# Patient Record
Sex: Female | Born: 1960 | Race: White | Hispanic: No | Marital: Married | Smoking: Former smoker
Health system: Southern US, Community
[De-identification: ages and names within clinical notes are randomized; demographics above are authoritative.]

## PROBLEM LIST (undated history)

## (undated) DIAGNOSIS — M1990 Unspecified osteoarthritis, unspecified site: Secondary | ICD-10-CM

## (undated) DIAGNOSIS — Z8739 Personal history of other diseases of the musculoskeletal system and connective tissue: Secondary | ICD-10-CM

## (undated) DIAGNOSIS — Z87442 Personal history of urinary calculi: Secondary | ICD-10-CM

## (undated) DIAGNOSIS — Z1501 Genetic susceptibility to malignant neoplasm of breast: Secondary | ICD-10-CM

## (undated) DIAGNOSIS — K5900 Constipation, unspecified: Secondary | ICD-10-CM

## (undated) DIAGNOSIS — Z806 Family history of leukemia: Secondary | ICD-10-CM

## (undated) DIAGNOSIS — F419 Anxiety disorder, unspecified: Secondary | ICD-10-CM

## (undated) DIAGNOSIS — Z8719 Personal history of other diseases of the digestive system: Secondary | ICD-10-CM

## (undated) DIAGNOSIS — Z1589 Genetic susceptibility to other disease: Secondary | ICD-10-CM

## (undated) DIAGNOSIS — G5600 Carpal tunnel syndrome, unspecified upper limb: Secondary | ICD-10-CM

## (undated) DIAGNOSIS — C801 Malignant (primary) neoplasm, unspecified: Secondary | ICD-10-CM

## (undated) DIAGNOSIS — Z800 Family history of malignant neoplasm of digestive organs: Secondary | ICD-10-CM

## (undated) HISTORY — DX: Constipation, unspecified: K59.00

## (undated) HISTORY — DX: Family history of malignant neoplasm of digestive organs: Z80.0

## (undated) HISTORY — DX: Anxiety disorder, unspecified: F41.9

## (undated) HISTORY — DX: Genetic susceptibility to other disease: Z15.89

## (undated) HISTORY — PX: FRACTURE SURGERY: SHX138

## (undated) HISTORY — DX: Personal history of other diseases of the musculoskeletal system and connective tissue: Z87.39

## (undated) HISTORY — DX: Family history of leukemia: Z80.6

## (undated) HISTORY — DX: Personal history of other diseases of the digestive system: Z87.19

## (undated) HISTORY — PX: OTHER SURGICAL HISTORY: SHX169

## (undated) HISTORY — DX: Genetic susceptibility to malignant neoplasm of breast: Z15.01

## (undated) HISTORY — PX: TUBAL LIGATION: SHX77

## (undated) HISTORY — PX: COLONOSCOPY: SHX174

---

## 1997-09-14 ENCOUNTER — Other Ambulatory Visit: Admission: RE | Admit: 1997-09-14 | Discharge: 1997-09-14 | Payer: Self-pay | Admitting: *Deleted

## 1998-04-09 ENCOUNTER — Other Ambulatory Visit: Admission: RE | Admit: 1998-04-09 | Discharge: 1998-04-09 | Payer: Self-pay | Admitting: *Deleted

## 1998-05-22 HISTORY — PX: LITHOTRIPSY: SUR834

## 1998-10-26 ENCOUNTER — Other Ambulatory Visit: Admission: RE | Admit: 1998-10-26 | Discharge: 1998-10-26 | Payer: Self-pay | Admitting: *Deleted

## 1998-12-16 ENCOUNTER — Ambulatory Visit (HOSPITAL_COMMUNITY): Admission: RE | Admit: 1998-12-16 | Discharge: 1998-12-16 | Payer: Self-pay | Admitting: *Deleted

## 1998-12-16 ENCOUNTER — Encounter (INDEPENDENT_AMBULATORY_CARE_PROVIDER_SITE_OTHER): Payer: Self-pay | Admitting: Specialist

## 1999-12-07 ENCOUNTER — Other Ambulatory Visit: Admission: RE | Admit: 1999-12-07 | Discharge: 1999-12-07 | Payer: Self-pay | Admitting: *Deleted

## 2000-12-10 ENCOUNTER — Other Ambulatory Visit: Admission: RE | Admit: 2000-12-10 | Discharge: 2000-12-10 | Payer: Self-pay | Admitting: *Deleted

## 2001-02-22 ENCOUNTER — Other Ambulatory Visit: Admission: RE | Admit: 2001-02-22 | Discharge: 2001-02-22 | Payer: Self-pay | Admitting: *Deleted

## 2001-02-22 ENCOUNTER — Encounter (INDEPENDENT_AMBULATORY_CARE_PROVIDER_SITE_OTHER): Payer: Self-pay | Admitting: Specialist

## 2001-04-21 ENCOUNTER — Inpatient Hospital Stay (HOSPITAL_COMMUNITY): Admission: EM | Admit: 2001-04-21 | Discharge: 2001-04-22 | Payer: Self-pay

## 2001-04-21 ENCOUNTER — Encounter (INDEPENDENT_AMBULATORY_CARE_PROVIDER_SITE_OTHER): Payer: Self-pay | Admitting: *Deleted

## 2001-12-10 ENCOUNTER — Other Ambulatory Visit: Admission: RE | Admit: 2001-12-10 | Discharge: 2001-12-10 | Payer: Self-pay | Admitting: *Deleted

## 2002-10-21 ENCOUNTER — Encounter: Admission: RE | Admit: 2002-10-21 | Discharge: 2002-10-29 | Payer: Self-pay | Admitting: Internal Medicine

## 2002-10-27 ENCOUNTER — Encounter: Payer: Self-pay | Admitting: Family Medicine

## 2002-10-27 ENCOUNTER — Encounter: Admission: RE | Admit: 2002-10-27 | Discharge: 2002-10-27 | Payer: Self-pay | Admitting: Family Medicine

## 2003-05-17 ENCOUNTER — Inpatient Hospital Stay (HOSPITAL_COMMUNITY): Admission: AD | Admit: 2003-05-17 | Discharge: 2003-05-18 | Payer: Self-pay | Admitting: *Deleted

## 2003-05-17 IMAGING — US US TRANSVAGINAL NON-OB
1 series · 18 of 25 positions shown · non-contrast
Comparison: none

CLINICAL DATA: Right-sided pain.  History of appendectomy, myomectomy.  History of renal calculi.
 RENAL ULTRASOUND, TRANSABDOMINAL AND TRANSVAGINAL PELVIC ULTRASOUND, [DATE]
 No comparison films available.
 RENAL ULTRASOUND
 Kidneys bilaterally are normal in size and echogenicity without focal solid masses.  There is mild to moderate right hydronephrosis with a few small right intrarenal nonobstructing calculi.  The cause of this hydronephrosis not delineated but may be from a distal ureteral calculus.
 IMPRESSION 
 Mild to moderate right hydronephrosis cause not delineated by this examination.  Consider further evaluation with CT or other imaging or evaluation as indicated.
 Small right intrarenal calculi.
 PELVIC ULTRASOUND
 Transvaginal and transabdominal ultrasound of the pelvis is performed.  The uterus is retroverted measuring 9.2 x 4.4 x 5.4 cm.  No focal uterine masses are identified except for several nabothian cysts.  Endometrial stripe is homogeneous measuring 6 mm in greatest diameter.  Ovaries bilaterally are unremarkable.  Blood flow seen to both ovaries.  No solid adnexal masses or free fluid.
 Retroverted uterus.  Several nabothian cysts.  Otherwise unremarkable pelvic ultrasound.
 [REDACTED]

[Series 1: us transvaginal non-ob · 18 of 60 slices shown]
[im 1/60]
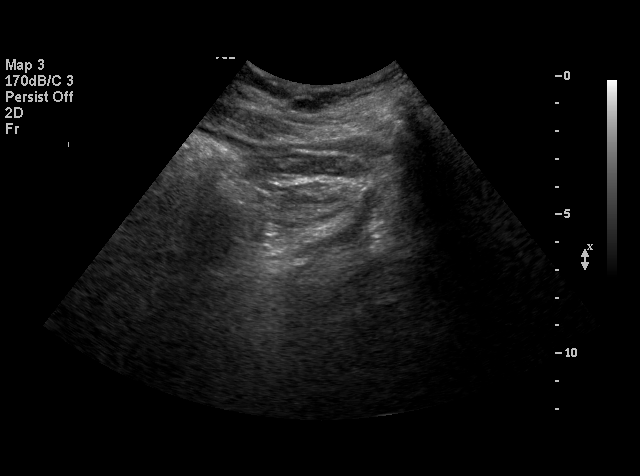
[im 5/60]
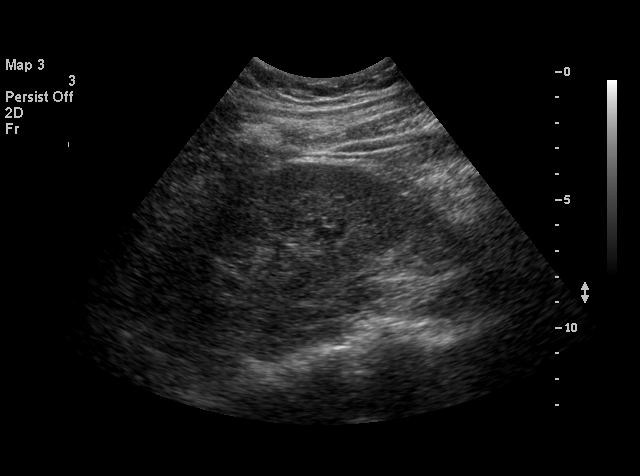
[im 8/60]
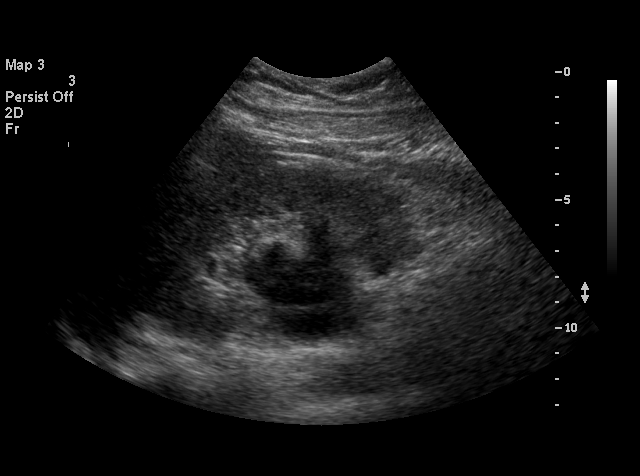
[im 10/60]
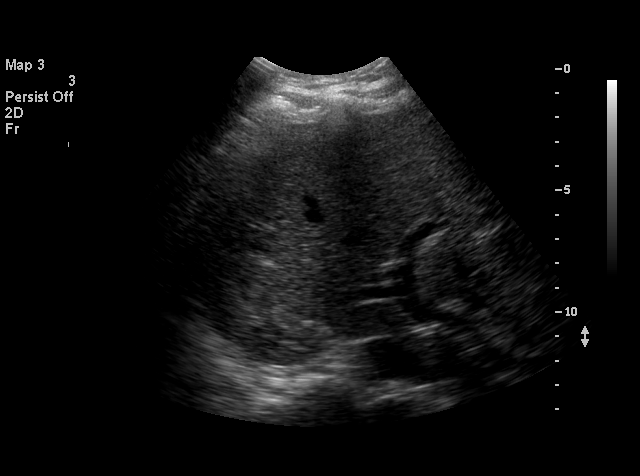
[im 15/60]
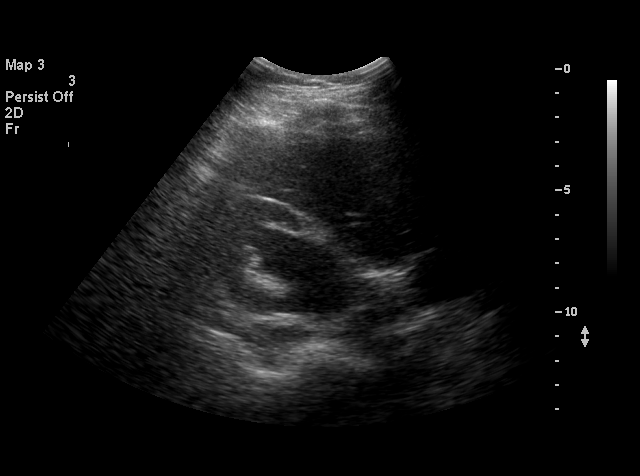
[im 18/60]
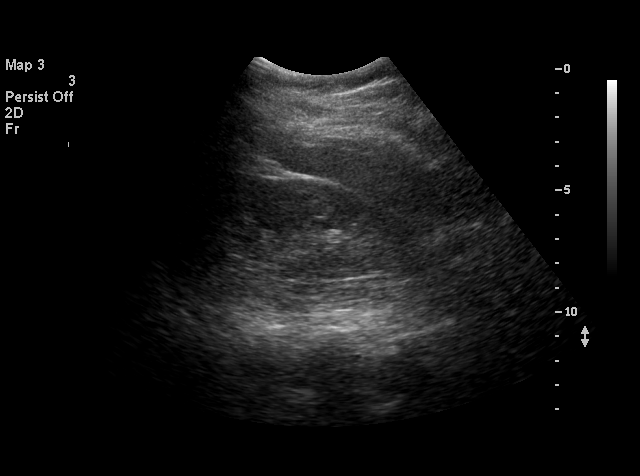
[im 23/60]
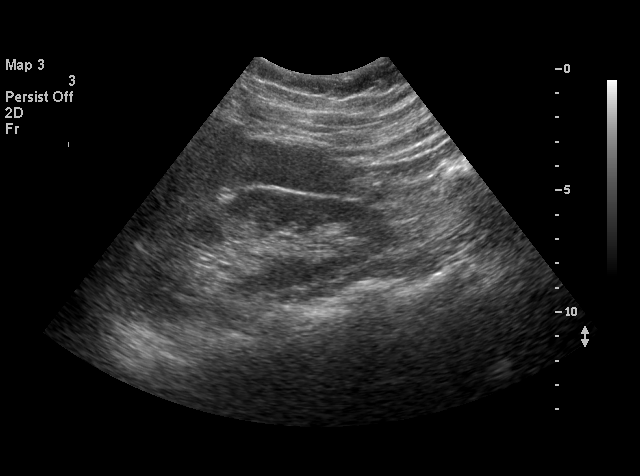
[im 25/60]
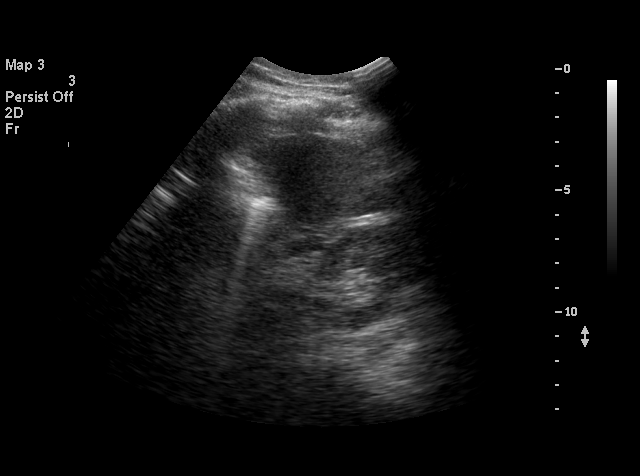
[im 28/60]
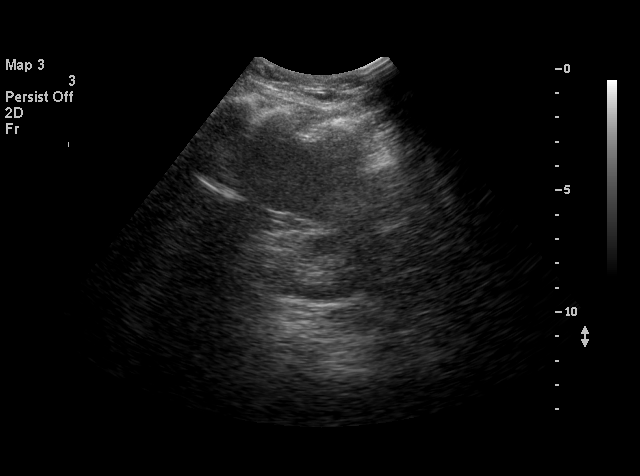
[im 32/60]
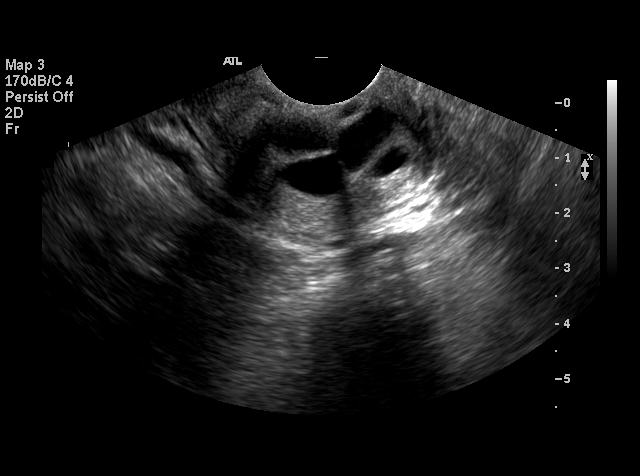
[im 35/60]
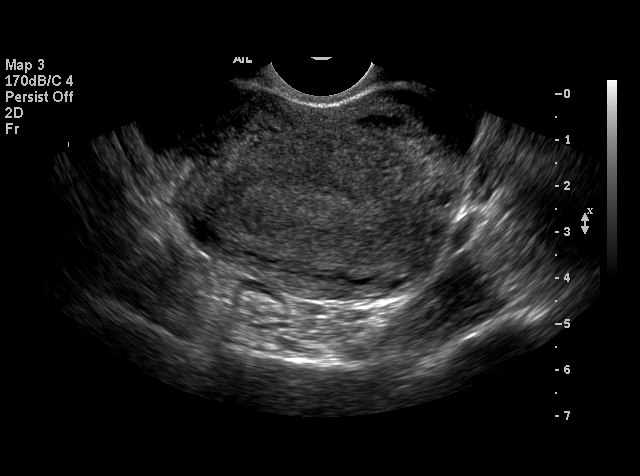
[im 37/60]
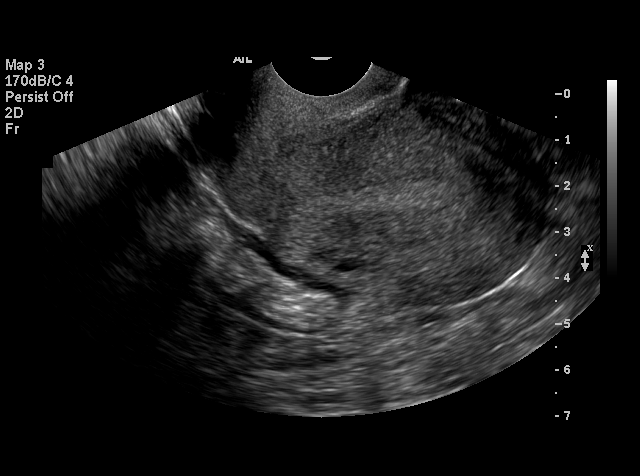
[im 42/60]
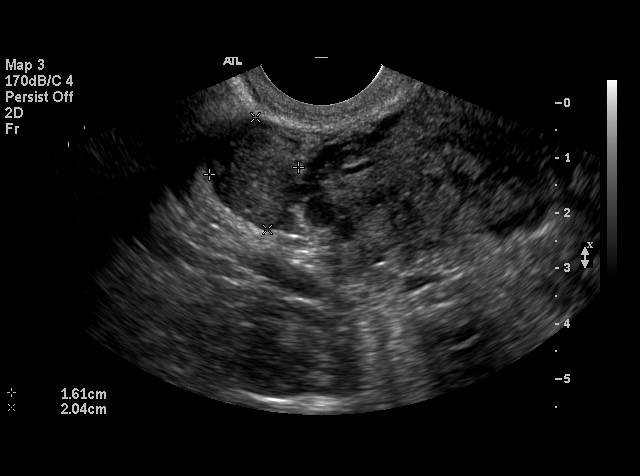
[im 45/60]
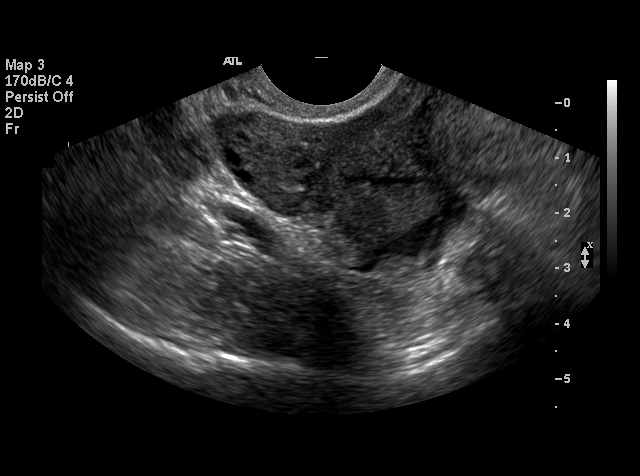
[im 50/60]
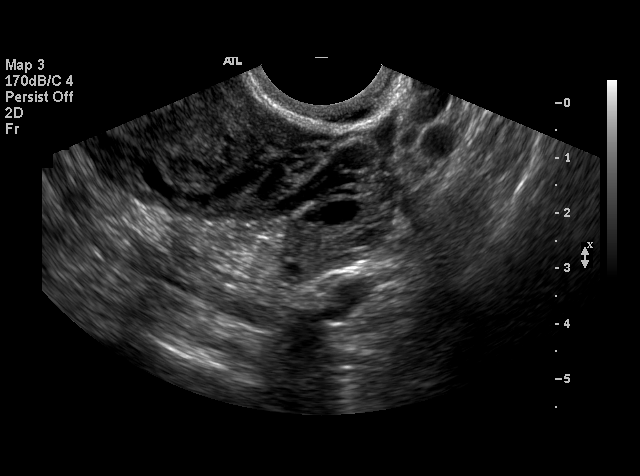
[im 52/60]
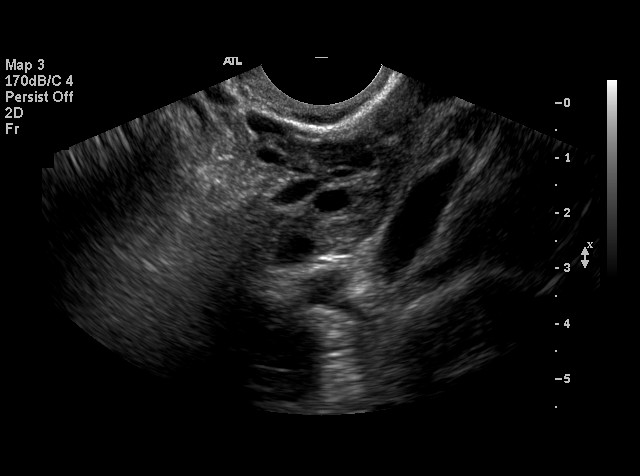
[im 55/60]
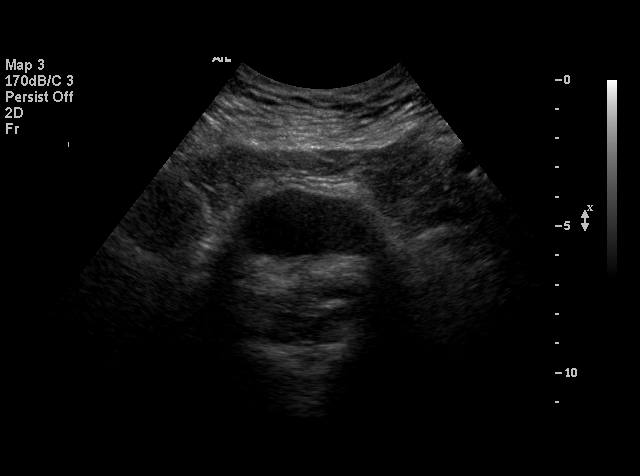
[im 60/60]
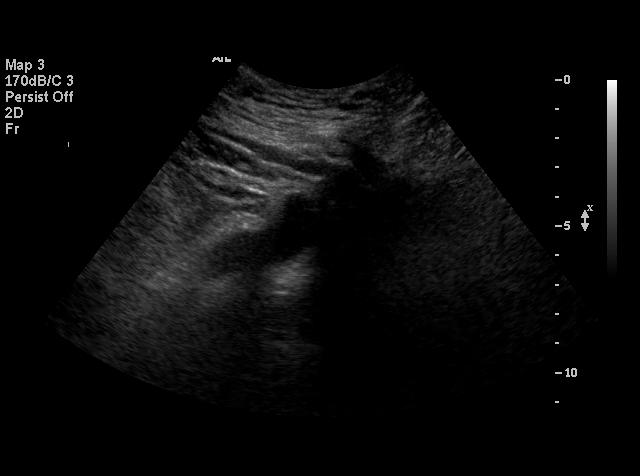

[18 of 25 positions shown; findings below may reference images not displayed]

## 2003-05-18 ENCOUNTER — Emergency Department (HOSPITAL_COMMUNITY): Admission: EM | Admit: 2003-05-18 | Discharge: 2003-05-18 | Payer: Self-pay | Admitting: Emergency Medicine

## 2003-05-19 ENCOUNTER — Observation Stay (HOSPITAL_COMMUNITY): Admission: AD | Admit: 2003-05-19 | Discharge: 2003-05-20 | Payer: Self-pay | Admitting: Urology

## 2003-06-08 ENCOUNTER — Other Ambulatory Visit: Admission: RE | Admit: 2003-06-08 | Discharge: 2003-06-08 | Payer: Self-pay | Admitting: *Deleted

## 2004-09-11 ENCOUNTER — Emergency Department (HOSPITAL_COMMUNITY): Admission: EM | Admit: 2004-09-11 | Discharge: 2004-09-11 | Payer: Self-pay | Admitting: Emergency Medicine
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
## 2004-10-26 ENCOUNTER — Encounter: Admission: RE | Admit: 2004-10-26 | Discharge: 2005-01-24 | Payer: Self-pay | Admitting: Orthopedic Surgery

## 2006-10-16 ENCOUNTER — Other Ambulatory Visit: Admission: RE | Admit: 2006-10-16 | Discharge: 2006-10-16 | Payer: Self-pay | Admitting: *Deleted

## 2007-01-18 ENCOUNTER — Ambulatory Visit (HOSPITAL_COMMUNITY): Admission: RE | Admit: 2007-01-18 | Discharge: 2007-01-18 | Payer: Self-pay | Admitting: Family Medicine

## 2009-12-28 ENCOUNTER — Encounter: Admission: RE | Admit: 2009-12-28 | Discharge: 2009-12-28 | Payer: Self-pay | Admitting: Family Medicine

## 2009-12-28 IMAGING — US US TRANSVAGINAL NON-OB
1 series · 13 of 25 positions shown · non-contrast
Comparison: Prior examination [DATE].

CLINICAL DATA: Dysfunctional uterine bleeding. History of uterine
fibroid.  LMP [DATE]; not on hormone replacement therapy.



[Series 1: us transvaginal non-ob · 0.33mm/px · 13 of 50 slices shown]
[im 1/50]
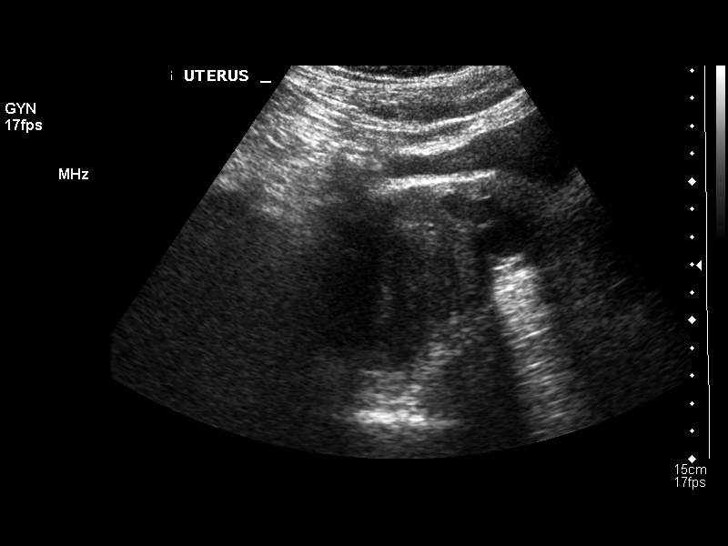
[im 5/50]
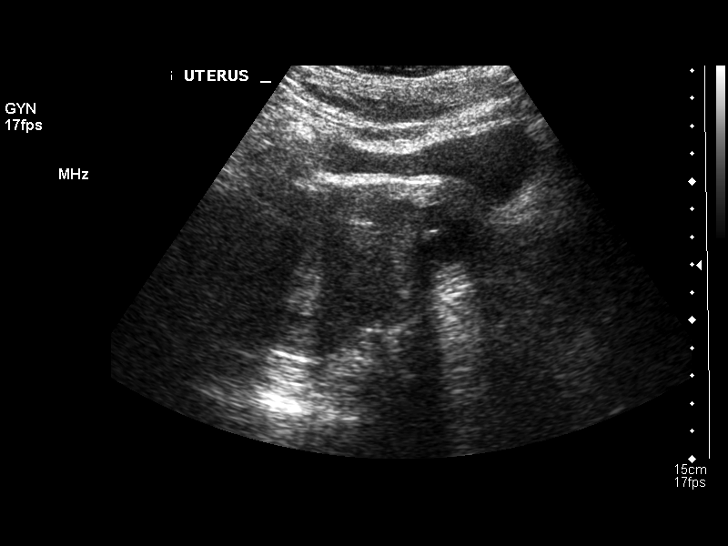
[im 9/50]
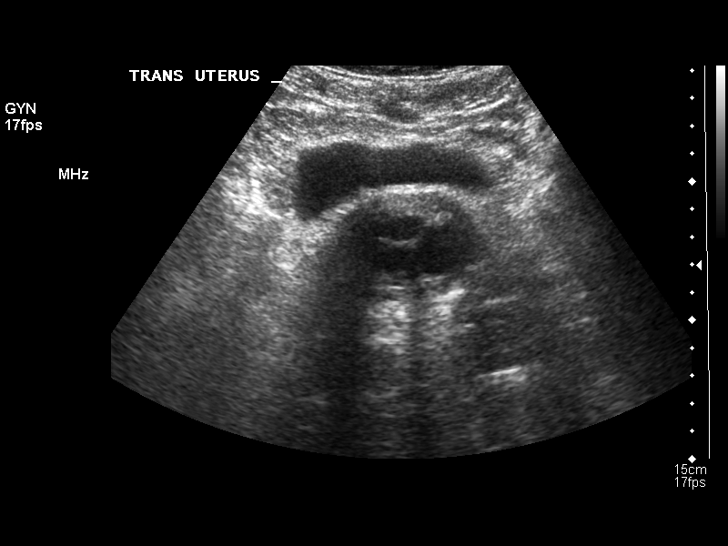
[im 13/50]
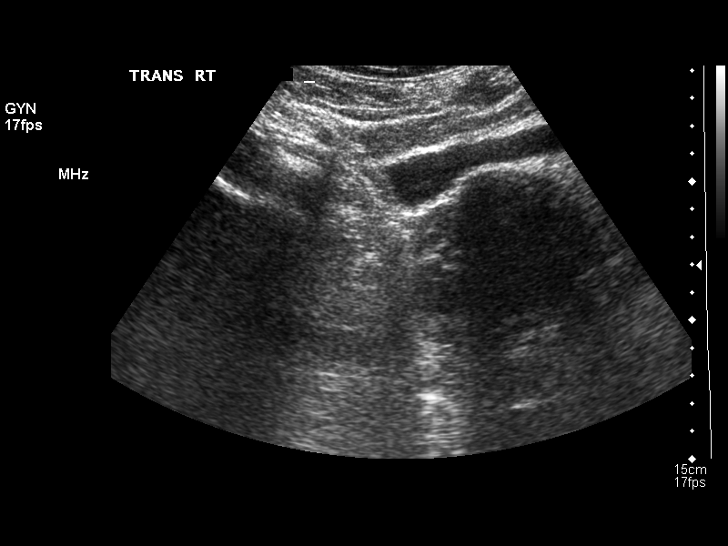
[im 17/50]
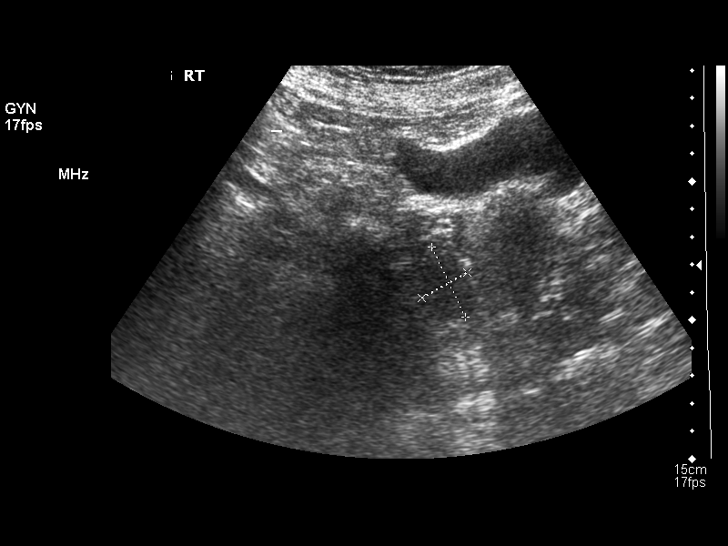
[im 21/50]
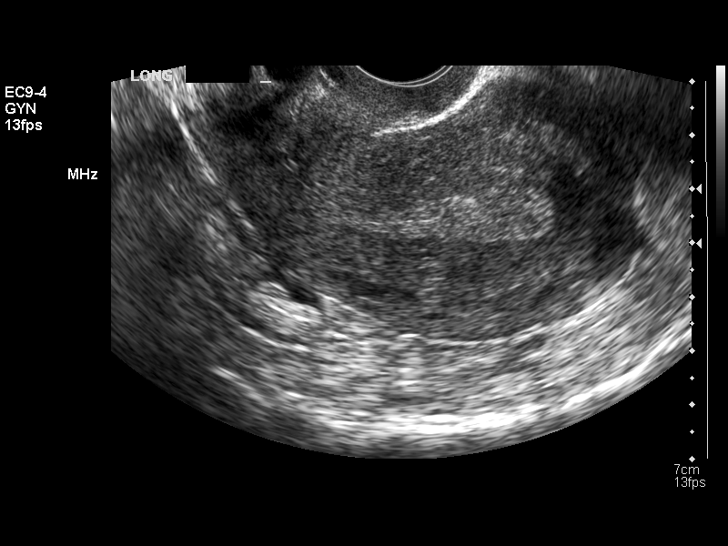
[im 25/50]
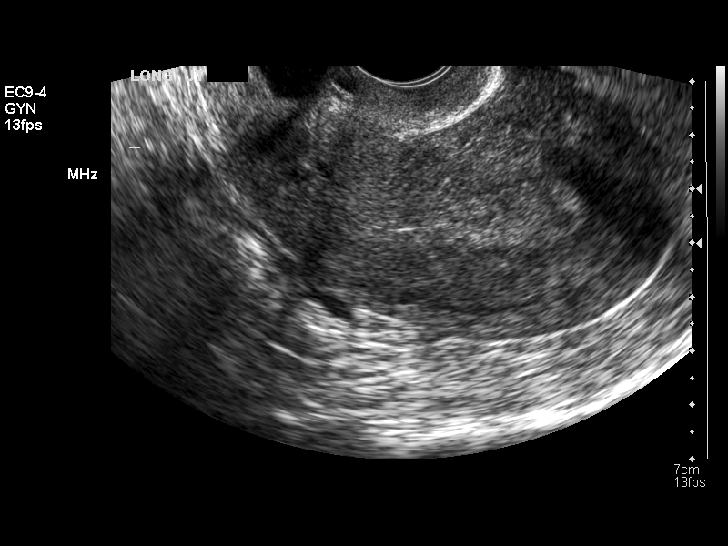
[im 29/50]
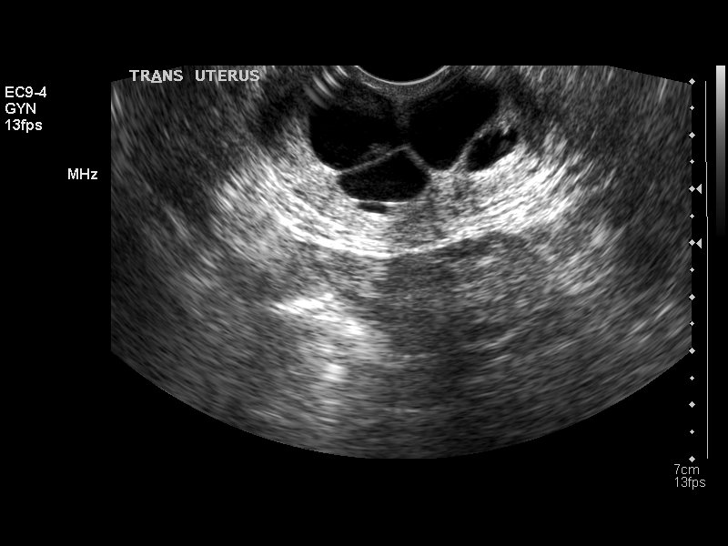
[im 33/50]
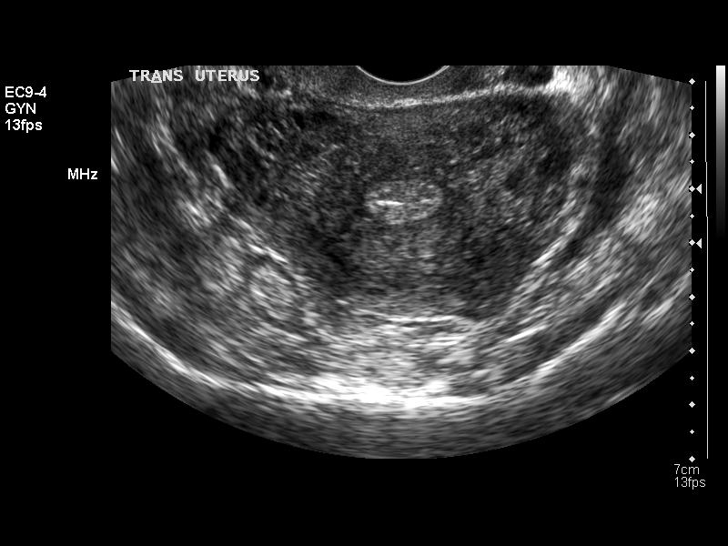
[im 37/50]
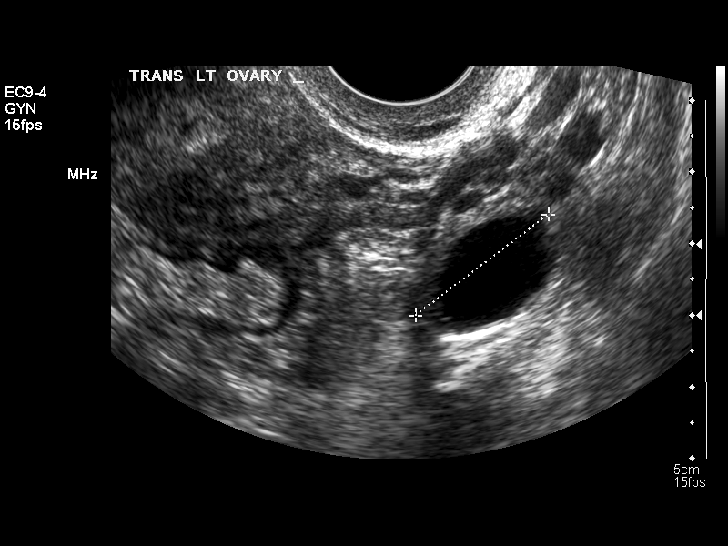
[im 41/50]
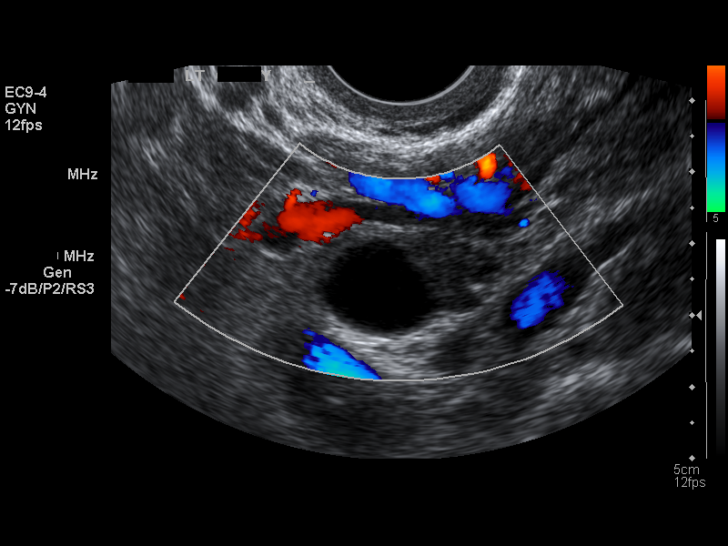
[im 45/50]
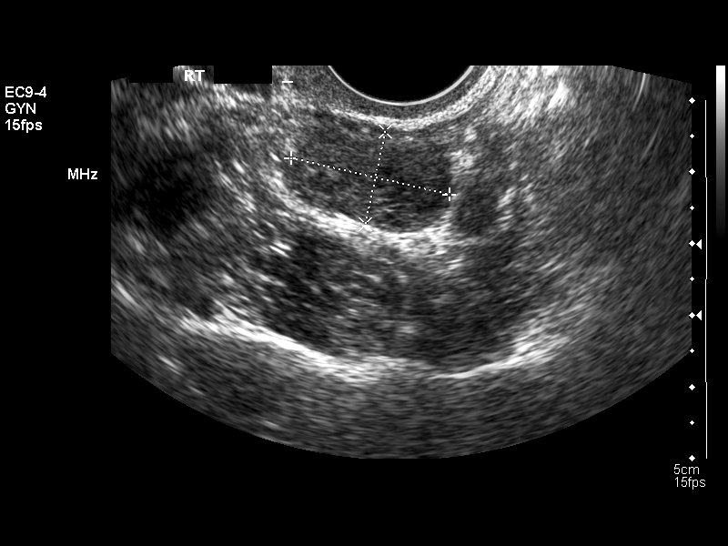
[im 50/50]
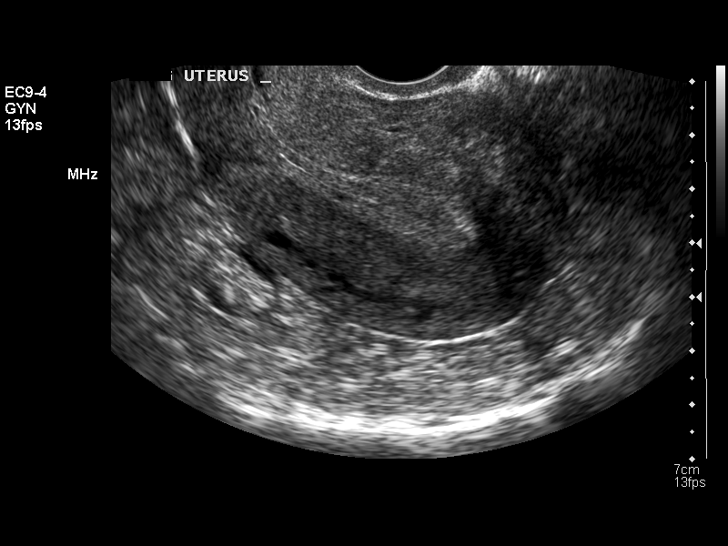

[13 of 25 positions shown; findings below may reference images not displayed]

FINDINGS: Uterus measures 9.9 x 4.9 x 5.7 cm.  The uterus is retroverted.
There are multiple cervical Nabothian cysts.  No myometrial
abnormalities are identified.

Endometrium measures 10.6 mm in thickness.

Right Ovary measures 2.3 x 1.3 x 2.0 cm and appears normal.

Left Ovary measures 2.6 x 1.5 x 2.3 cm and contains a small
follicle measuring 1.9 cm in greatest dimension.

Other Findings:  None.  There is no free pelvic fluid.
IMPRESSION: 1.  Nonspecific mild prominence of the endometrium, within
physiologic limits for a premenopausal patient.
2.  No myometrial or suspicious adnexal findings.
3.  Multiple cervical Nabothian cysts as before.

## 2010-09-27 ENCOUNTER — Other Ambulatory Visit: Payer: Self-pay | Admitting: Family Medicine

## 2010-09-27 ENCOUNTER — Other Ambulatory Visit (HOSPITAL_COMMUNITY)
Admission: RE | Admit: 2010-09-27 | Discharge: 2010-09-27 | Disposition: A | Discharge status: Home / Self Care | Payer: Medicaid Other | Source: Ambulatory Visit | Attending: Family Medicine | Admitting: Family Medicine

## 2010-09-27 DIAGNOSIS — Z01419 Encounter for gynecological examination (general) (routine) without abnormal findings: Secondary | ICD-10-CM | POA: Insufficient documentation

## 2010-10-07 NOTE — Op Note (Signed)
NAMEDORICE, STIGGERS                          ACCOUNT NO.:  [id number]   MEDICAL RECORD NO.:  [id number]                   PATIENT TYPE:  OBV   LOCATION:  0359                                 FACILITY:  [facility]   PHYSICIAN:  Lindaann Slough, M.D.               DATE OF BIRTH:  1960-07-21   DATE OF PROCEDURE:  05/19/2003  DATE OF DISCHARGE:                                 OPERATIVE REPORT   PREOPERATIVE DIAGNOSIS:  Right hydronephrosis.   POSTOPERATIVE DIAGNOSIS:  1. Right hydronephrosis.  2. Right ureteral stone.   OPERATION/PROCEDURE:  1. Cystoscopy.  2. Right retrograde pyelogram.  3. Ureteroscopy with holmium laser, right distal ureteral stone with stone     extraction.  4. Insertion of double-J catheter.   SURGEON:  Lindaann Slough, M.D.   ANESTHESIA:  General.   INDICATIONS:  The patient is a 51 year old female who has been complaining  of right flank and right lower quadrant pain on and off for the past two to  three years.  The pain became severe about a week ago and she went to the  emergency room at [facility] and a renal ultrasound showed right  hydronephrosis.  She was sent home with analgesics and the pain recurred two  days ago and she was seen in the emergency room at [facility] and was then  referred to the office for further evaluation.  A CT scan of the abdomen and  pelvis showed right hydronephrosis and a calcification in the pelvis that  appeared to be outside of the ureter.  Therefore, a CT scan with IV and oral  contrast was done and showed marked right hydronephrosis without definite  evidence of obstruction and it was difficult to see if the calcification in  the pelvis is in the course of the ureter.  There was no contrast from the  right kidney.  The patient was then scheduled for cystoscopy and right  retrograde pyelogram.   DESCRIPTION OF PROCEDURE:  Under general anesthesia, the patient was prepped  and draped and placed in the dorsal  lithotomy position.  A 22 Wappler  cystoscope was inserted in the bladder.  The bladder mucosa is normal.  There is no stone or tumor in the bladder.  The ureteral orifices are in  normal position and shape.  A cone-tip catheter was then passed through the  cystoscope and into the right ureter.  Contrast was then injected through  the cone-tip catheter.  The distal ureter is normal but the mid and upper  ureter are dilated as well as the collecting system.  There was no definite  evidence of filling defect in the ureter.  The cone-tip catheter was then  removed.  A glide wire was then passed through the cystoscope into the right  ureter.  The cystoscope was then removed.  A 6-French rigid ureteroscope was  then passed in the bladder and into the ureter.  The stone at the junction  of the mid and distal ureter.  The stone was then fragmented with the  holmium laser and the stone fragments were removed with nitinol stone  basket.  The ureteroscope was then reinserted in the ureter and there was no  evidence of ureteral injury.  Contrast was then injected through the  ureteroscope and there was no evidence of extravasation of contrast.  The  ureteroscope was then removed.  The glide wire was backloaded into the  cystoscope and an 8-French, 24 double-J catheter was passed over the glide  wire.  The proximal curl of the double-J catheter is in the collecting  system.  The distal curl is in the bladder.  The bladder was then emptied  and the cystoscope and glide wire were removed.   The patient tolerated the procedure well and left the OR in satisfactory  condition to post anesthesia care unit.                                               Lindaann Slough, M.D.    MN/MEDQ  D:  05/19/2003  T:  05/19/2003  Job:  [id number]

## 2010-10-07 NOTE — Discharge Summary (Signed)
NAMESHARUNDA, SALMON                          ACCOUNT NO.:  [id number]   MEDICAL RECORD NO.:  [id number]                   PATIENT TYPE:  OBV   LOCATION:  0359                                 FACILITY:  [facility]   PHYSICIAN:  Lindaann Slough, M.D.               DATE OF BIRTH:  [birth year]   DATE OF ADMISSION:  05/19/2003  DATE OF DISCHARGE:  05/20/2003                                 DISCHARGE SUMMARY   DISCHARGE DIAGNOSES:  1. Right ureteral stone.  2. Right hydronephrosis.  3. Urosepsis.   PROCEDURES:  Cystoscopy, right retrograde pyelogram, stone extraction, and  insertion of double-J catheter on May 19, 2003.   HISTORY OF PRESENT ILLNESS:  The patient is a 50 year old female who had  been complaining of right flank pain for two to three years.  She had a CAT  scan of the abdomen and pelvis three years ago before appendectomy and was  told she had a right kidney stone.  She has never passed the stone.  About a  week ago she started having severe pain, and was seen at [facility],  and a renal ultrasound showed a right hydronephrosis.  A CT scan of the  abdomen and pelvis showed right hydronephrosis and calcification in the bony  pelvis without definite evidence of ureteral stone.  CT scan with IV  contrast showed hydronephrosis, no contrast from the right kidney, and it  was difficult to see if the calcification in the pelvis was in the ureter.  Cystoscopy and retrograde pyelogram and ureteroscopy on May 19, 2003,  showed a stone in the distal ureter.  The stone was extracted and she had a  double-J catheter left in place after the procedure.  She spiked a  temperature to 101.2 yesterday, and she was admitted for observation after  the procedure.  She does not have any flank pain today.  She has a slight  discomfort.  She is voiding well.  Her urine is slightly bloody.   Her hemoglobin on admission was 11.9, hematocrit was 34.2, and WBC 14.1.   PHYSICAL  EXAMINATION:  VITAL SIGNS:  Her temperature today is 100.6, blood  pressure 91/48, pulse 98, respirations 18.  ABDOMEN:  Soft, nondistended, she has mild flank tenderness, and her bladder  is not distended.   She was tolerating her diet well.  She was then discharged home on Demerol  50 mg q.6h. p.r.n. for pain, Keflex 500 mg p.o. q.6h.   CONDITION ON DISCHARGE:  Improved.   DISCHARGE DIET:  Regular.   ACTIVITY:  She may resume her pre-hospital activities.   FOLLOWUP:  She will be followed in the office in two weeks.  We will then  remove the double-J catheter.  We will also send the stone for stone  analysis.  Lindaann Slough, M.D.    MN/MEDQ  D:  05/20/2003  T:  05/20/2003  Job:  [id number]

## 2010-10-07 NOTE — Op Note (Signed)
[place]. [facility]  Patient:    Beverly Mcclain, Beverly Mcclain Visit Number: [id number] MRN: [id number]          Service Type: SUR Location: [id number] Attending Physician:  Delsa Bern Dictated by:   Lorne Skeens. Hoxworth, M.D. Proc. Date: 04/21/01 Admit Date:  04/21/2001                             Operative Report  PREOPERATIVE DIAGNOSIS:  Acute appendicitis.  POSTOPERATIVE DIAGNOSIS:  Acute appendicitis.  PROCEDURE:  Laparoscopic appendectomy.  SURGEON:  Lorne Skeens. Hoxworth, M.D.  ANESTHESIA:  General.  BRIEF HISTORY:  Ms. [name] is a 50 year old white female who presents with signs and symptoms consistent with acute appendicitis, and CT scan obtained through the emergency room has been read as consistent with acute appendicitis.  Laparoscopy appendectomy has been recommended and accepted. The nature of the procedure, indications, risks of bleeding and infection were discussed and understood preoperatively.  She is now going to the operating room for this procedure.  DESCRIPTION OF OPERATION:  The patient was brought to the operating room and placed in the supine position on the operating table, and general endotracheal anesthesia was induced.  She had been given preoperative IV antibiotics.  The abdomen was sterilely prepped and draped.  Foley catheter was placed.  Local anesthesia was used to infiltrate the trocar sites prior to the incisions.  A 1-cm incision was made in the umbilicus.  Dissection was carried down to the midline fascia.  This was sharply incised for 1 cm.  The peritoneum was entered under direct vision.  Through a mattress suture of 0 Vicryl, the Hasson trocar was placed and pneumoperitoneum established.  Under direct vision, the 5-mm trocar was placed in the right upper quadrant, and a 12-mm trocar in the left lower quadrant.  There was a small amount of cloudy fluid in the pelvis that was suctioned.  The omentum was  pulled back off the cecum and appendix, and the cecum was seen to be acutely inflamed with exudate but no gangrene or perforation.  The appendix was freed from filmy inflammatory adhesions, and the base of the appendix/mesoappendix well exposed.  The mesoappendix was dissected away from the appendix at its base.  It was divided with two firings of the Endo GIA vascular stapler, and hemostasis assured. Following this, the appendix was divided at its base with a single firing of the 3.5-mm Endo GIA stapler.  The staples lines were inspected and were intact with no bleeding.  The right lower quadrant was thoroughly irrigated and suctioned until clear.  The appendix was placed in an EndoCatch bag and brought out through the umbilicus.  Trocars were removed under direct vision, and CO2 evacuated from the peritoneal cavity.  Pursestring suture was secured at the umbilicus.  Skin incisions were closed with interrupted subcuticular 4-0 Monocryl and Steri-Strips.  Sponge and needle counts were correct. Pressure dressings were applied, and the patient taken to recovery in good condition. Dictated by:   Lorne Skeens. Hoxworth, M.D. Attending Physician:  Delsa Bern DD:  04/21/01 TD:  04/21/01 Job: [id number]

## 2017-01-17 ENCOUNTER — Other Ambulatory Visit: Payer: Self-pay | Admitting: Family Medicine

## 2017-01-17 ENCOUNTER — Other Ambulatory Visit (HOSPITAL_COMMUNITY)
Admission: RE | Admit: 2017-01-17 | Discharge: 2017-01-17 | Disposition: A | Discharge status: Home / Self Care | Payer: Medicaid Other | Source: Ambulatory Visit | Attending: Family Medicine | Admitting: Family Medicine

## 2017-01-17 DIAGNOSIS — Z124 Encounter for screening for malignant neoplasm of cervix: Secondary | ICD-10-CM | POA: Insufficient documentation

## 2017-01-19 LAB — CYTOLOGY - PAP: Diagnosis: NEGATIVE

## 2019-09-05 ENCOUNTER — Other Ambulatory Visit: Payer: Self-pay | Admitting: Family Medicine

## 2019-09-05 DIAGNOSIS — R1011 Right upper quadrant pain: Secondary | ICD-10-CM

## 2019-09-09 ENCOUNTER — Ambulatory Visit
Admission: RE | Admit: 2019-09-09 | Discharge: 2019-09-09 | Disposition: A | Discharge status: Home / Self Care | Payer: 59 | Source: Ambulatory Visit | Attending: Family Medicine | Admitting: Family Medicine

## 2019-09-09 DIAGNOSIS — R1011 Right upper quadrant pain: Secondary | ICD-10-CM

## 2019-09-09 IMAGING — US US ABDOMEN COMPLETE
1 series · 13 of 25 positions shown · non-contrast
Comparison: Ultrasound dated [DATE]

CLINICAL DATA: Right upper quadrant abdominal pain and right flank
pain.

EXAM:
ABDOMEN ULTRASOUND COMPLETE

[Series 1: us abdomen complete · 0.23mm/px · 13 of 136 slices shown]
[im 1/136]
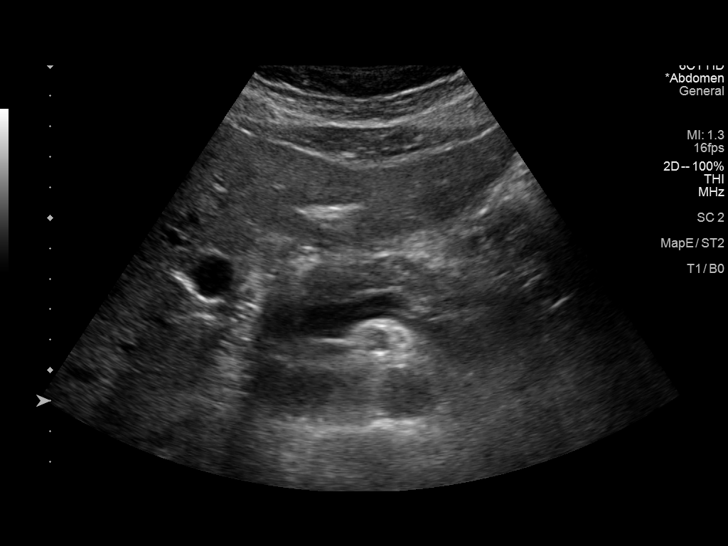
[im 12/136]
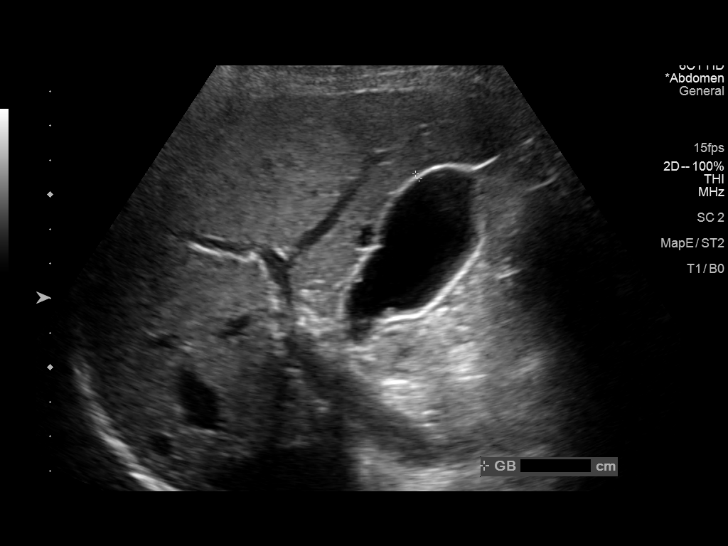
[im 23/136]
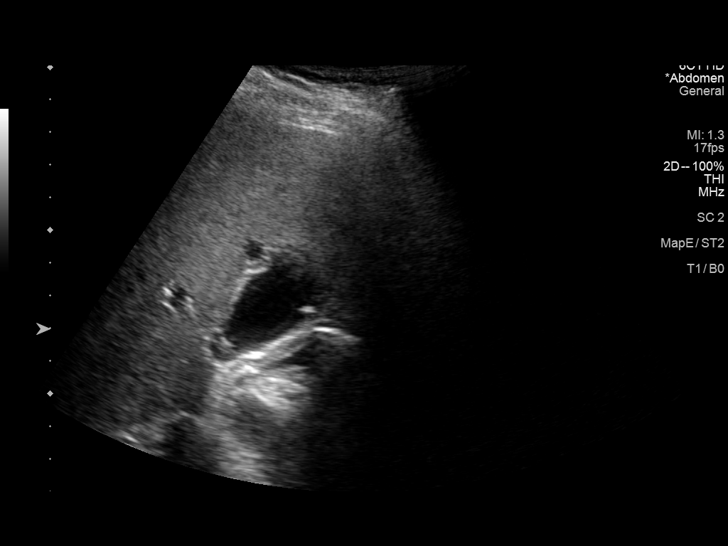
[im 34/136]
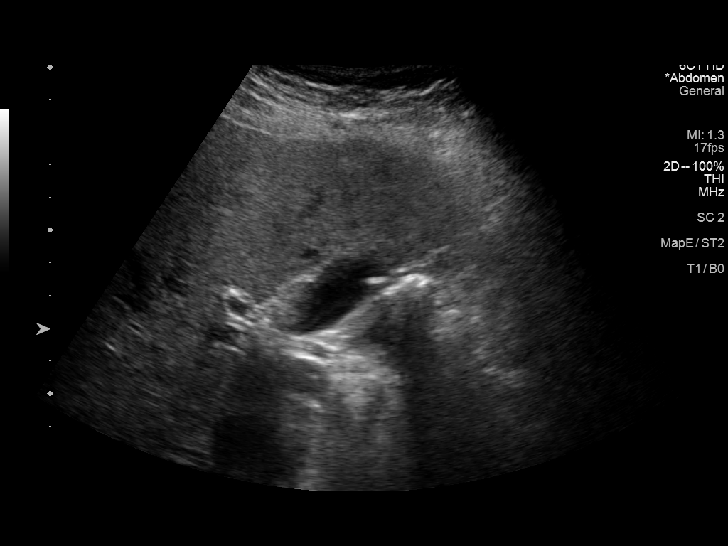
[im 46/136]
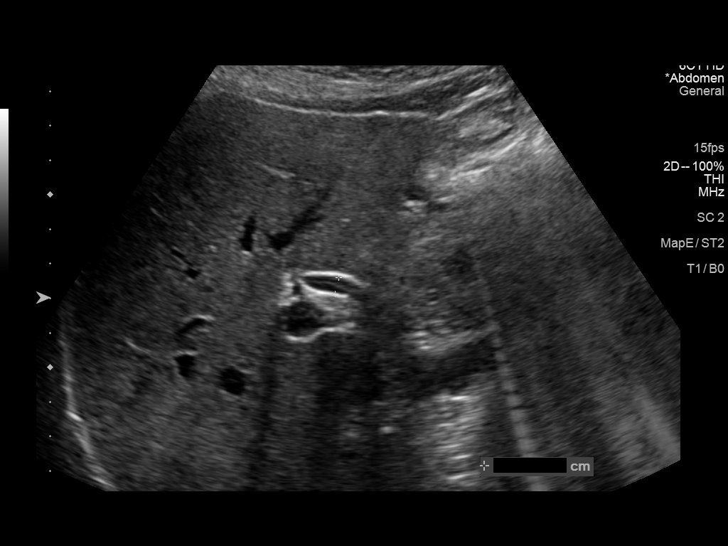
[im 57/136]
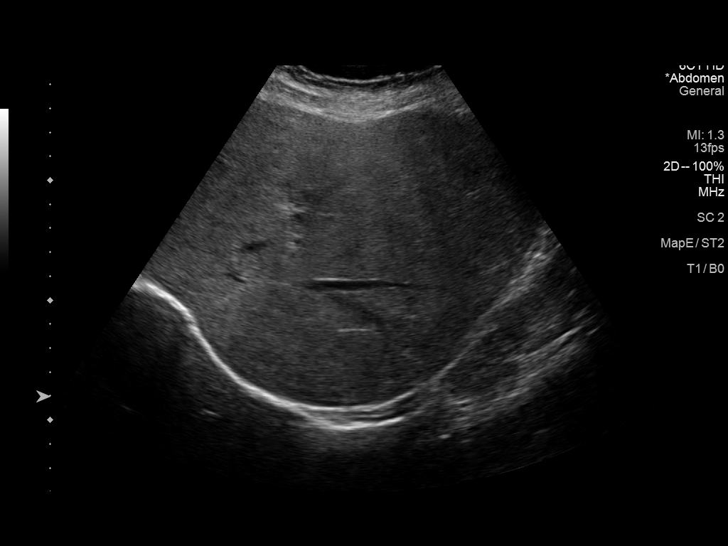
[im 68/136]
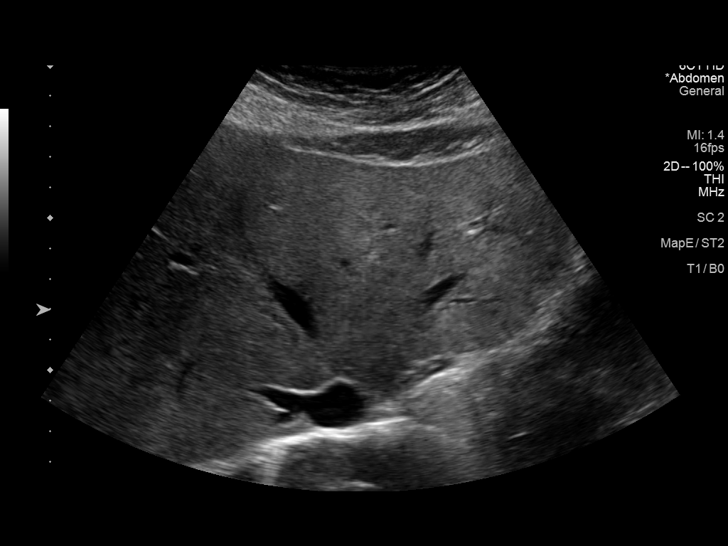
[im 79/136]
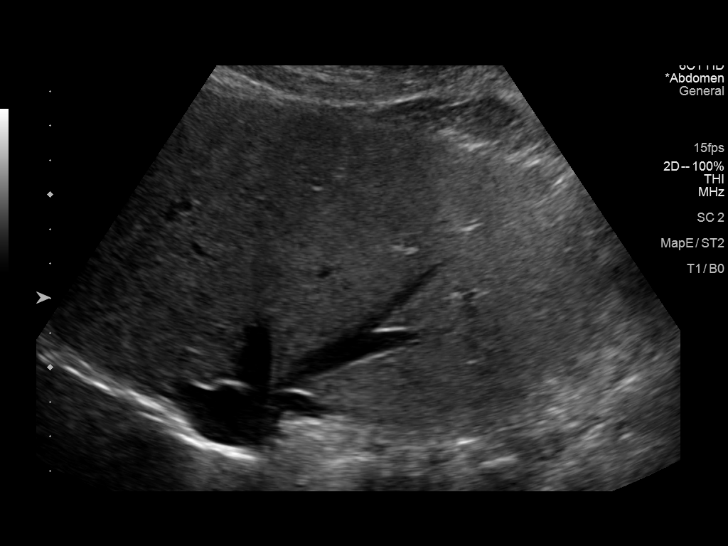
[im 91/136]
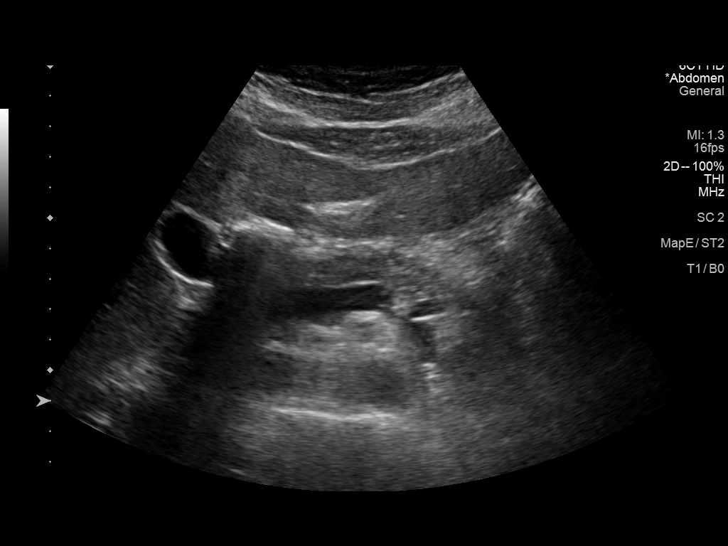
[im 102/136]
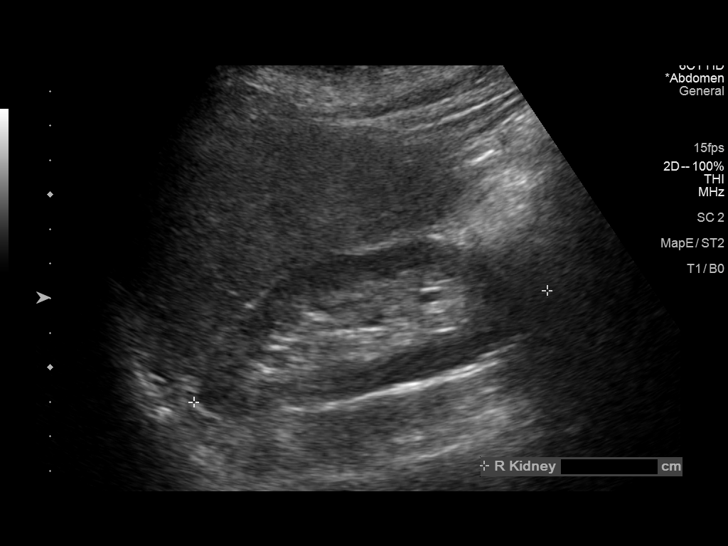
[im 113/136]
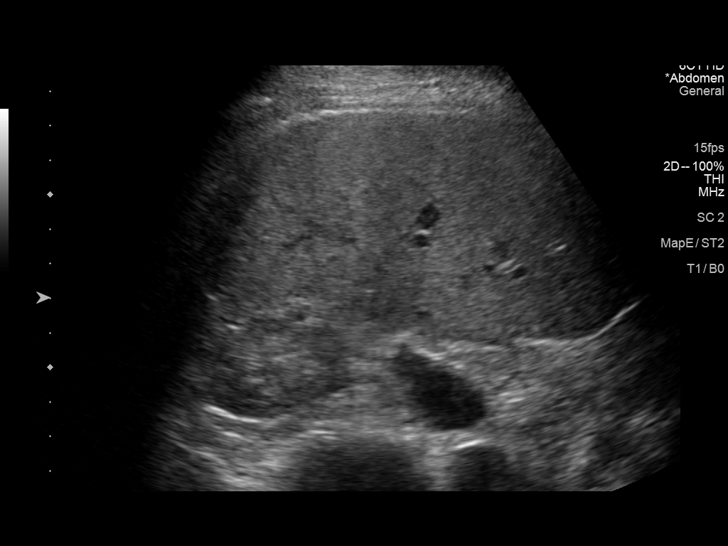
[im 124/136]
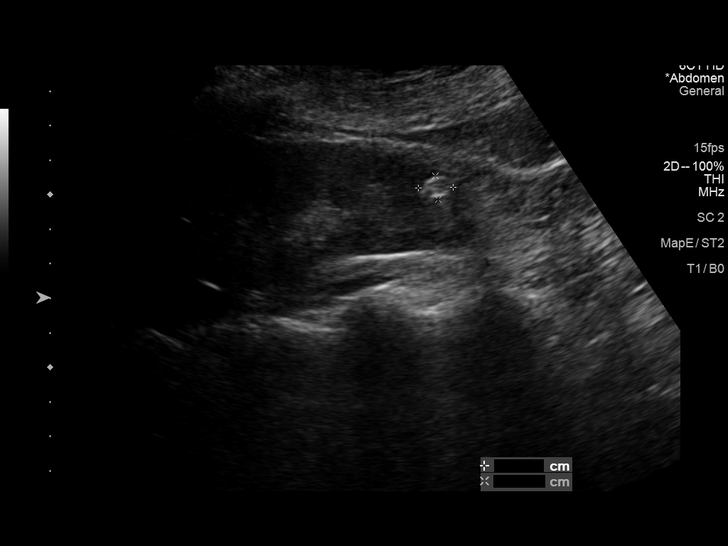
[im 136/136]
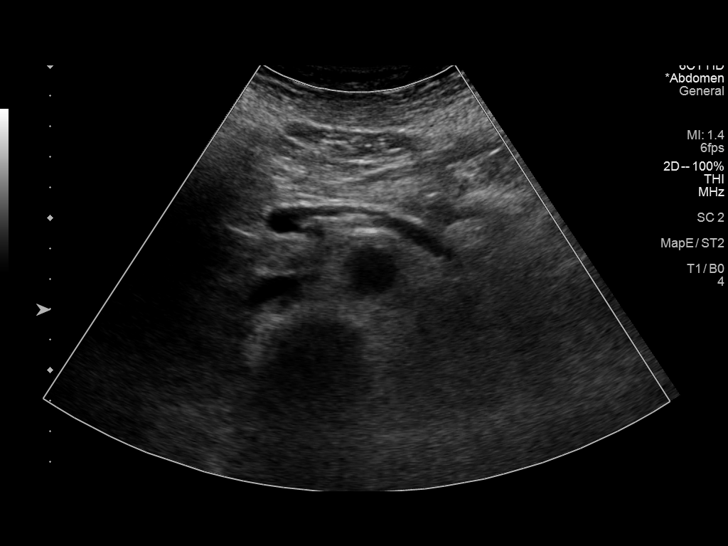

[13 of 25 positions shown; findings below may reference images not displayed]

FINDINGS: Gallbladder: There are 2 small polyps in the gallbladder, unchanged
since the prior study. No gallstones or wall thickening visualized.
No sonographic Murphy sign noted by sonographer.

Common bile duct: Diameter: 4.8 mm, normal.

Liver: No focal lesion identified. Within normal limits in
parenchymal echogenicity. Portal vein is patent on color Doppler
imaging with normal direction of blood flow towards the liver.

IVC: No abnormality visualized.

Pancreas: Visualized portion unremarkable.

Spleen: Size and appearance within normal limits.

Right Kidney: Length: 10.7 cm. Echogenicity within normal limits. No
mass or hydronephrosis visualized.

Left Kidney: Length: 10.5 cm. Echogenicity within normal limits.
There is a 10 mm well-defined echogenic lesion in the lower pole the
left kidney which was not visible on the prior exam. This may
represent a tiny angiomyolipoma.

Abdominal aorta: No aneurysm visualized.

Other findings: None.
IMPRESSION: 1. No acute abnormality.
2. Chronic small gallbladder polyps, unchanged since [DATE] cm echogenic lesion in the lower pole of the left kidney, new
since the prior study. This may represent a tiny angiomyolipoma but
I cannot exclude other neoplasm. Therefore, follow-up left renal
ultrasound in 6 months is recommended to ensure ongoing stability.

## 2019-10-30 ENCOUNTER — Other Ambulatory Visit: Payer: Self-pay | Admitting: Family Medicine

## 2019-10-30 ENCOUNTER — Other Ambulatory Visit: Payer: Self-pay

## 2019-10-30 ENCOUNTER — Ambulatory Visit
Admission: RE | Admit: 2019-10-30 | Discharge: 2019-10-30 | Disposition: A | Discharge status: Home / Self Care | Payer: 59 | Source: Ambulatory Visit | Attending: Family Medicine | Admitting: Family Medicine

## 2019-10-30 DIAGNOSIS — Z1231 Encounter for screening mammogram for malignant neoplasm of breast: Secondary | ICD-10-CM

## 2019-10-30 IMAGING — MG DIGITAL SCREENING BILAT W/ CAD
4 series · 4 of 4 positions shown · non-contrast
Comparison: None.

CLINICAL DATA: Screening.

EXAM:
DIGITAL SCREENING BILATERAL MAMMOGRAM WITH CAD

[L CC]
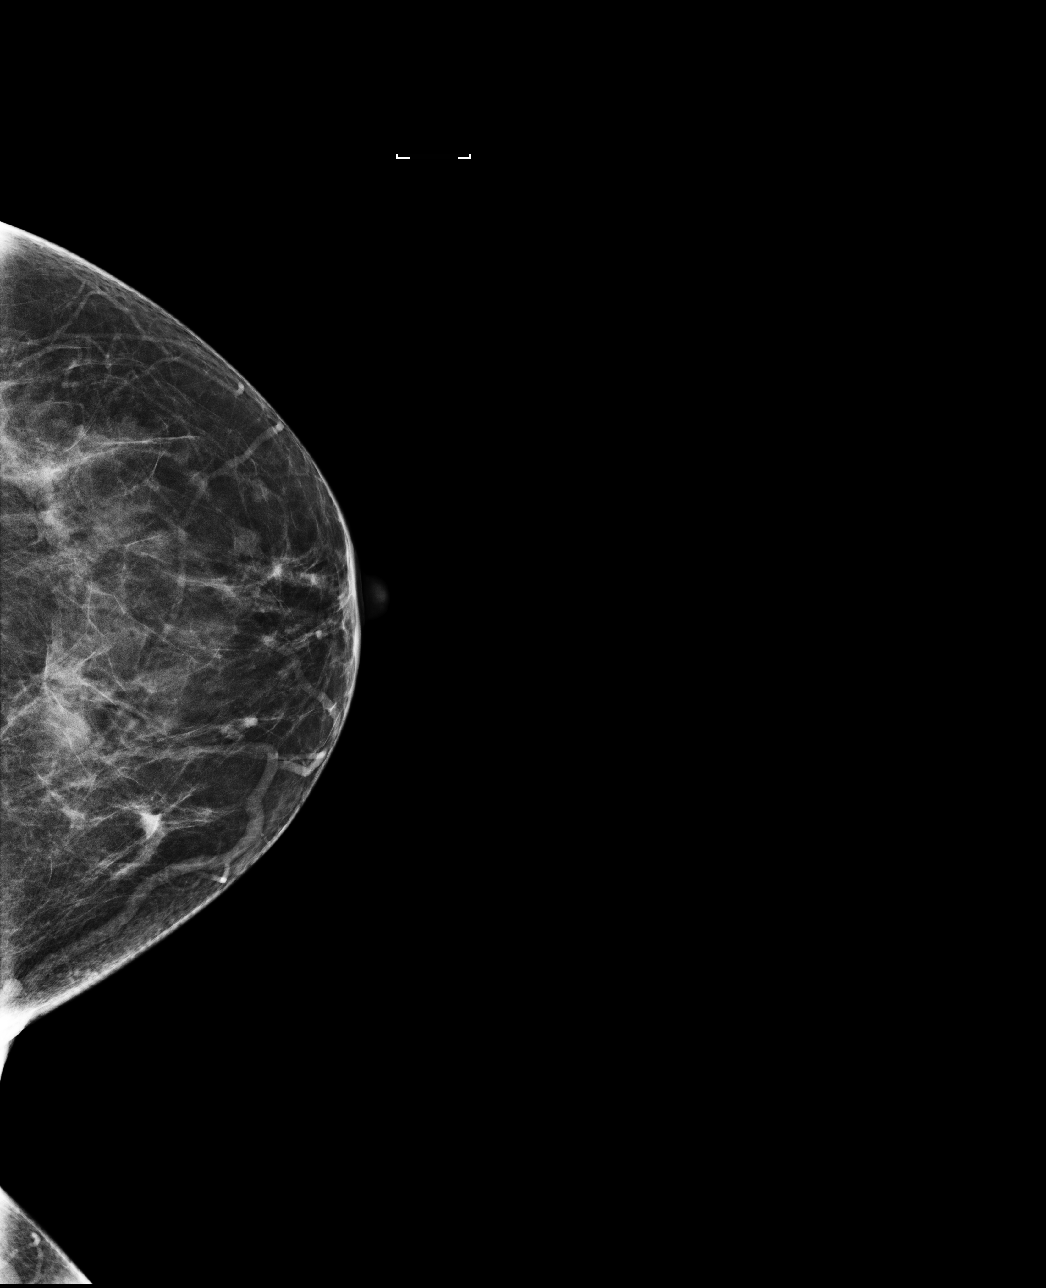

[L MLO]
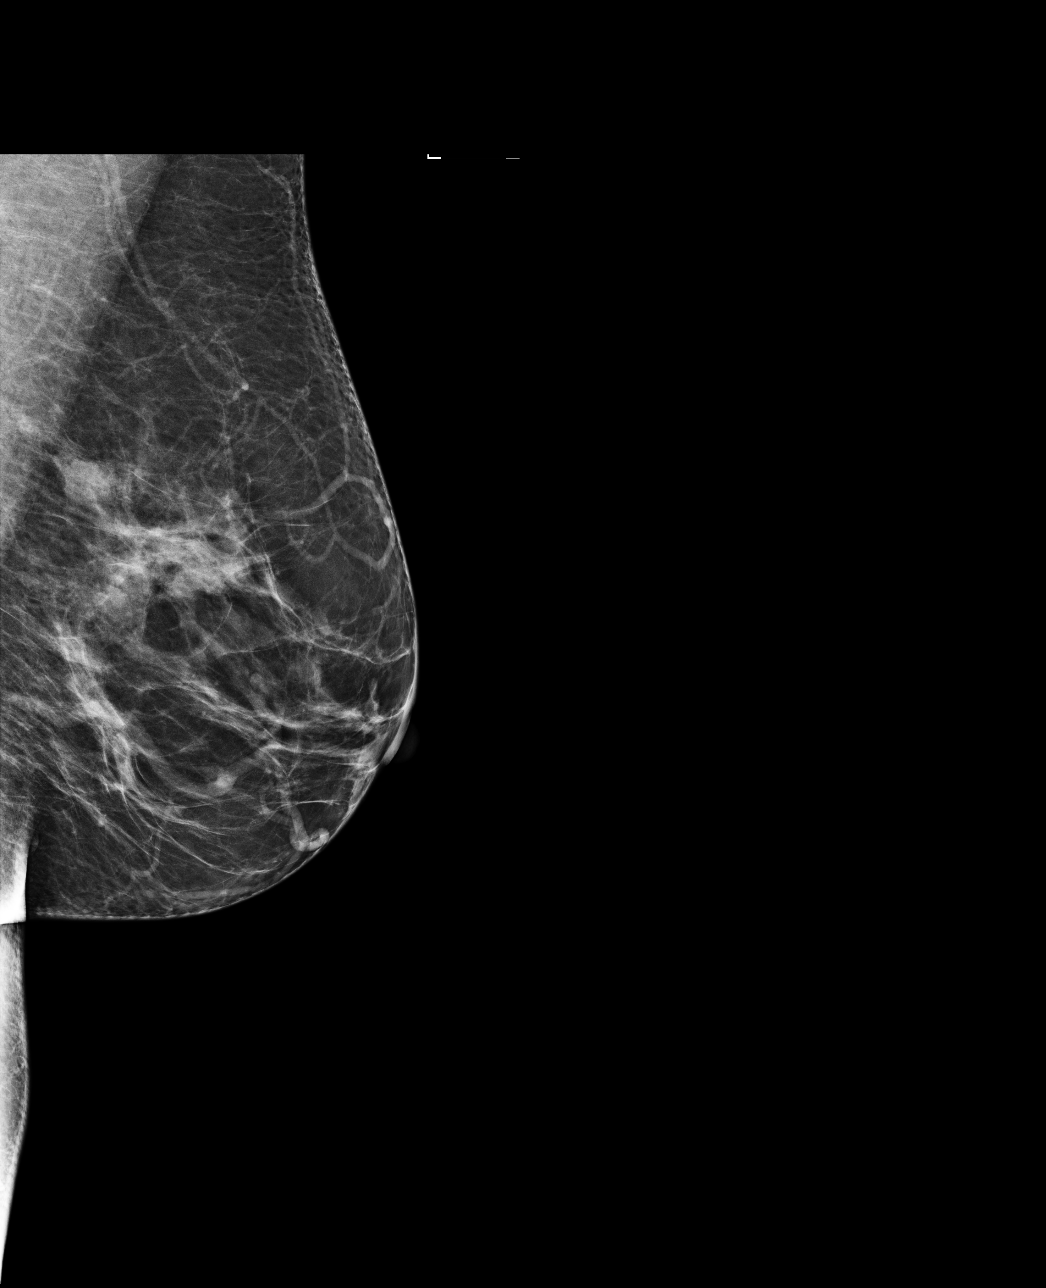

[R CC]
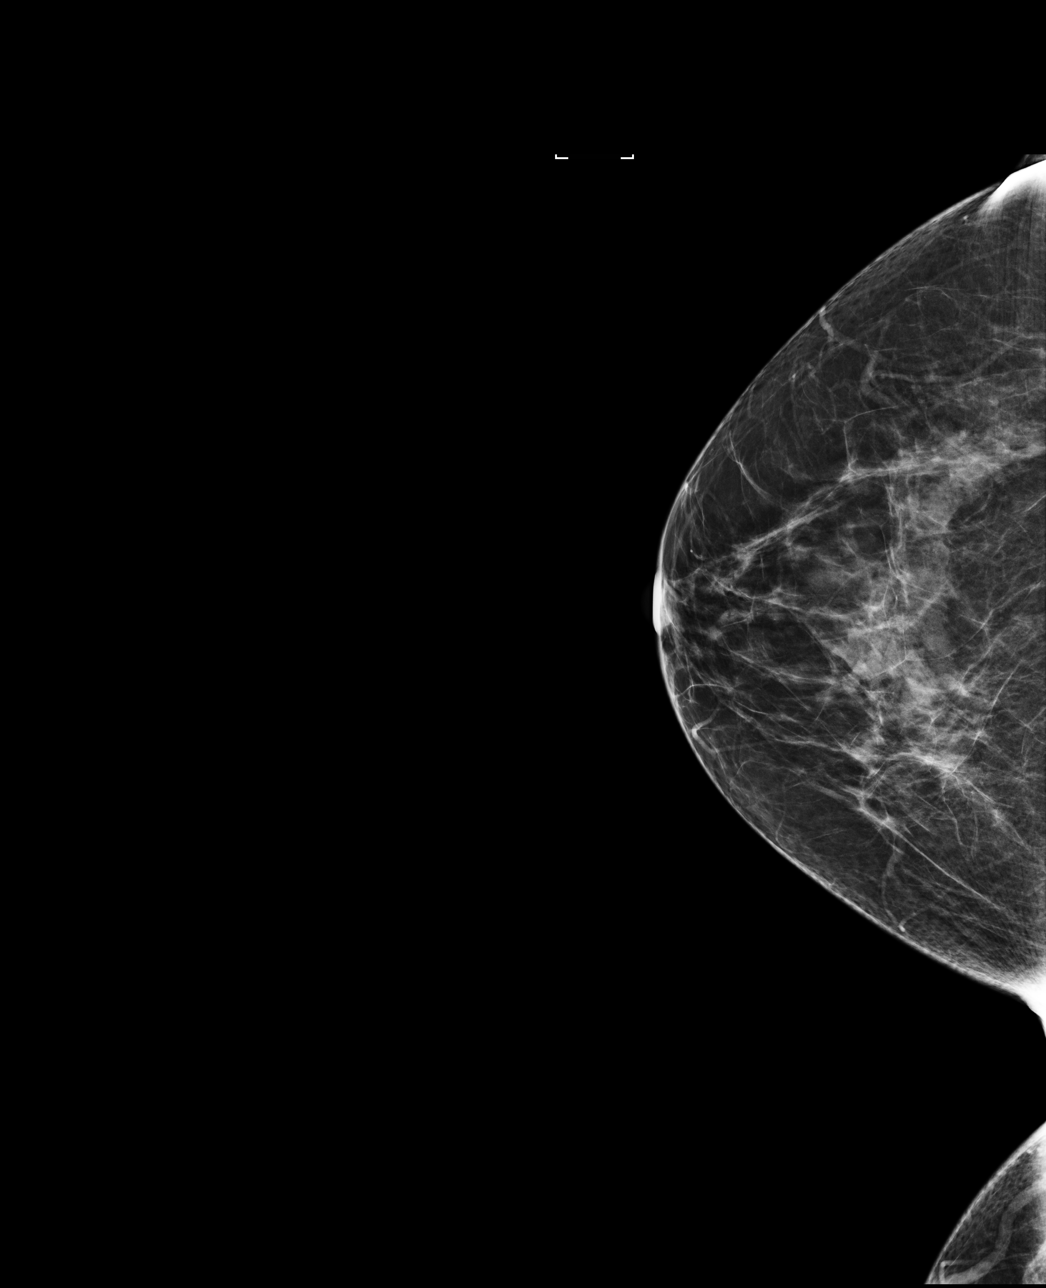

[R MLO]
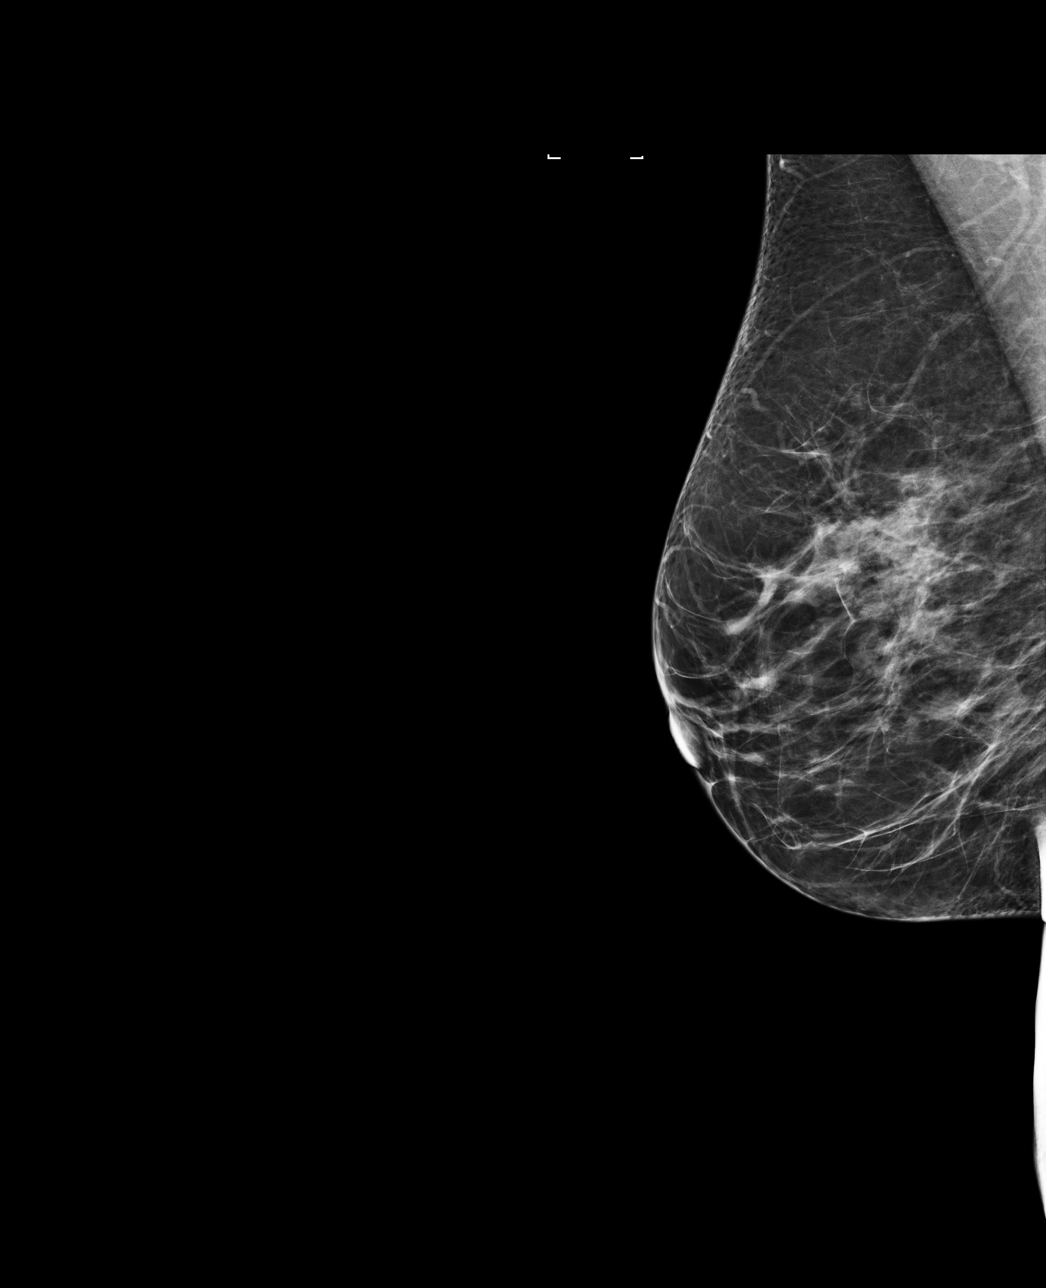

[4 of 4 positions shown; findings below may reference images not displayed]

ACR Breast Density Category c: The breast tissue is heterogeneously
dense, which may obscure small masses.
FINDINGS: In the left breast, a possible mass as well as 2 possible
asymmetries warrant further evaluation. In the right breast, no
findings suspicious for malignancy.

Images were processed with CAD.
IMPRESSION: Further evaluation is suggested for possible mass as well as 2
possible asymmetries in the left breast.

RECOMMENDATION:
Diagnostic mammogram and possibly ultrasound of the left breast.
(Code:[8F])

The patient will be contacted regarding the findings, and additional
imaging will be scheduled.

BI-RADS CATEGORY  0: Incomplete. Need additional imaging evaluation
and/or prior mammograms for comparison.

## 2019-11-05 ENCOUNTER — Other Ambulatory Visit: Payer: Self-pay | Admitting: Family Medicine

## 2019-11-05 DIAGNOSIS — R928 Other abnormal and inconclusive findings on diagnostic imaging of breast: Secondary | ICD-10-CM

## 2019-12-03 ENCOUNTER — Other Ambulatory Visit: Payer: Self-pay

## 2019-12-03 ENCOUNTER — Other Ambulatory Visit: Payer: Self-pay | Admitting: Family Medicine

## 2019-12-03 ENCOUNTER — Ambulatory Visit
Admission: RE | Admit: 2019-12-03 | Discharge: 2019-12-03 | Disposition: A | Discharge status: Home / Self Care | Payer: 59 | Source: Ambulatory Visit | Attending: Family Medicine | Admitting: Family Medicine

## 2019-12-03 DIAGNOSIS — R928 Other abnormal and inconclusive findings on diagnostic imaging of breast: Secondary | ICD-10-CM

## 2019-12-03 IMAGING — MG MM DIGITAL DIAGNOSTIC UNILAT*L* W/ TOMO W/ CAD
5 of 8 series · 5 of 24 positions shown · non-contrast
Comparison: Previous exam(s).
COMPARISON: Previous exam(s).

Addendum:
CLINICAL DATA: The patient was called back for left-sided masses
and asymmetries.

EXAM:
DIGITAL DIAGNOSTIC LEFT MAMMOGRAM
ULTRASOUND LEFT BREAST

[L CC synth-2D (1 of 2)]
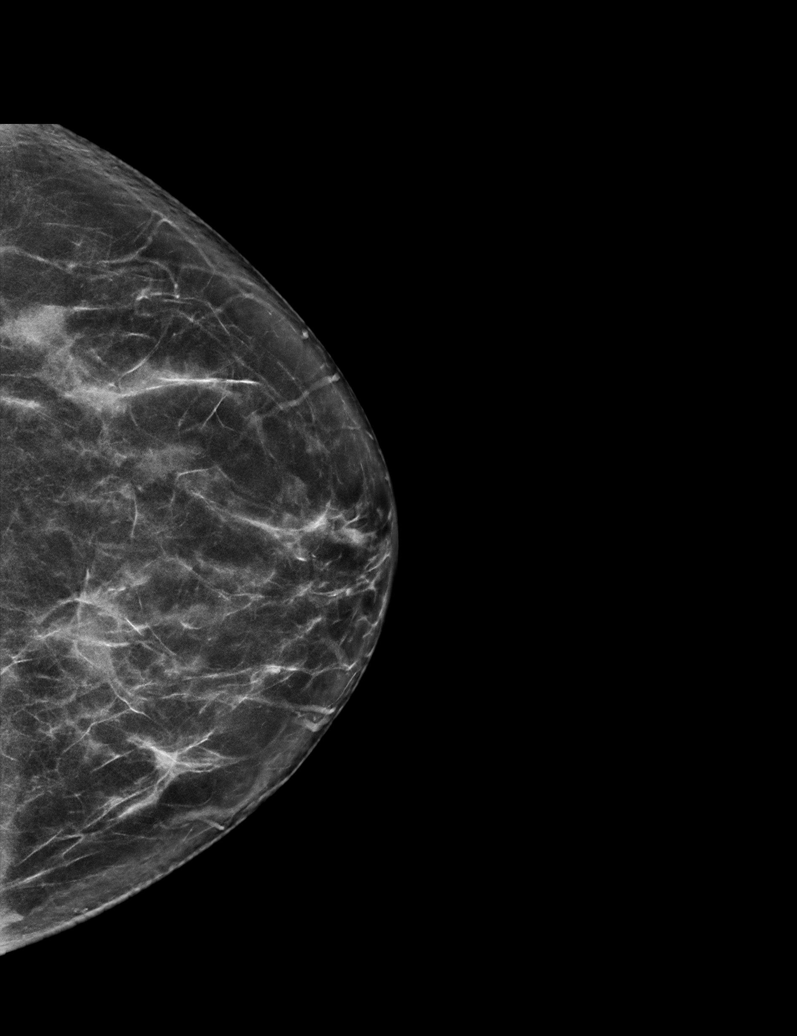

[L MLO synth-2D (1 of 2)]
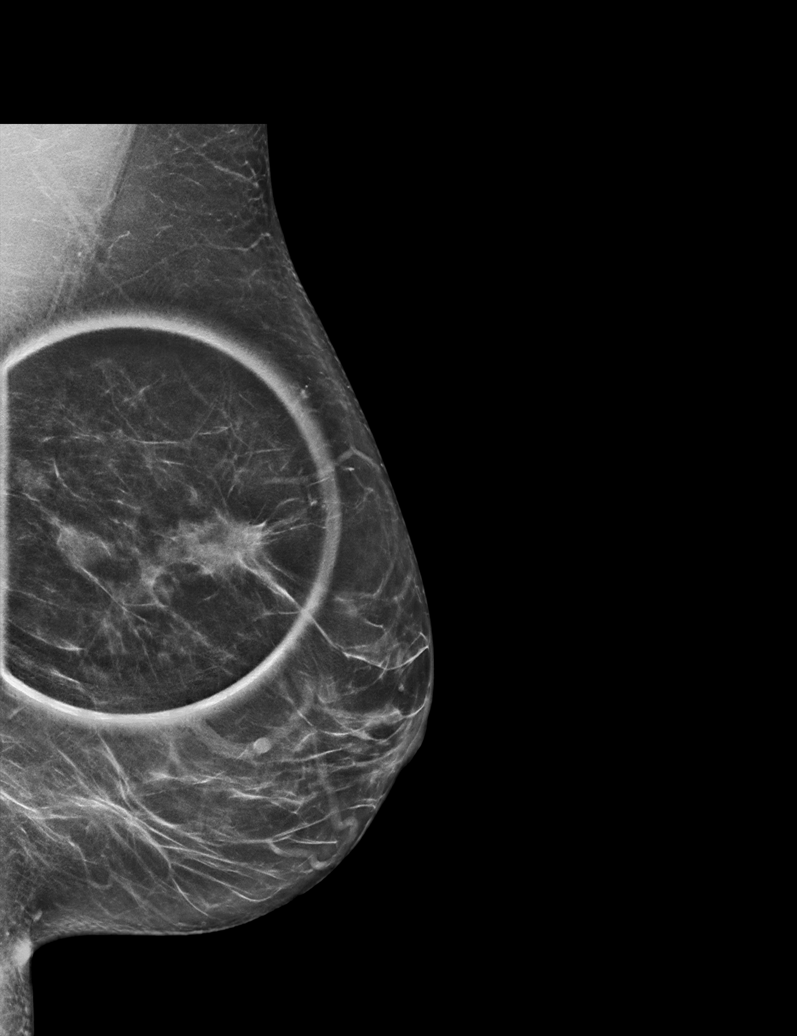

[L MLO synth-2D (2 of 2)]
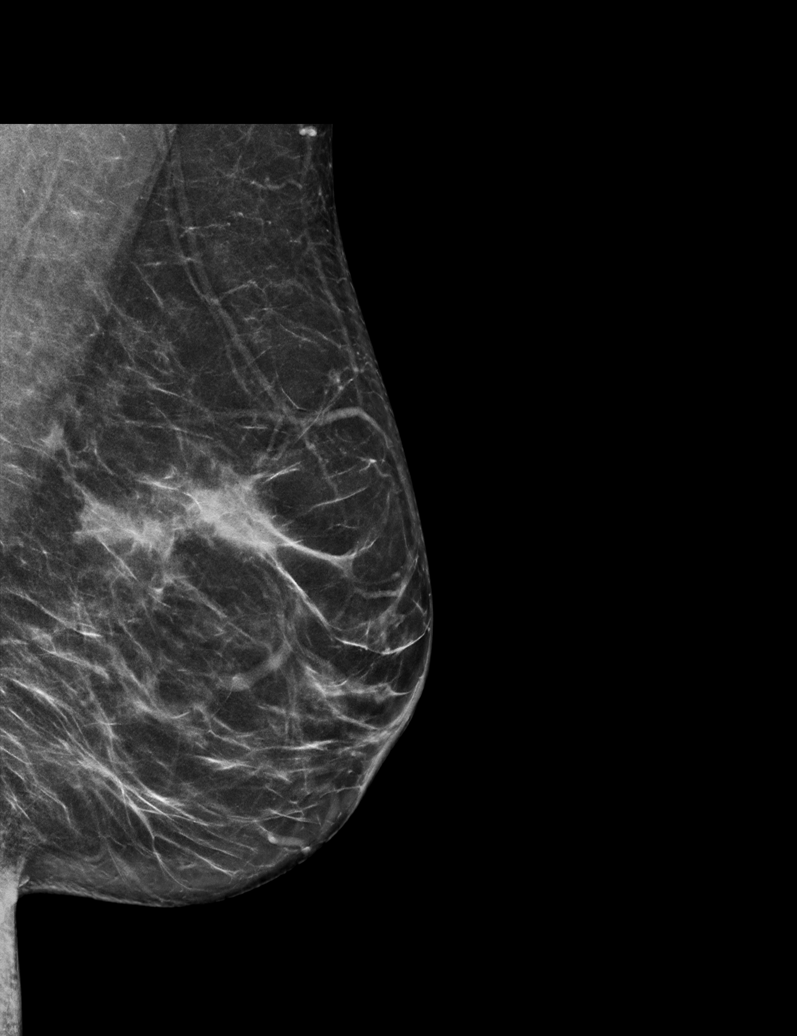

[L CC synth-2D (2 of 2)]
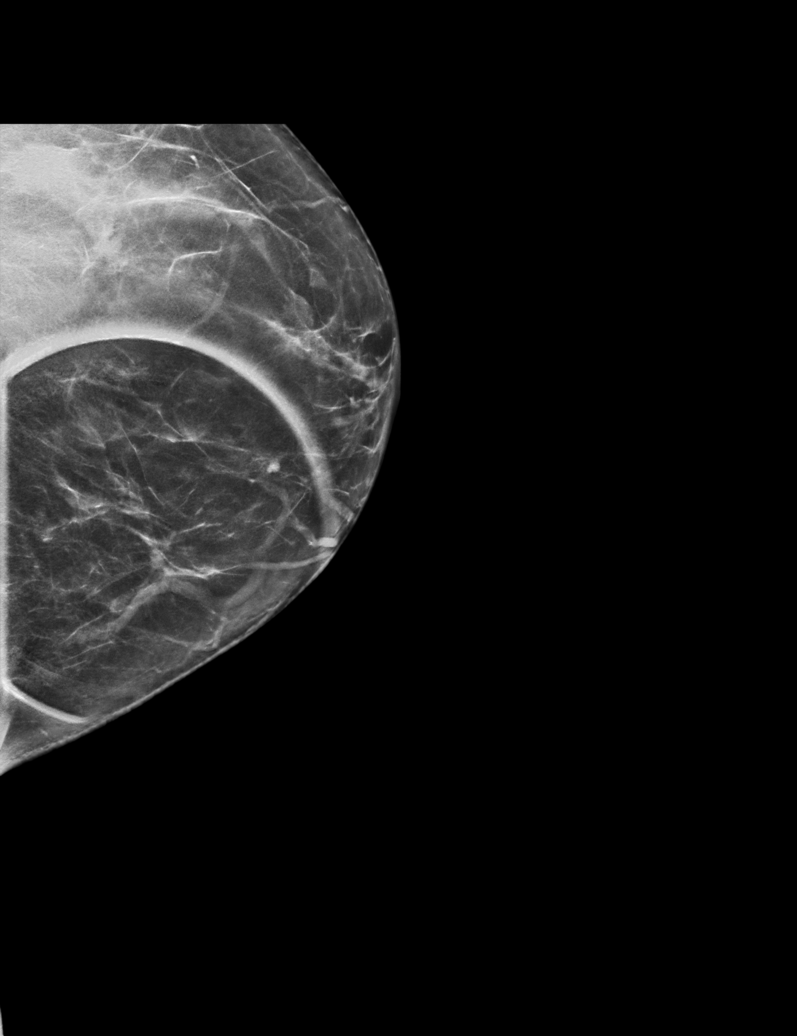

[L MLO tomo · tomo slice 34/67.0]
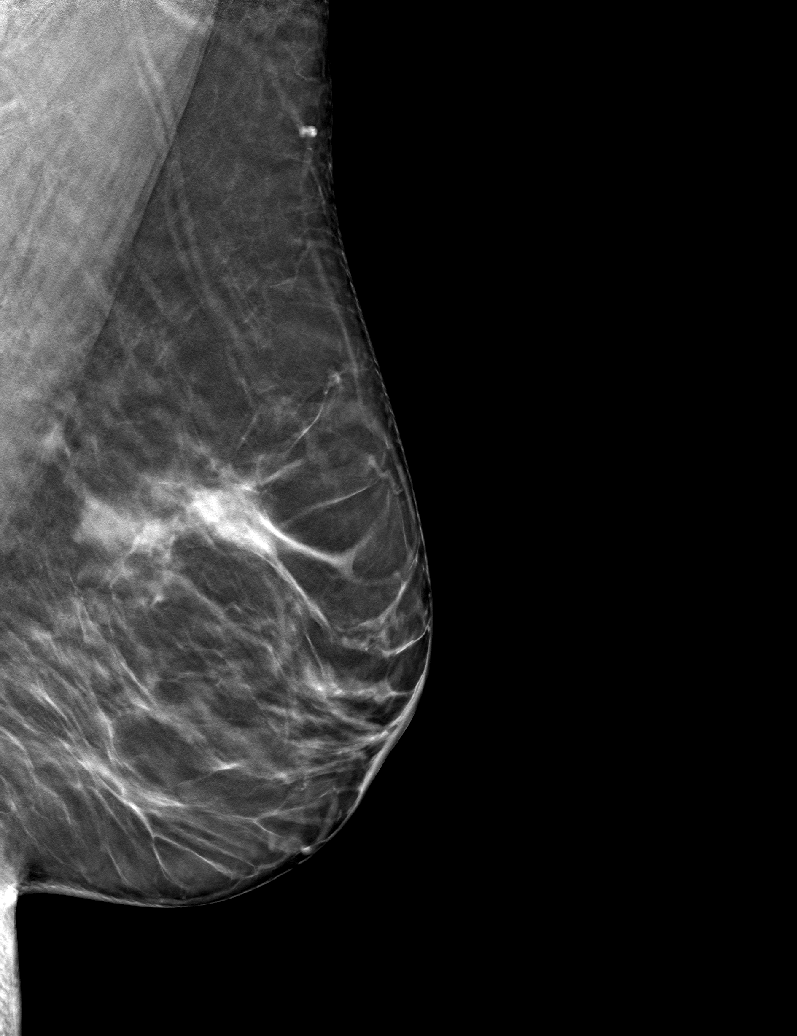

[5 of 24 positions shown; findings below may reference images not displayed]

ACR Breast Density Category c: The breast tissue is heterogeneously
dense, which may obscure small masses.
FINDINGS: There is a mass in the upper outer right breast at a posterior
depth. Just superior and posterior to this mass is another irregular
mass projected over the pectoralis muscle seen on the MLO view only.
There are 1 or 2 masses in the anterior aspect of the lateral left
breast best seen on the cc view. An asymmetry in the posteromedial
left breast improves but does not completely resolve on additional
imaging. A final asymmetry in the far medial left breast does
resolve on additional imaging

On physical exam, no suspicious lumps are identified.

Targeted ultrasound is performed, showing an irregular mass in the
left breast at [DATE], 6 cm from the nipple measuring 7 x 5 x 4 mm. An
adjacent mass is seen at [DATE], 6 cm from the nipple measuring 11 by
11 x 7 mm. These 2 masses are 1.4 cm apart. A hypoechoic mass with
internal cystic structures is seen at 3 o'clock, 3 cm from the
nipple measuring 4 x 5 x 5 mm, likely focal fibrocystic change or a
for 2 metaplasia. Another mass in the left breast at 11 o'clock, 3
cm from the nipple measures 8 x 9 x 4 mm, likely either a fibroid or
a for could metaplasia. No other abnormal masses are seen in the
left breast. No left axillary adenopathy.
IMPRESSION: 1. Multiple left breast masses. The masses at [DATE], 6 cm from the
nipple are the most suspicious. The other masses are probably
benign.

RECOMMENDATION:
Recommend ultrasound-guided biopsy of the left breast masses at
[DATE], 6 cm from the nipple measuring 7 and 11 mm. If these biopsies
are benign, recommend six-month follow-up of the additional left
breast masses with six-month follow-up mammogram and ultrasound. If
these masses or malignant, recommend breast MRI due to the
complexity of the patient's breast pattern. If the biopsies are
malignant and the patient is considering breast conservation,
additional biopsies will be necessary but will be determined after
the breast MRI.

I have discussed the findings and recommendations with the patient.
If applicable, a reminder letter will be sent to the patient
regarding the next appointment.

BI-RADS CATEGORY  4: Suspicious.

ADDENDUM:
All breast masses are on the left. A mass was described on the right
in the original findings section. This is incorrect.

*** End of Addendum ***
ACR Breast Density Category c: The breast tissue is heterogeneously
dense, which may obscure small masses.
FINDINGS: There is a mass in the upper outer right breast at a posterior
depth. Just superior and posterior to this mass is another irregular
mass projected over the pectoralis muscle seen on the MLO view only.
There are 1 or 2 masses in the anterior aspect of the lateral left
breast best seen on the cc view. An asymmetry in the posteromedial
left breast improves but does not completely resolve on additional
imaging. A final asymmetry in the far medial left breast does
resolve on additional imaging

On physical exam, no suspicious lumps are identified.

Targeted ultrasound is performed, showing an irregular mass in the
left breast at [DATE], 6 cm from the nipple measuring 7 x 5 x 4 mm. An
adjacent mass is seen at [DATE], 6 cm from the nipple measuring 11 by
11 x 7 mm. These 2 masses are 1.4 cm apart. A hypoechoic mass with
internal cystic structures is seen at 3 o'clock, 3 cm from the
nipple measuring 4 x 5 x 5 mm, likely focal fibrocystic change or a
for 2 metaplasia. Another mass in the left breast at 11 o'clock, 3
cm from the nipple measures 8 x 9 x 4 mm, likely either a fibroid or
a for could metaplasia. No other abnormal masses are seen in the
left breast. No left axillary adenopathy.
IMPRESSION: 1. Multiple left breast masses. The masses at [DATE], 6 cm from the
nipple are the most suspicious. The other masses are probably
benign.

RECOMMENDATION:
Recommend ultrasound-guided biopsy of the left breast masses at
[DATE], 6 cm from the nipple measuring 7 and 11 mm. If these biopsies
are benign, recommend six-month follow-up of the additional left
breast masses with six-month follow-up mammogram and ultrasound. If
these masses or malignant, recommend breast MRI due to the
complexity of the patient's breast pattern. If the biopsies are
malignant and the patient is considering breast conservation,
additional biopsies will be necessary but will be determined after
the breast MRI.

I have discussed the findings and recommendations with the patient.
If applicable, a reminder letter will be sent to the patient
regarding the next appointment.

BI-RADS CATEGORY  4: Suspicious.

## 2019-12-03 IMAGING — US US BREAST*L* LIMITED INC AXILLA
1 series · 16 of 24 positions shown · non-contrast
Comparison: Previous exam(s).
COMPARISON: Previous exam(s).

Addendum:
CLINICAL DATA: The patient was called back for left-sided masses
and asymmetries.

EXAM:
DIGITAL DIAGNOSTIC LEFT MAMMOGRAM
ULTRASOUND LEFT BREAST

[Series 1: us breast*left* limited inc axilla · 0.07mm/px · 16 of 24 slices shown]
[im 1/24]
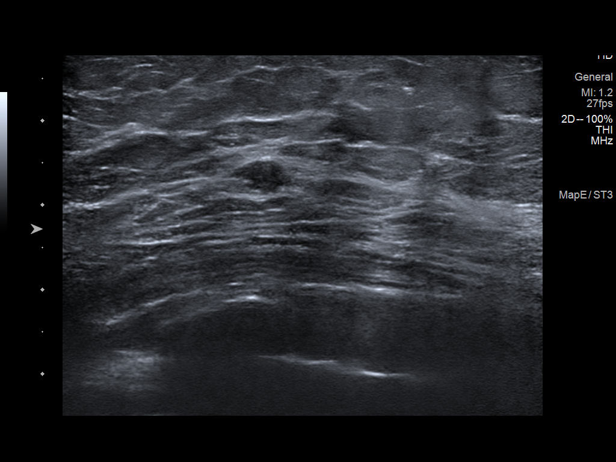
[im 3/24]
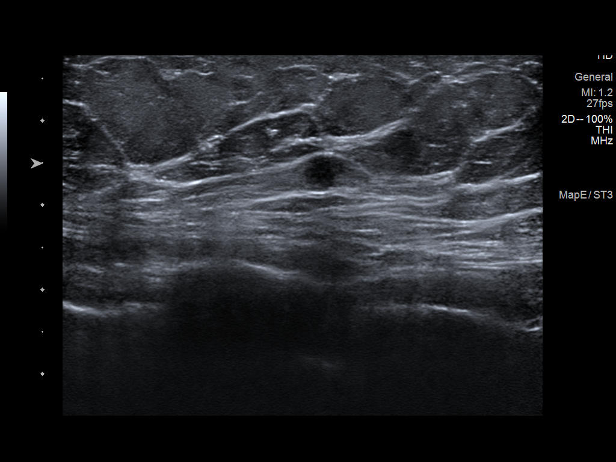
[im 4/24]
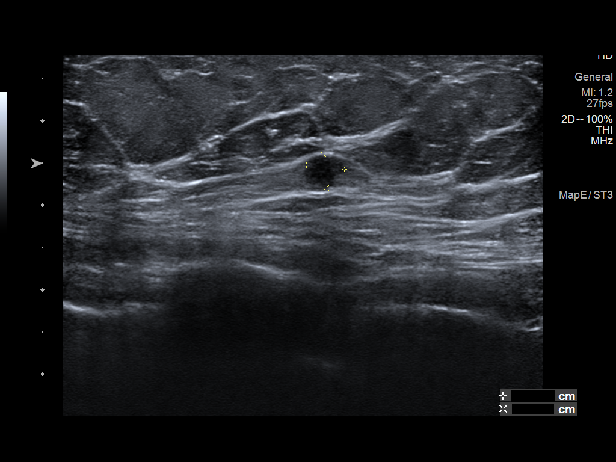
[im 6/24]
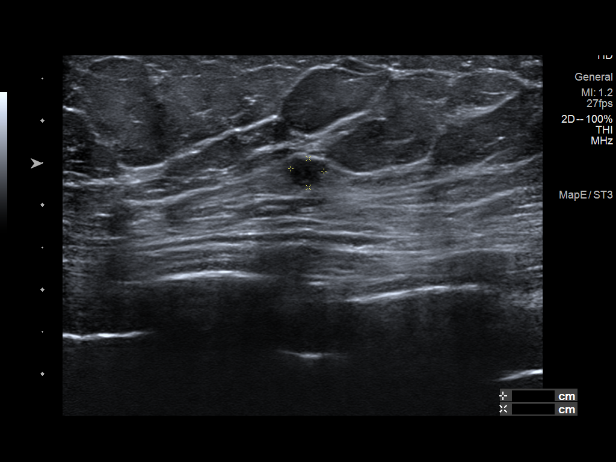
[im 7/24]
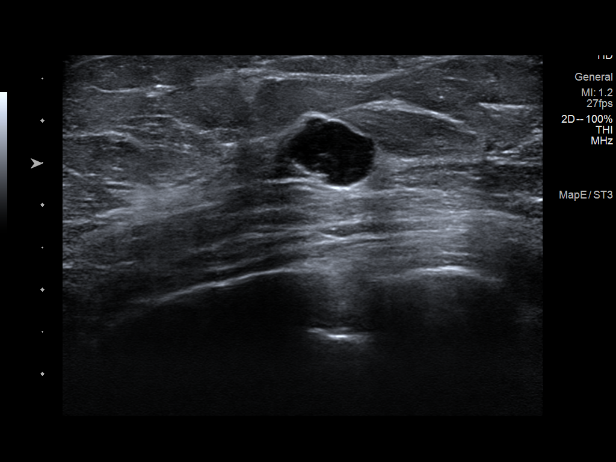
[im 9/24]
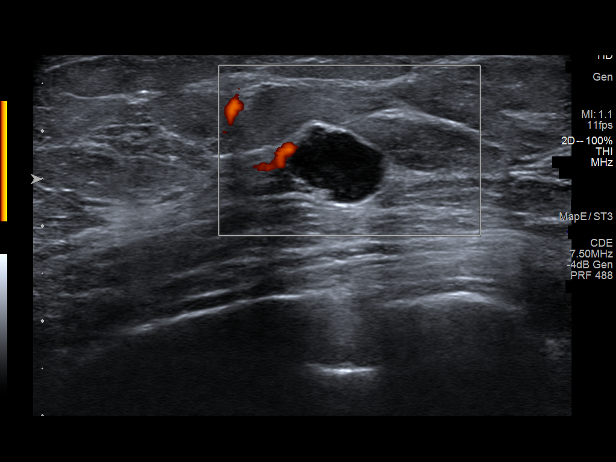
[im 10/24]
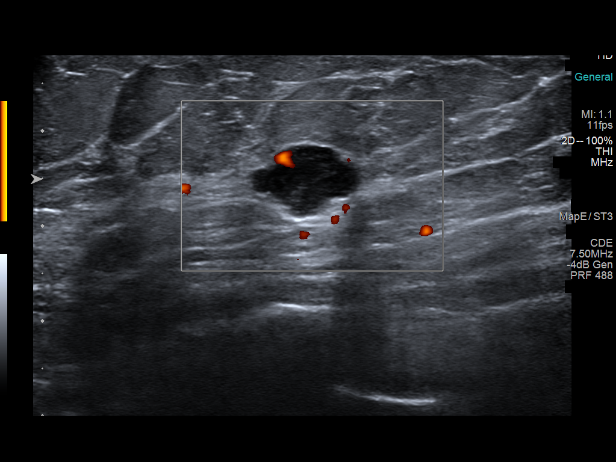
[im 12/24]
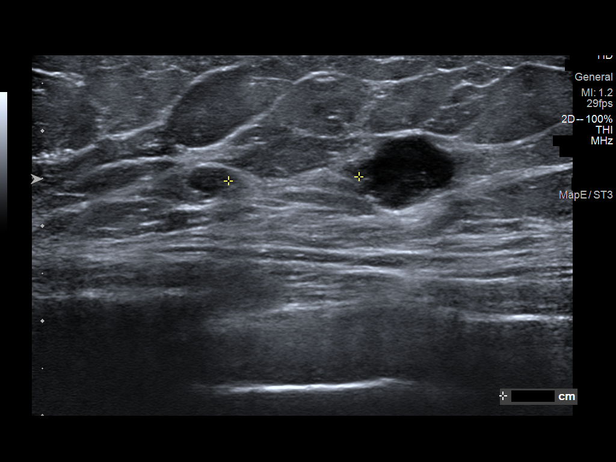
[im 13/24]
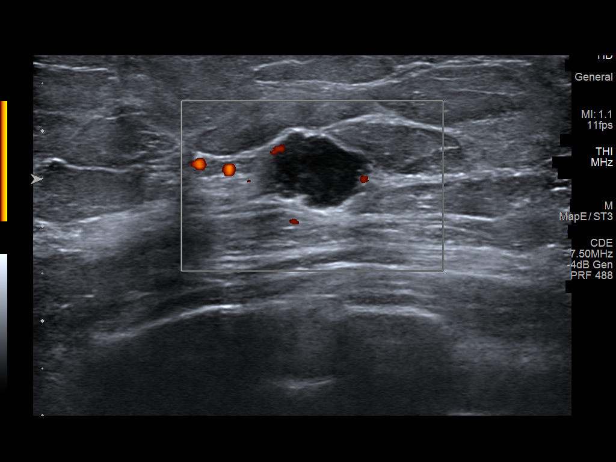
[im 15/24]
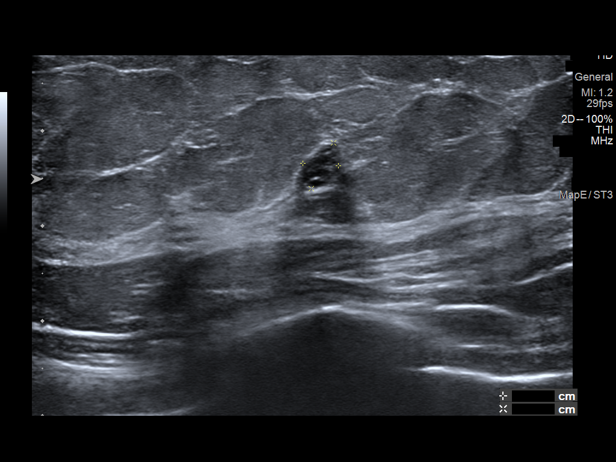
[im 16/24]
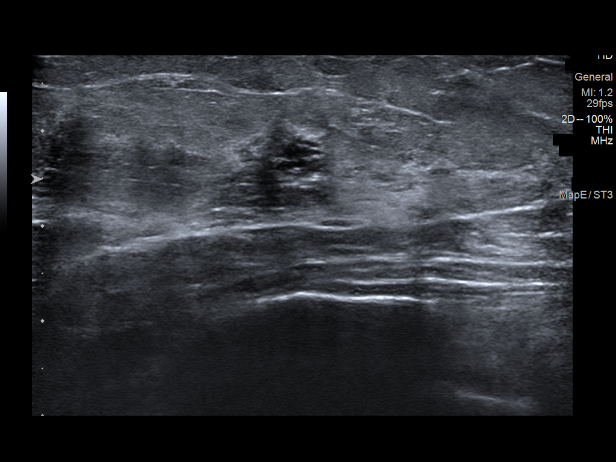
[im 18/24]
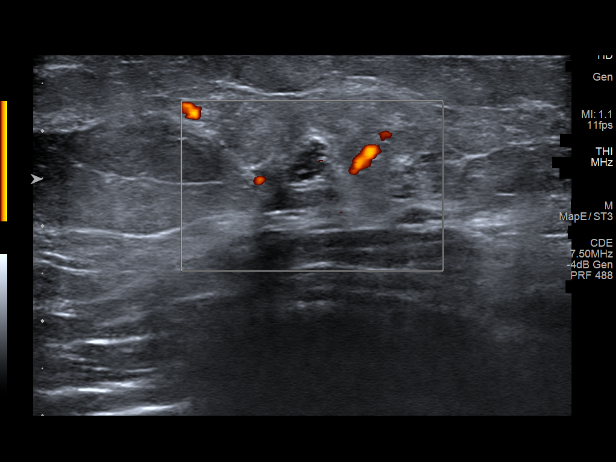
[im 19/24]
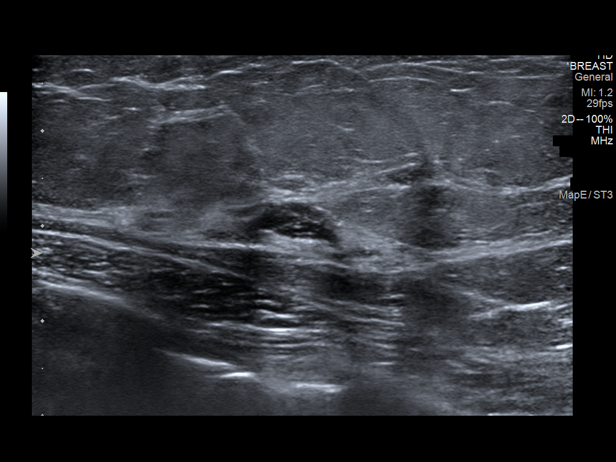
[im 21/24]
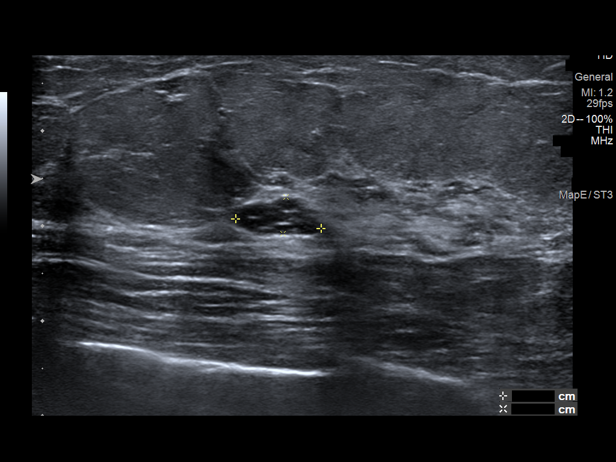
[im 22/24]
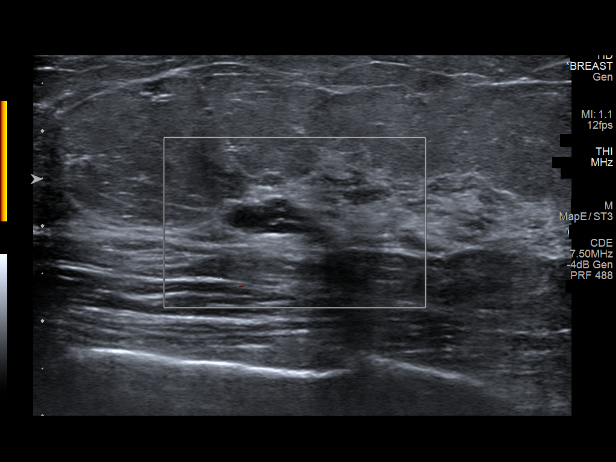
[im 24/24]
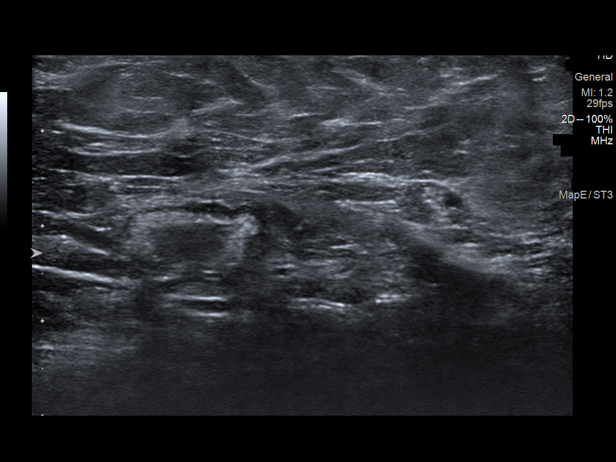

[16 of 24 positions shown; findings below may reference images not displayed]

ACR Breast Density Category c: The breast tissue is heterogeneously
dense, which may obscure small masses.
FINDINGS: There is a mass in the upper outer right breast at a posterior
depth. Just superior and posterior to this mass is another irregular
mass projected over the pectoralis muscle seen on the MLO view only.
There are 1 or 2 masses in the anterior aspect of the lateral left
breast best seen on the cc view. An asymmetry in the posteromedial
left breast improves but does not completely resolve on additional
imaging. A final asymmetry in the far medial left breast does
resolve on additional imaging

On physical exam, no suspicious lumps are identified.

Targeted ultrasound is performed, showing an irregular mass in the
left breast at [DATE], 6 cm from the nipple measuring 7 x 5 x 4 mm. An
adjacent mass is seen at [DATE], 6 cm from the nipple measuring 11 by
11 x 7 mm. These 2 masses are 1.4 cm apart. A hypoechoic mass with
internal cystic structures is seen at 3 o'clock, 3 cm from the
nipple measuring 4 x 5 x 5 mm, likely focal fibrocystic change or a
for 2 metaplasia. Another mass in the left breast at 11 o'clock, 3
cm from the nipple measures 8 x 9 x 4 mm, likely either a fibroid or
a for could metaplasia. No other abnormal masses are seen in the
left breast. No left axillary adenopathy.
IMPRESSION: 1. Multiple left breast masses. The masses at [DATE], 6 cm from the
nipple are the most suspicious. The other masses are probably
benign.

RECOMMENDATION:
Recommend ultrasound-guided biopsy of the left breast masses at
[DATE], 6 cm from the nipple measuring 7 and 11 mm. If these biopsies
are benign, recommend six-month follow-up of the additional left
breast masses with six-month follow-up mammogram and ultrasound. If
these masses or malignant, recommend breast MRI due to the
complexity of the patient's breast pattern. If the biopsies are
malignant and the patient is considering breast conservation,
additional biopsies will be necessary but will be determined after
the breast MRI.

I have discussed the findings and recommendations with the patient.
If applicable, a reminder letter will be sent to the patient
regarding the next appointment.

BI-RADS CATEGORY  4: Suspicious.

ADDENDUM:
All breast masses are on the left. A mass was described on the right
in the original findings section. This is incorrect.

*** End of Addendum ***
ACR Breast Density Category c: The breast tissue is heterogeneously
dense, which may obscure small masses.
FINDINGS: There is a mass in the upper outer right breast at a posterior
depth. Just superior and posterior to this mass is another irregular
mass projected over the pectoralis muscle seen on the MLO view only.
There are 1 or 2 masses in the anterior aspect of the lateral left
breast best seen on the cc view. An asymmetry in the posteromedial
left breast improves but does not completely resolve on additional
imaging. A final asymmetry in the far medial left breast does
resolve on additional imaging

On physical exam, no suspicious lumps are identified.

Targeted ultrasound is performed, showing an irregular mass in the
left breast at [DATE], 6 cm from the nipple measuring 7 x 5 x 4 mm. An
adjacent mass is seen at [DATE], 6 cm from the nipple measuring 11 by
11 x 7 mm. These 2 masses are 1.4 cm apart. A hypoechoic mass with
internal cystic structures is seen at 3 o'clock, 3 cm from the
nipple measuring 4 x 5 x 5 mm, likely focal fibrocystic change or a
for 2 metaplasia. Another mass in the left breast at 11 o'clock, 3
cm from the nipple measures 8 x 9 x 4 mm, likely either a fibroid or
a for could metaplasia. No other abnormal masses are seen in the
left breast. No left axillary adenopathy.
IMPRESSION: 1. Multiple left breast masses. The masses at [DATE], 6 cm from the
nipple are the most suspicious. The other masses are probably
benign.

RECOMMENDATION:
Recommend ultrasound-guided biopsy of the left breast masses at
[DATE], 6 cm from the nipple measuring 7 and 11 mm. If these biopsies
are benign, recommend six-month follow-up of the additional left
breast masses with six-month follow-up mammogram and ultrasound. If
these masses or malignant, recommend breast MRI due to the
complexity of the patient's breast pattern. If the biopsies are
malignant and the patient is considering breast conservation,
additional biopsies will be necessary but will be determined after
the breast MRI.

I have discussed the findings and recommendations with the patient.
If applicable, a reminder letter will be sent to the patient
regarding the next appointment.

BI-RADS CATEGORY  4: Suspicious.

## 2019-12-15 ENCOUNTER — Ambulatory Visit
Admission: RE | Admit: 2019-12-15 | Discharge: 2019-12-15 | Disposition: A | Discharge status: Home / Self Care | Payer: 59 | Source: Ambulatory Visit | Attending: Family Medicine | Admitting: Family Medicine

## 2019-12-15 ENCOUNTER — Other Ambulatory Visit: Payer: Self-pay

## 2019-12-15 DIAGNOSIS — R928 Other abnormal and inconclusive findings on diagnostic imaging of breast: Secondary | ICD-10-CM

## 2019-12-15 HISTORY — PX: BREAST BIOPSY: SHX20

## 2019-12-15 IMAGING — MG MM BREAST LOCALIZATION CLIP
4 series · 4 of 12 positions shown · non-contrast
Comparison: Previous exam(s).

CLINICAL DATA: 58-year-old female status post 2 area
ultrasound-guided biopsy of the left breast.

EXAM:
DIAGNOSTIC LEFT MAMMOGRAM POST ULTRASOUND BIOPSY

[L ML synth-2D]
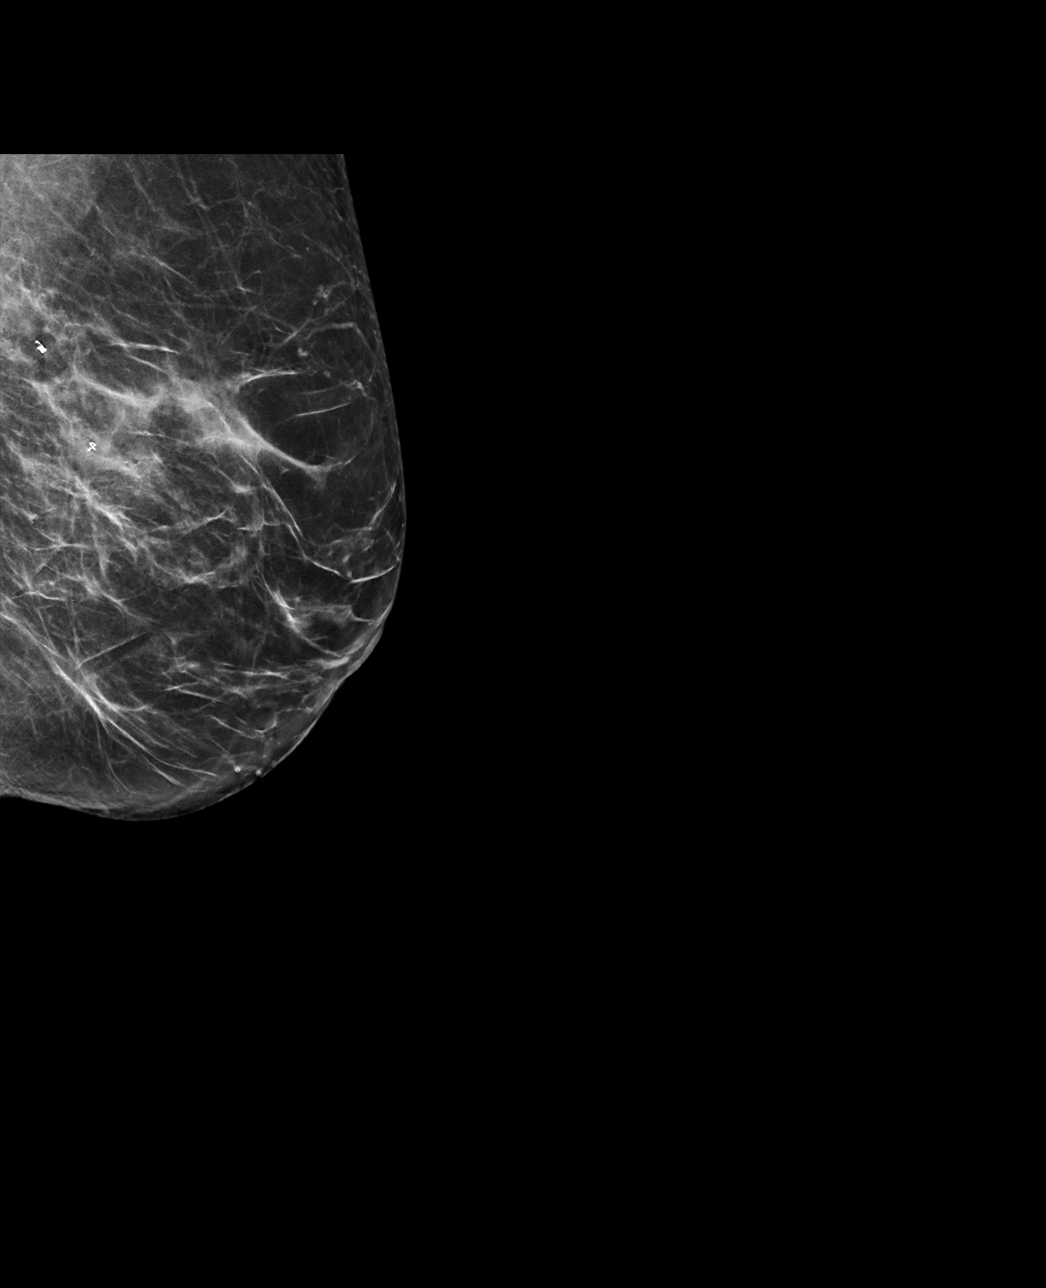

[L CC synth-2D]
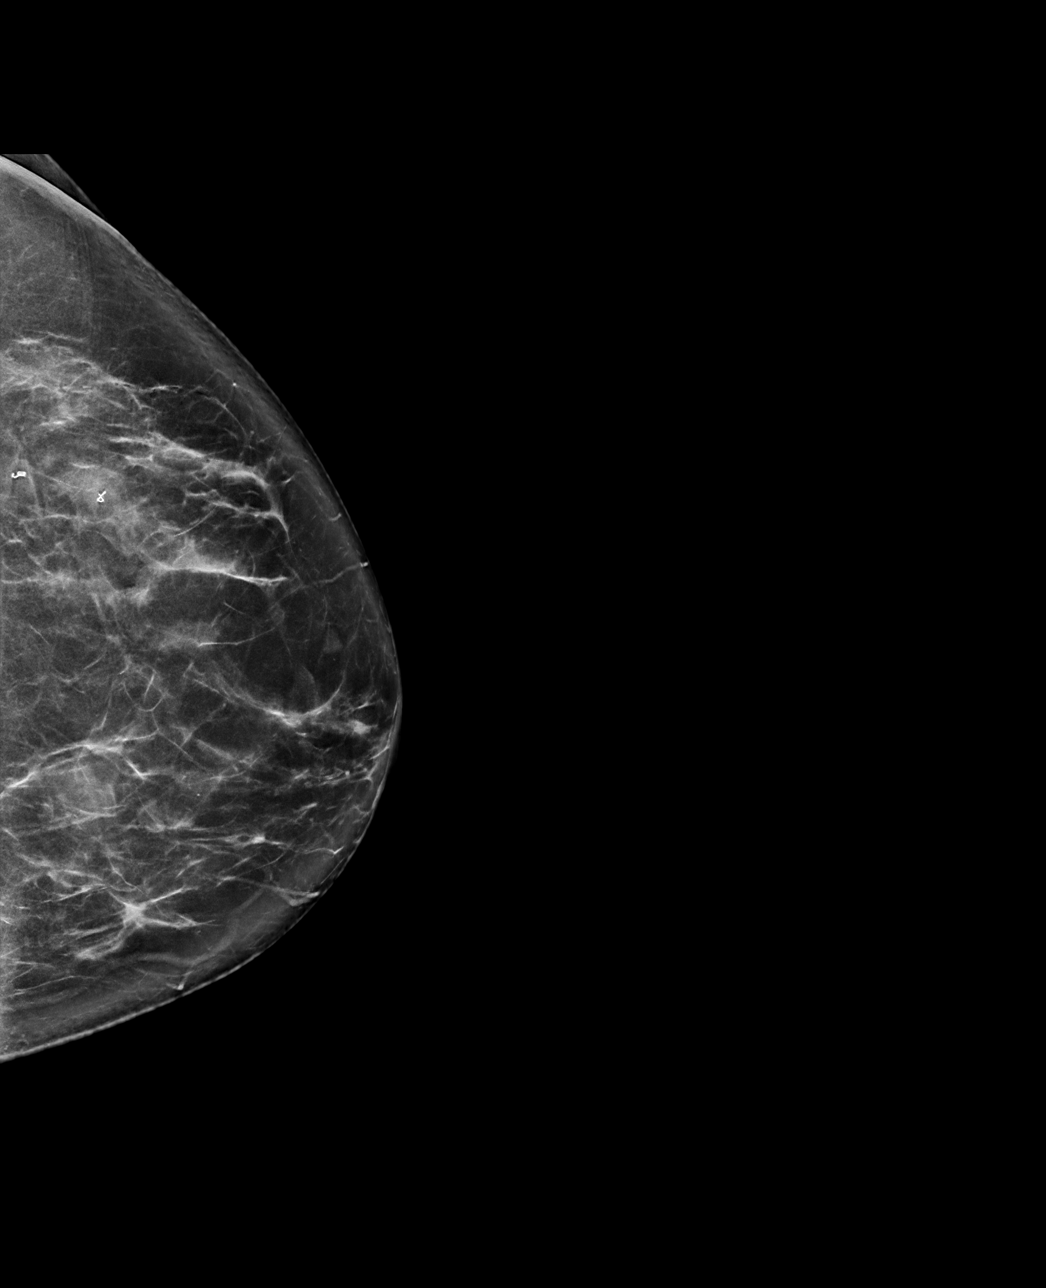

[L ML tomo · tomo slice 35/70.0]
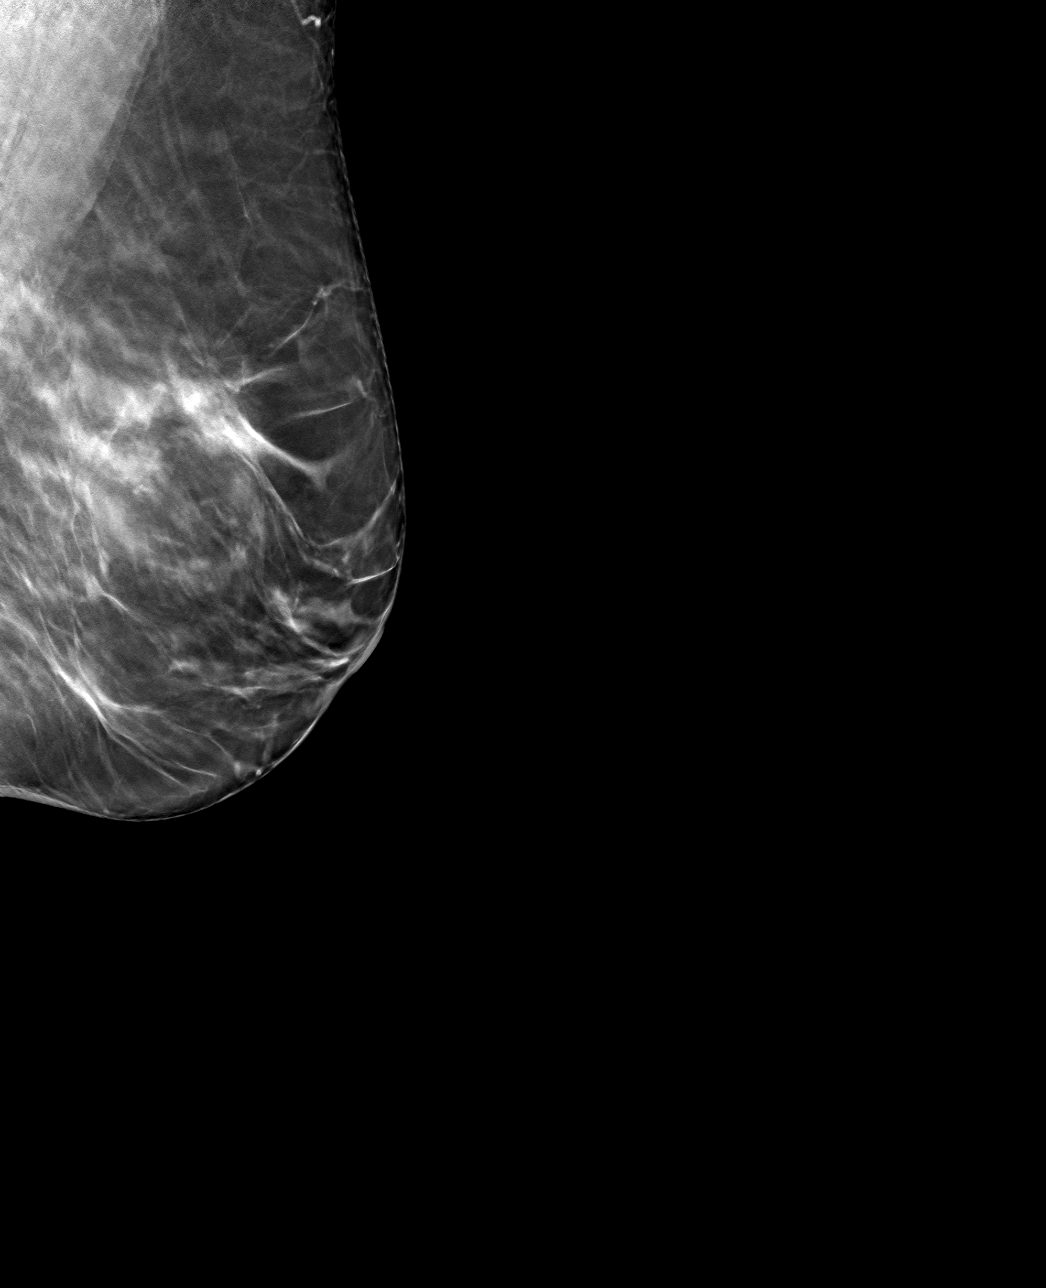

[L CC tomo · tomo slice 39/76.0]
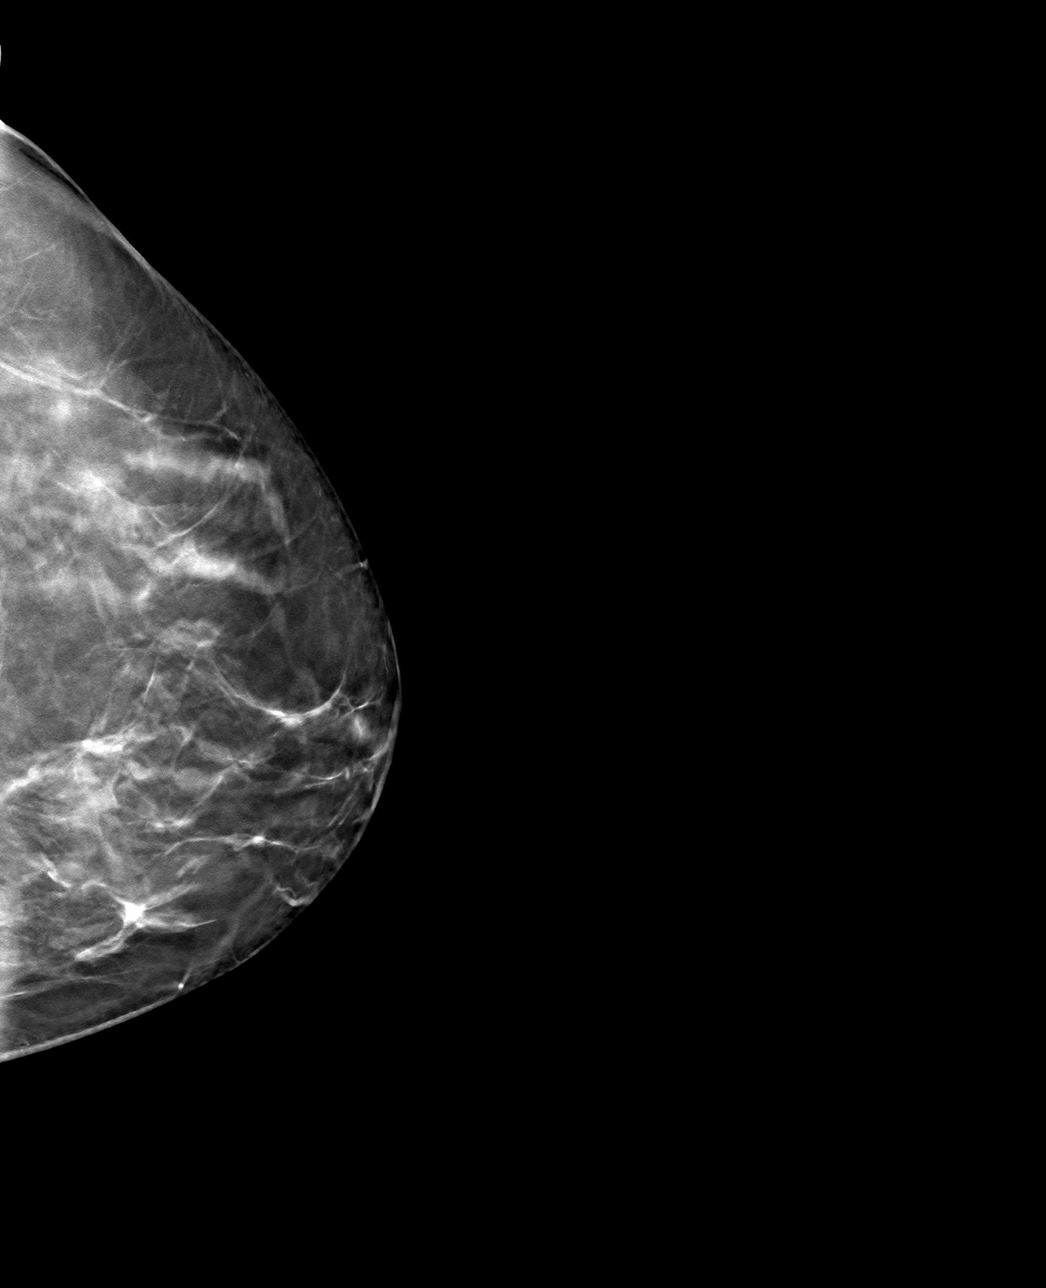

[4 of 12 positions shown; findings below may reference images not displayed]

FINDINGS: Mammographic images were obtained following ultrasound guided biopsy
of the left breast. The biopsy marking clips are in the expected
positions after biopsy.
IMPRESSION: Appropriate positioning of the ribbon and coil shaped biopsy marking
clips at the sites of biopsy in the upper-outer left breast.

Final Assessment: Post Procedure Mammograms for Marker Placement

## 2019-12-15 IMAGING — US US BREAST BX W LOC DEV 1ST LESION IMG BX SPEC US GUIDE*L*
1 series · 12 of 17 positions shown · non-contrast
Comparison: Previous exam(s).
COMPARISON: Previous exam(s).

Addendum:
CLINICAL DATA: 58-year-old female with 2 indeterminate left breast
masses.

EXAM:
ULTRASOUND GUIDED LEFT BREAST CORE NEEDLE BIOPSY x2

[Series 1: us breast bx w loc dev 1st lesion img bx spec us g · 0.07mm/px · 12 of 17 slices shown]
[im 1/17]
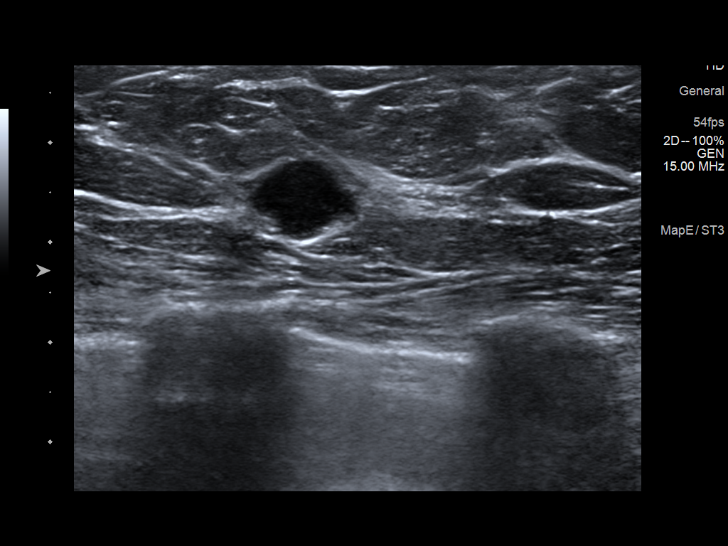
[im 3/17]
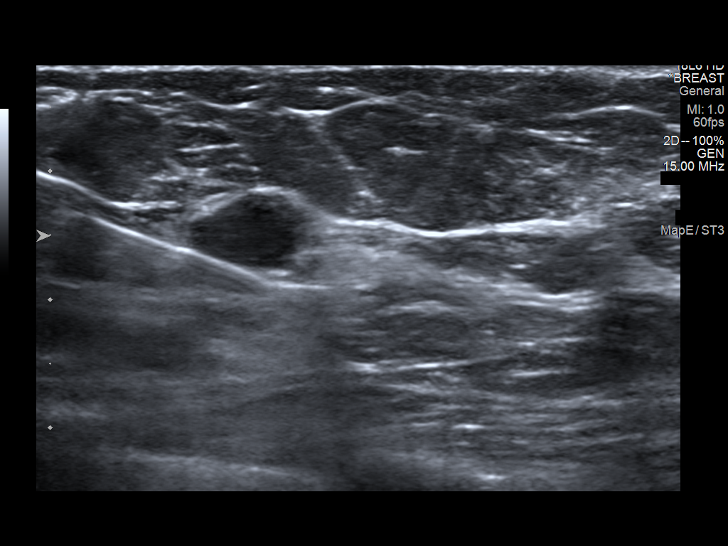
[im 4/17]
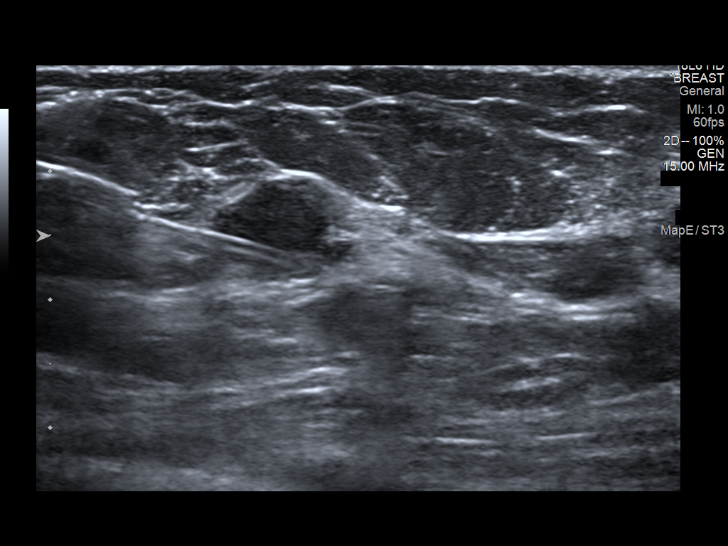
[im 5/17]
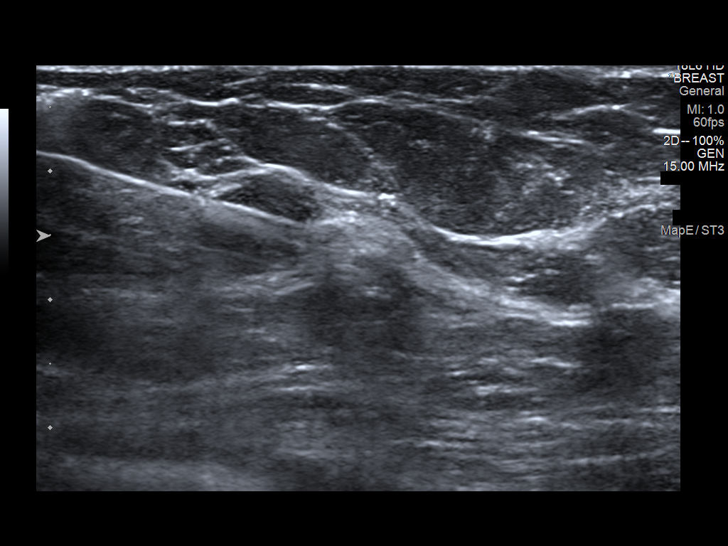
[im 7/17]
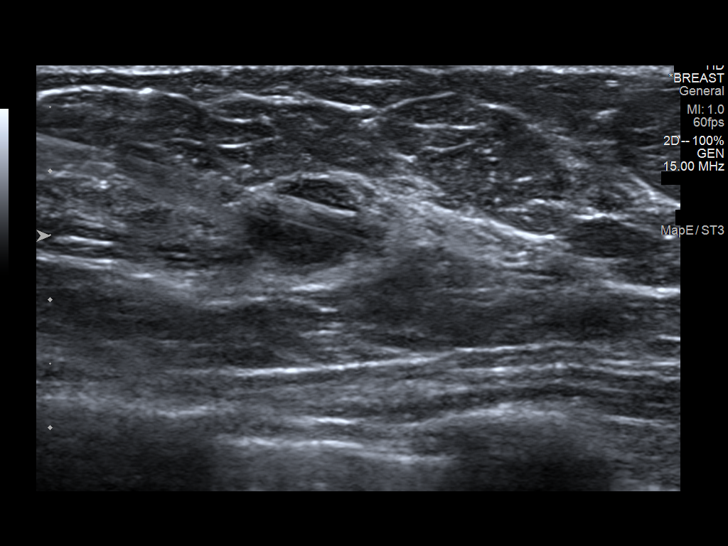
[im 8/17]
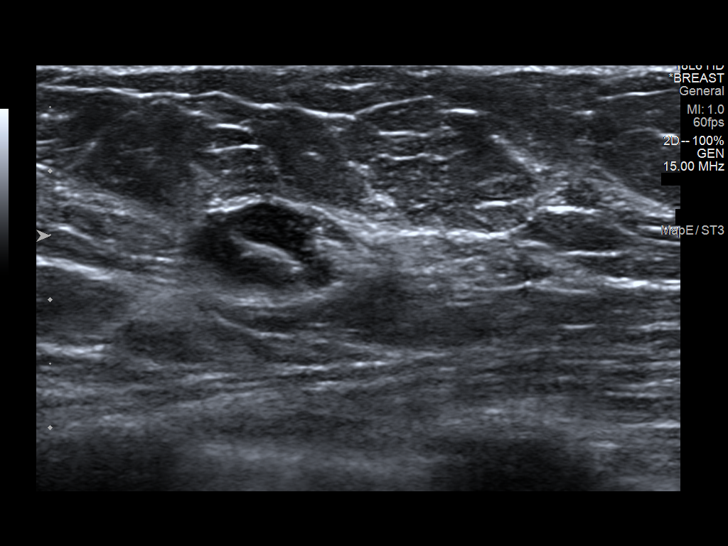
[im 10/17]
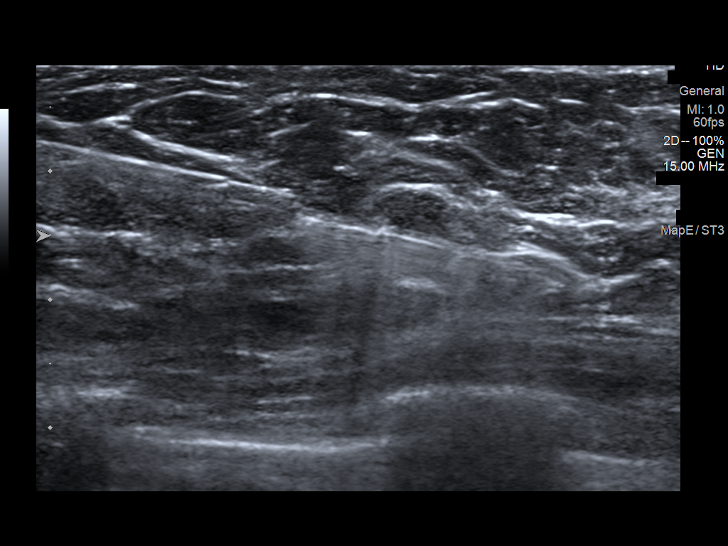
[im 11/17]
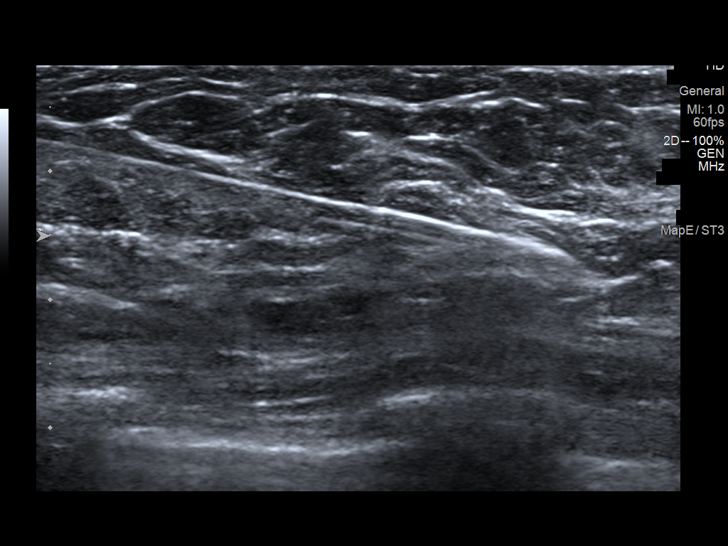
[im 13/17]
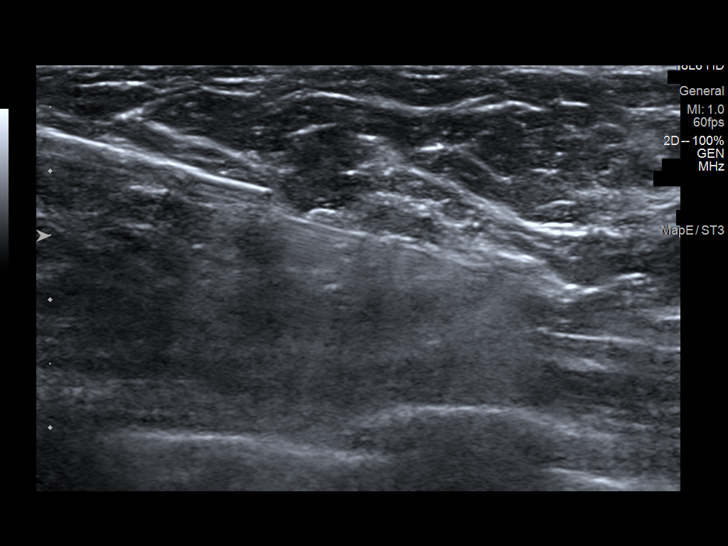
[im 14/17]
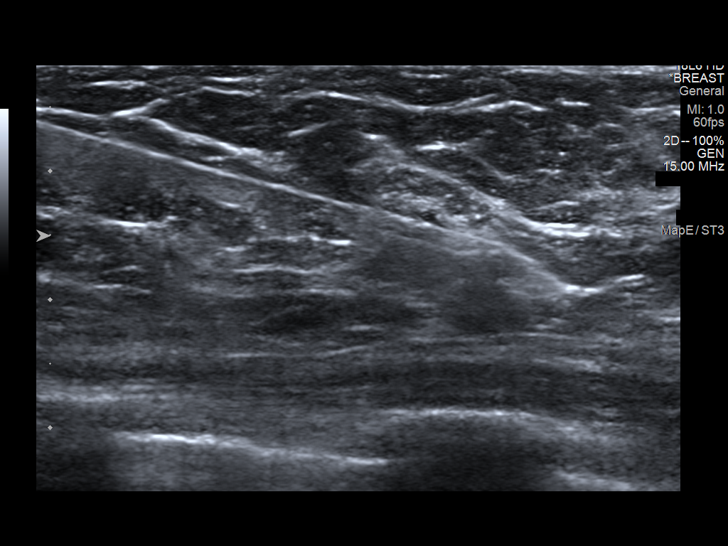
[im 15/17]
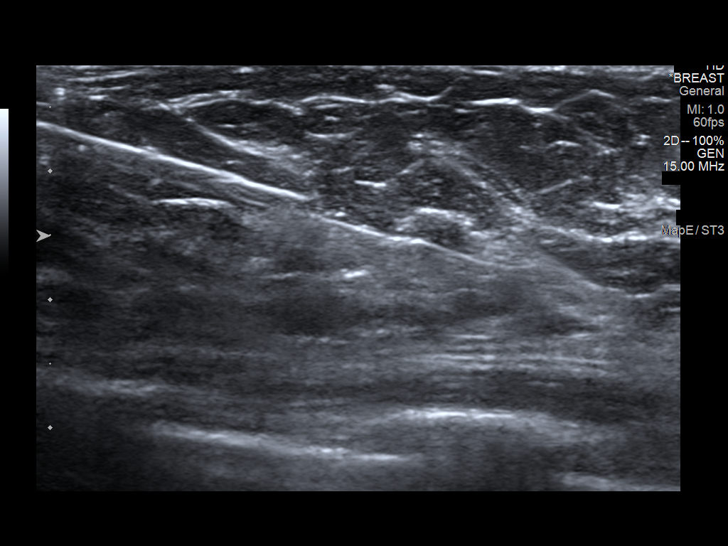
[im 17/17]
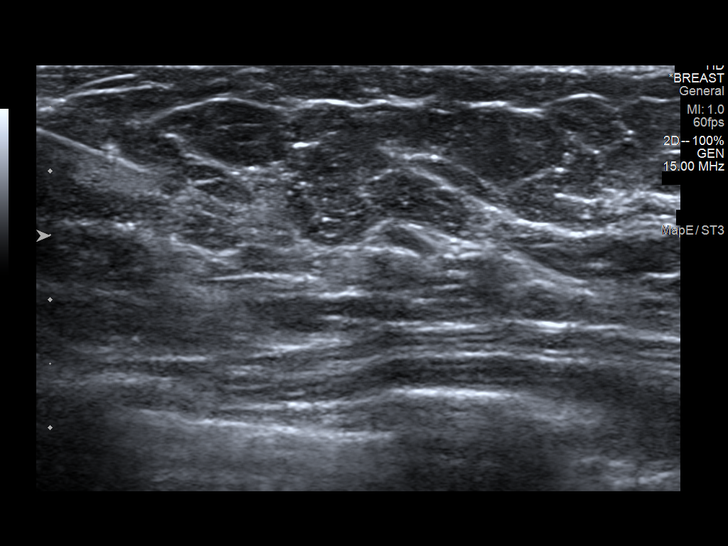

[12 of 17 positions shown; findings below may reference images not displayed]



Lesion quadrant: Upper outer quadrant

Using sterile technique and 1% Lidocaine as local anesthetic, under
direct ultrasound visualization, a 12 gauge TIGER device was
used to perform biopsy of the larger mass at the [DATE] position 6 cm
from the nipple using a inferior approach. At the conclusion of the
procedure ribbon shaped tissue marker clip was deployed into the
biopsy cavity.

Lesion quadrant: Upper outer quadrant

Using sterile technique and 1% Lidocaine as local anesthetic, under
direct ultrasound visualization, a 14 gauge TIGER device was
used to perform biopsy of the smaller mass at the [DATE] position 6 cm
from the nipple using a inferior approach. At the conclusion of the
procedure coil shaped tissue marker clip was deployed into the
biopsy cavity.

Follow up 2 view mammogram was performed and dictated separately.
IMPRESSION: Ultrasound guided biopsy of the left breast x2. No apparent
complications.

ADDENDUM:
Pathology revealed GRADE III INVASIVE DUCTAL CARCINOMA of the Left
breast, 2:30 o'clock, [WG], (ribbon clip). This was found to be
concordant by Dr. TIGER.

Pathology revealed GRADE III INVASIVE DUCTAL CARCINOMA of the Left
breast, 2:30 o'clock, [WG], (coil clip). This was found to be
concordant by Dr. TIGER.

Pathology results were discussed with the patient by telephone. The
patient reported doing well after the biopsies with tenderness at
the sites. Post biopsy instructions and care were reviewed and
questions were answered. The patient was encouraged to call The
direct phone number was provided.

The patient was referred to [REDACTED]
[REDACTED] at [REDACTED] on
[DATE].

Recommendation for bilateral breast MRI due to heterogeneously dense
breasts and the complexity of the patient's breast pattern. If the
patient is considering breast conservation, additional biopsies will
be necessary, but will be determined after the breast MRI.

Pathology results reported by TIGER, RN on [DATE].



Lesion quadrant: Upper outer quadrant

Using sterile technique and 1% Lidocaine as local anesthetic, under
direct ultrasound visualization, a 12 gauge TIGER device was
used to perform biopsy of the larger mass at the [DATE] position 6 cm
from the nipple using a inferior approach. At the conclusion of the
procedure ribbon shaped tissue marker clip was deployed into the
biopsy cavity.

Lesion quadrant: Upper outer quadrant

Using sterile technique and 1% Lidocaine as local anesthetic, under
direct ultrasound visualization, a 14 gauge TIGER device was
used to perform biopsy of the smaller mass at the [DATE] position 6 cm
from the nipple using a inferior approach. At the conclusion of the
procedure coil shaped tissue marker clip was deployed into the
biopsy cavity.

Follow up 2 view mammogram was performed and dictated separately.
IMPRESSION: Ultrasound guided biopsy of the left breast x2. No apparent
complications.

## 2019-12-19 ENCOUNTER — Encounter: Payer: Self-pay | Admitting: *Deleted

## 2019-12-19 DIAGNOSIS — C50412 Malignant neoplasm of upper-outer quadrant of left female breast: Secondary | ICD-10-CM

## 2019-12-19 DIAGNOSIS — Z170 Estrogen receptor positive status [ER+]: Secondary | ICD-10-CM | POA: Insufficient documentation

## 2019-12-23 NOTE — Progress Notes (Signed)
[place] NOTE  Patient Care Team: Maury Dus, MD as PCP - General (Family Medicine) Mauro Kaufmann, RN as Oncology Nurse Navigator Rockwell Germany, RN as Oncology Nurse Navigator Alphonsa Overall, MD as Consulting Physician (General Surgery) Nicholas Lose, MD as Consulting Physician (Hematology and Oncology) Kyung Rudd, MD as Consulting Physician (Radiation Oncology)  CHIEF COMPLAINTS/PURPOSE OF CONSULTATION:  Newly diagnosed breast cancer  HISTORY OF PRESENTING ILLNESS:  Beverly Mcclain 59 y.o. female is here because of recent diagnosis of invasive ductal carcinoma of the left breast. Screening mammogram on 11/14/19 showed a possible mass and 2 possible asymmetries in the left breast. Diagnostic mammogram on 12/03/19 showed two masses 1.4cm apart in the left breast at the 2:30 position, 0.7cm and 1.1cm, and two likely cysts, a 0.5cm mass a the 3:00 position, and a 0.9cm mass at the 11:00 position. Biopsy on 12/15/19 showed invasive ductal carcinoma in both masses at the 2:30 position, grade 3, HER-2 equivocal by IHC (2+), negative by FISH, ER+ 80%, PR- 0%, KI67 90%. She presents to the clinic today for initial evaluation and discussion of treatment options.  Her major complaints are related to neck and upper back pain and discomfort.  I reviewed her records extensively and collaborated the history with the patient.  SUMMARY OF ONCOLOGIC HISTORY: Oncology History  Malignant neoplasm of upper-outer quadrant of left breast in female, estrogen receptor positive ([place])  12/19/2019 Initial Diagnosis   Screening mammogram showed a possible mass and 2 asymmetries in the left breast. Diagnostic mammogram showed two masses 1.4cm apart in the left breast at the 2:30 position, 0.7cm and 1.1cm, and two likely cysts, a 0.5cm mass a the 3:00 position, and a 0.9cm mass at the 11:00 position. Biopsy showed IDC in both masses at the 2:30 position, grade 3, HER-2 equivocal by IHC  (2+), negative by FISH, ER+ 80%, PR- 0%, KI67 90%.   12/24/2019 Cancer Staging   Staging form: Breast, AJCC 8th Edition - Clinical stage from 12/24/2019: Stage IB (cT1c, cN0, cM0, G3, ER+, PR-, HER2-) - Signed by Nicholas Lose, MD on 12/24/2019     MEDICAL HISTORY:  No past medical history on file.  SURGICAL HISTORY: No past surgical history on file.  SOCIAL HISTORY: Social History   Socioeconomic History  . Marital status: Married    Spouse name: Not on file  . Number of children: Not on file  . Years of education: Not on file  . Highest education level: Not on file  Occupational History  . Not on file  Tobacco Use  . Smoking status: Not on file  Substance and Sexual Activity  . Alcohol use: Not on file  . Drug use: Not on file  . Sexual activity: Not on file  Other Topics Concern  . Not on file  Social History Narrative  . Not on file   Social Determinants of Health   Financial Resource Strain:   . Difficulty of Paying Living Expenses:   Food Insecurity:   . Worried About Charity fundraiser in the Last Year:   . Arboriculturist in the Last Year:   Transportation Needs:   . Film/video editor (Medical):   Marland Kitchen Lack of Transportation (Non-Medical):   Physical Activity:   . Days of Exercise per Week:   . Minutes of Exercise per Session:   Stress:   . Feeling of Stress :   Social Connections:   . Frequency of Communication with Friends and  Family:   . Frequency of Social Gatherings with Friends and Family:   . Attends Religious Services:   . Active Member of Clubs or Organizations:   . Attends Archivist Meetings:   Marland Kitchen Marital Status:   Intimate Partner Violence:   . Fear of Current or Ex-Partner:   . Emotionally Abused:   Marland Kitchen Physically Abused:   . Sexually Abused:     FAMILY HISTORY: No family history on file.  ALLERGIES:  has no allergies on file.  MEDICATIONS:  No current outpatient medications on file.   No current facility-administered  medications for this visit.    REVIEW OF SYSTEMS:   Constitutional: Denies fevers, chills or abnormal night sweats Eyes: Denies blurriness of vision, double vision or watery eyes Ears, nose, mouth, throat, and face: Denies mucositis or sore throat Respiratory: Denies cough, dyspnea or wheezes Cardiovascular: Denies palpitation, chest discomfort or lower extremity swelling Gastrointestinal:  Denies nausea, heartburn or change in bowel habits Skin: Denies abnormal skin rashes Lymphatics: Denies new lymphadenopathy or easy bruising Neurological:Denies numbness, tingling or new weaknesses Behavioral/Psych: Mood is stable, no new changes  Breast: Denies any palpable lumps or discharge All other systems were reviewed with the patient and are negative.  PHYSICAL EXAMINATION: ECOG PERFORMANCE STATUS: 1 - Symptomatic but completely ambulatory  Vitals:   12/24/19 0859  BP: 115/71  Pulse: 65  Resp: 20  Temp: 98.7 F (37.1 C)  SpO2: 100%   Filed Weights   12/24/19 0859  Weight: 147 lb 9.6 oz (67 kg)    GENERAL:alert, no distress and comfortable SKIN: skin color, texture, turgor are normal, no rashes or significant lesions EYES: normal, conjunctiva are pink and non-injected, sclera clear OROPHARYNX:no exudate, no erythema and lips, buccal mucosa, and tongue normal  NECK: supple, thyroid normal size, non-tender, without nodularity LYMPH:  no palpable lymphadenopathy in the cervical, axillary or inguinal LUNGS: clear to auscultation and percussion with normal breathing effort HEART: regular rate & rhythm and no murmurs and no lower extremity edema ABDOMEN:abdomen soft, non-tender and normal bowel sounds Musculoskeletal:no cyanosis of digits and no clubbing  PSYCH: alert & oriented x 3 with fluent speech NEURO: no focal motor/sensory deficits BREAST: No palpable nodules in breast. No palpable axillary or supraclavicular lymphadenopathy (exam performed in the presence of a chaperone)    LABORATORY DATA:  I have reviewed the data as listed Lab Results  Component Value Date   WBC 6.1 12/24/2019   HGB 13.4 12/24/2019   HCT 39.7 12/24/2019   MCV 89.2 12/24/2019   PLT 278 12/24/2019   Lab Results  Component Value Date   NA 140 12/24/2019   K 4.1 12/24/2019   CL 107 12/24/2019   CO2 26 12/24/2019    RADIOGRAPHIC STUDIES: I have personally reviewed the radiological reports and agreed with the findings in the report.  ASSESSMENT AND PLAN:  Malignant neoplasm of upper-outer quadrant of left breast in female, estrogen receptor positive ([place]) 12/19/2019: Screening mammogram showed a possible mass and 2 asymmetries in the left breast. Diagnostic mammogram showed two masses 1.4cm apart in the left breast at the 2:30 position, 0.7cm and 1.1cm,  Biopsy showed IDC in both masses at the 2:30 position, grade 3, HER-2 equivocal by IHC (2+), negative by FISH, ER+ 80%, PR- 0%, KI67 90%. T1 cN 0 stage Ib clinical stage  Pathology and radiology counseling:Discussed with the patient, the details of pathology including the type of breast cancer,the clinical staging, the significance of ER, PR and HER-2/neu  receptors and the implications for treatment. After reviewing the pathology in detail, we proceeded to discuss the different treatment options between surgery, radiation, chemotherapy, antiestrogen therapies.  Recommendations:  Breast MRI: Will help with determining if she needs additional biopsies CT chest abdomen pelvis and bone scan because of her diffuse aches and pains to rule out metastatic disease.  1. Breast conserving surgery with sentinel lymph node biopsy followed by 2. Oncotype DX testing to determine if chemotherapy (we will do it on the biopsy so that if she needs chemo she can have a port placement with her surgery) 3. Adjuvant radiation therapy followed by 4. Adjuvant antiestrogen therapy  Oncotype counseling: I discussed Oncotype DX test. I explained to the patient  that this is a 21 gene panel to evaluate patient tumors DNA to calculate recurrence score. This would help determine whether patient has high risk or intermediate risk or low risk breast cancer. She understands that if her tumor was found to be high risk, she would benefit from systemic chemotherapy. If low risk, no need of chemotherapy. If she was found to be intermediate risk, we would need to evaluate the score as well as other risk factors and determine if an abbreviated chemotherapy may be of benefit.  Return to clinic after surgery to discuss final pathology report and then determine if Oncotype DX testing will need to be sent.   All questions were answered. The patient knows to call the clinic with any problems, questions or concerns.   Rulon Eisenmenger, MD, MPH 12/24/2019    I, Molly Dorshimer, am acting as scribe for Nicholas Lose, MD.  I have reviewed the above documentation for accuracy and completeness, and I agree with the above.

## 2019-12-24 ENCOUNTER — Inpatient Hospital Stay: Payer: 59

## 2019-12-24 ENCOUNTER — Inpatient Hospital Stay: Payer: 59 | Admitting: Licensed Clinical Social Worker

## 2019-12-24 ENCOUNTER — Encounter: Payer: Self-pay | Admitting: *Deleted

## 2019-12-24 ENCOUNTER — Other Ambulatory Visit: Payer: Self-pay

## 2019-12-24 ENCOUNTER — Ambulatory Visit: Payer: 59 | Attending: Surgery | Admitting: Physical Therapy

## 2019-12-24 ENCOUNTER — Encounter: Payer: Self-pay | Admitting: Physical Therapy

## 2019-12-24 ENCOUNTER — Inpatient Hospital Stay (HOSPITAL_BASED_OUTPATIENT_CLINIC_OR_DEPARTMENT_OTHER): Payer: 59 | Admitting: Hematology and Oncology

## 2019-12-24 ENCOUNTER — Telehealth: Payer: Self-pay | Admitting: *Deleted

## 2019-12-24 ENCOUNTER — Ambulatory Visit
Admission: RE | Admit: 2019-12-24 | Discharge: 2019-12-24 | Disposition: A | Discharge status: Home / Self Care | Payer: 59 | Source: Ambulatory Visit | Attending: Radiation Oncology | Admitting: Radiation Oncology

## 2019-12-24 DIAGNOSIS — C50412 Malignant neoplasm of upper-outer quadrant of left female breast: Secondary | ICD-10-CM

## 2019-12-24 DIAGNOSIS — R293 Abnormal posture: Secondary | ICD-10-CM | POA: Insufficient documentation

## 2019-12-24 DIAGNOSIS — Z79899 Other long term (current) drug therapy: Secondary | ICD-10-CM | POA: Insufficient documentation

## 2019-12-24 DIAGNOSIS — Z170 Estrogen receptor positive status [ER+]: Secondary | ICD-10-CM | POA: Insufficient documentation

## 2019-12-24 DIAGNOSIS — M546 Pain in thoracic spine: Secondary | ICD-10-CM | POA: Insufficient documentation

## 2019-12-24 LAB — CBC WITH DIFFERENTIAL (CANCER CENTER ONLY)
Abs Immature Granulocytes: 0.01 10*3/uL (ref 0.00–0.07)
Basophils Absolute: 0 10*3/uL (ref 0.0–0.1)
Basophils Relative: 1 %
Eosinophils Absolute: 0.2 10*3/uL (ref 0.0–0.5)
Eosinophils Relative: 3 %
HCT: 39.7 % (ref 36.0–46.0)
Hemoglobin: 13.4 g/dL (ref 12.0–15.0)
Immature Granulocytes: 0 %
Lymphocytes Relative: 36 %
Lymphs Abs: 2.2 10*3/uL (ref 0.7–4.0)
MCH: 30.1 pg (ref 26.0–34.0)
MCHC: 33.8 g/dL (ref 30.0–36.0)
MCV: 89.2 fL (ref 80.0–100.0)
Monocytes Absolute: 0.5 10*3/uL (ref 0.1–1.0)
Monocytes Relative: 9 %
Neutro Abs: 3.2 10*3/uL (ref 1.7–7.7)
Neutrophils Relative %: 51 %
Platelet Count: 278 10*3/uL (ref 150–400)
RBC: 4.45 MIL/uL (ref 3.87–5.11)
RDW: 12.2 % (ref 11.5–15.5)
WBC Count: 6.1 10*3/uL (ref 4.0–10.5)
nRBC: 0 % (ref 0.0–0.2)

## 2019-12-24 LAB — CMP (CANCER CENTER ONLY)
ALT: 12 U/L (ref 0–44)
AST: 15 U/L (ref 15–41)
Albumin: 4.2 g/dL (ref 3.5–5.0)
Alkaline Phosphatase: 82 U/L (ref 38–126)
Anion gap: 7 (ref 5–15)
BUN: 14 mg/dL (ref 6–20)
CO2: 26 mmol/L (ref 22–32)
Calcium: 10 mg/dL (ref 8.9–10.3)
Chloride: 107 mmol/L (ref 98–111)
Creatinine: 0.8 mg/dL (ref 0.44–1.00)
GFR, Est AFR Am: >60 mL/min (ref >60)
GFR, Estimated: >60 mL/min (ref >60)
Glucose, Bld: 92 mg/dL (ref 70–99)
Potassium: 4.1 mmol/L (ref 3.5–5.1)
Sodium: 140 mmol/L (ref 135–145)
Total Bilirubin: 0.3 mg/dL (ref 0.3–1.2)
Total Protein: 7.1 g/dL (ref 6.5–8.1)

## 2019-12-24 LAB — GENETIC SCREENING ORDER

## 2019-12-24 NOTE — Progress Notes (Addendum)
Radiation Oncology         [phone] ________________________________  Name: Beverly Mcclain        MRN: [id number]  Date of Service: 12/24/2019 DOB: 1960/08/29  IR:JJOAC, Beverly [place], MD  Alphonsa Overall, MD     REFERRING PHYSICIAN: Alphonsa Overall, MD   DIAGNOSIS: The encounter diagnosis was Malignant neoplasm of upper-outer quadrant of left breast in female, estrogen receptor positive ([place]).   HISTORY OF PRESENT ILLNESS: Beverly Mcclain is a 59 y.o. female seen in the multidisciplinary breast clinic for a new diagnosis of left breast cancer. The patient was noted to have a screening detected mass in the left breast and focal assymmetry. Additional diagnostic imaging revealed a mass at 2:30 position measuring 7 x 5 x 4 mm, and there was a second mass also at 2:30 measuring 1.1 x 1.1 x 1.7 cm, and her axilla was negative for adenopathy. There were also two other masses at 3:00 and 11:00 which were not sampled. She underwent a biopsy on 12/15/19 of both 2:30 lesions and they were both consistent with grade 3 invasive ductal carcinoma and her tumor was weakly ER positive, PR negative, and HER2 negative reflexing to FISH. She comes today to discuss treatment recommendations for her cancer.    PREVIOUS RADIATION THERAPY: No   PAST MEDICAL HISTORY:  Past Medical History:  Diagnosis Date  . Anxiety        PAST SURGICAL HISTORY: Past Surgical History:  Procedure Laterality Date  . CESAREAN SECTION    . polyp removal       FAMILY HISTORY: No family history on file.   SOCIAL HISTORY:  reports that she quit smoking about 31 years ago. Her smoking use included cigarettes. She does not have any smokeless tobacco history on file. She reports current alcohol use. She reports current drug use. Drug: Marijuana. The patient is married and lives in [place].    ALLERGIES: Patient has no allergy information on record.   MEDICATIONS:  No current outpatient medications on file.   No  current facility-administered medications for this encounter.     REVIEW OF SYSTEMS: On review of systems, the patient reports that she is doing well overall. She denies any chest pain, shortness of breath, cough, fevers, chills, night sweats, unintended weight changes. She denies any bowel or bladder disturbances, and denies abdominal pain, nausea or vomiting. She denies any new musculoskeletal or joint aches or pains. A complete review of systems is obtained and is otherwise negative.     PHYSICAL EXAM:  Wt Readings from Last 3 Encounters:  12/24/19 147 lb 9.6 oz (67 kg)   Temp Readings from Last 3 Encounters:  12/24/19 98.7 F (37.1 C) (Temporal)   BP Readings from Last 3 Encounters:  12/24/19 115/71   Pulse Readings from Last 3 Encounters:  12/24/19 65    In general this is a well appearing caucasian female in no acute distress. She's alert and oriented x4 and appropriate throughout the examination. Cardiopulmonary assessment is negative for acute distress and she exhibits normal effort. Bilateral breast exam is deferred.    ECOG = 0  0 - Asymptomatic (Fully active, able to carry on all predisease activities without restriction)  1 - Symptomatic but completely ambulatory (Restricted in physically strenuous activity but ambulatory and able to carry out work of a light or sedentary nature. For example, light housework, office work)  2 - Symptomatic, <50% in bed during the day (Ambulatory and capable of all self  care but unable to carry out any work activities. Up and about more than 50% of waking hours)  3 - Symptomatic, >50% in bed, but not bedbound (Capable of only limited self-care, confined to bed or chair 50% or more of waking hours)  4 - Bedbound (Completely disabled. Cannot carry on any self-care. Totally confined to bed or chair)  5 - Death   Eustace Pen MM, Creech RH, Tormey DC, et al. [phone]). "Toxicity and response criteria of the [facility]". [place] Oncol. 5 (6): 649-55    LABORATORY DATA:  Lab Results  Component Value Date   WBC 6.1 12/24/2019   HGB 13.4 12/24/2019   HCT 39.7 12/24/2019   MCV 89.2 12/24/2019   PLT 278 12/24/2019   Lab Results  Component Value Date   NA 140 12/24/2019   K 4.1 12/24/2019   CL 107 12/24/2019   CO2 26 12/24/2019   Lab Results  Component Value Date   ALT 12 12/24/2019   AST 15 12/24/2019   ALKPHOS 82 12/24/2019   BILITOT 0.3 12/24/2019      RADIOGRAPHY: US BREAST LTD UNI LEFT INC AXILLA  Addendum Date: 12/22/2019   ADDENDUM REPORT: 12/22/2019 12:15 ADDENDUM: All breast masses are on the left. A mass was described on the right in the original findings section. This is incorrect. Electronically Signed   By: Dorise Bullion III M.D   On: 12/22/2019 12:15   Result Date: 12/22/2019 CLINICAL DATA:  The patient was called back for left-sided masses and asymmetries. EXAM: DIGITAL DIAGNOSTIC LEFT MAMMOGRAM ULTRASOUND LEFT BREAST COMPARISON:  Previous exam(s). ACR Breast Density Category c: The breast tissue is heterogeneously dense, which may obscure small masses. FINDINGS: There is a mass in the upper outer right breast at a posterior depth. Just superior and posterior to this mass is another irregular mass projected over the pectoralis muscle seen on the MLO view only. There are 1 or 2 masses in the anterior aspect of the lateral left breast best seen on the cc view. An asymmetry in the posteromedial left breast improves but does not completely resolve on additional imaging. A final asymmetry in the far medial left breast does resolve on additional imaging On physical exam, no suspicious lumps are identified. Targeted ultrasound is performed, showing an irregular mass in the left breast at 2:30, 6 cm from the nipple measuring 7 x 5 x 4 mm. An adjacent mass is seen at 2:30, 6 cm from the nipple measuring 11 by 11 x 7 mm. These 2 masses are 1.4 cm apart. A hypoechoic mass with internal cystic  structures is seen at 3 o'clock, 3 cm from the nipple measuring 4 x 5 x 5 mm, likely focal fibrocystic change or a for 2 metaplasia. Another mass in the left breast at 11 o'clock, 3 cm from the nipple measures 8 x 9 x 4 mm, likely either a fibroid or a for could metaplasia. No other abnormal masses are seen in the left breast. No left axillary adenopathy. IMPRESSION: 1. Multiple left breast masses. The masses at 2:30, 6 cm from the nipple are the most suspicious. The other masses are probably benign. RECOMMENDATION: Recommend ultrasound-guided biopsy of the left breast masses at 2:30, 6 cm from the nipple measuring 7 and 11 mm. If these biopsies are benign, recommend six-month follow-up of the additional left breast masses with six-month follow-up mammogram and ultrasound. If these masses or malignant, recommend breast MRI due to the complexity of the  patient's breast pattern. If the biopsies are malignant and the patient is considering breast conservation, additional biopsies will be necessary but will be determined after the breast MRI. I have discussed the findings and recommendations with the patient. If applicable, a reminder letter will be sent to the patient regarding the next appointment. BI-RADS CATEGORY  4: Suspicious. Electronically Signed: By: Dorise Bullion III M.D On: 12/03/2019 12:35   MM DIAG BREAST TOMO UNI LEFT  Addendum Date: 12/22/2019   ADDENDUM REPORT: 12/22/2019 12:15 ADDENDUM: All breast masses are on the left. A mass was described on the right in the original findings section. This is incorrect. Electronically Signed   By: Dorise Bullion III M.D   On: 12/22/2019 12:15   Result Date: 12/22/2019 CLINICAL DATA:  The patient was called back for left-sided masses and asymmetries. EXAM: DIGITAL DIAGNOSTIC LEFT MAMMOGRAM ULTRASOUND LEFT BREAST COMPARISON:  Previous exam(s). ACR Breast Density Category c: The breast tissue is heterogeneously dense, which may obscure small masses. FINDINGS:  There is a mass in the upper outer right breast at a posterior depth. Just superior and posterior to this mass is another irregular mass projected over the pectoralis muscle seen on the MLO view only. There are 1 or 2 masses in the anterior aspect of the lateral left breast best seen on the cc view. An asymmetry in the posteromedial left breast improves but does not completely resolve on additional imaging. A final asymmetry in the far medial left breast does resolve on additional imaging On physical exam, no suspicious lumps are identified. Targeted ultrasound is performed, showing an irregular mass in the left breast at 2:30, 6 cm from the nipple measuring 7 x 5 x 4 mm. An adjacent mass is seen at 2:30, 6 cm from the nipple measuring 11 by 11 x 7 mm. These 2 masses are 1.4 cm apart. A hypoechoic mass with internal cystic structures is seen at 3 o'clock, 3 cm from the nipple measuring 4 x 5 x 5 mm, likely focal fibrocystic change or a for 2 metaplasia. Another mass in the left breast at 11 o'clock, 3 cm from the nipple measures 8 x 9 x 4 mm, likely either a fibroid or a for could metaplasia. No other abnormal masses are seen in the left breast. No left axillary adenopathy. IMPRESSION: 1. Multiple left breast masses. The masses at 2:30, 6 cm from the nipple are the most suspicious. The other masses are probably benign. RECOMMENDATION: Recommend ultrasound-guided biopsy of the left breast masses at 2:30, 6 cm from the nipple measuring 7 and 11 mm. If these biopsies are benign, recommend six-month follow-up of the additional left breast masses with six-month follow-up mammogram and ultrasound. If these masses or malignant, recommend breast MRI due to the complexity of the patient's breast pattern. If the biopsies are malignant and the patient is considering breast conservation, additional biopsies will be necessary but will be determined after the breast MRI. I have discussed the findings and recommendations with the  patient. If applicable, a reminder letter will be sent to the patient regarding the next appointment. BI-RADS CATEGORY  4: Suspicious. Electronically Signed: By: Dorise Bullion III M.D On: 12/03/2019 12:35   MM CLIP PLACEMENT LEFT  Result Date: 12/15/2019 CLINICAL DATA:  59 year old female status post 2 area ultrasound-guided biopsy of the left breast. EXAM: DIAGNOSTIC LEFT MAMMOGRAM POST ULTRASOUND BIOPSY COMPARISON:  Previous exam(s). FINDINGS: Mammographic images were obtained following ultrasound guided biopsy of the left breast. The biopsy marking clips  are in the expected positions after biopsy. IMPRESSION: Appropriate positioning of the ribbon and coil shaped biopsy marking clips at the sites of biopsy in the upper-outer left breast. Final Assessment: Post Procedure Mammograms for Marker Placement Electronically Signed   By: Kristopher Oppenheim M.D.   On: 12/15/2019 14:49   [place] LT BREAST BX W LOC DEV 1ST LESION IMG BX SPEC US GUIDE  Addendum Date: 12/16/2019   ADDENDUM REPORT: 12/16/2019 13:10 ADDENDUM: Pathology revealed GRADE III INVASIVE DUCTAL CARCINOMA of the Left breast, 2:30 o'clock, 6cmfn, (ribbon clip). This was found to be concordant by Dr. Kristopher Oppenheim. Pathology revealed GRADE III INVASIVE DUCTAL CARCINOMA of the Left breast, 2:30 o'clock, 6cmfn, (coil clip). This was found to be concordant by Dr. Kristopher Oppenheim. Pathology results were discussed with the patient by telephone. The patient reported doing well after the biopsies with tenderness at the sites. Post biopsy instructions and care were reviewed and questions were answered. The patient was encouraged to call [facility] for any additional concerns. My direct phone number was provided. The patient was referred to [facility] on December 24, 2019. Recommendation for bilateral breast MRI due to heterogeneously dense breasts and the  complexity of the patient's breast pattern. If the patient is considering breast conservation, additional biopsies will be necessary, but will be determined after the breast MRI. Pathology results reported by Terie Purser, RN on 12/16/2019. Electronically Signed   By: Kristopher Oppenheim M.D.   On: 12/16/2019 13:10   Result Date: 12/16/2019 CLINICAL DATA:  59 year old female with 2 indeterminate left breast masses. EXAM: ULTRASOUND GUIDED LEFT BREAST CORE NEEDLE BIOPSY x2 COMPARISON:  Previous exam(s). PROCEDURE: I met with the patient and we discussed the procedure of ultrasound-guided biopsy, including benefits and alternatives. We discussed the high likelihood of a successful procedure. We discussed the risks of the procedure, including infection, bleeding, tissue injury, clip migration, and inadequate sampling. Informed written consent was given. The usual time-out protocol was performed immediately prior to the procedure. Lesion quadrant: Upper outer quadrant Using sterile technique and 1% Lidocaine as local anesthetic, under direct ultrasound visualization, a 12 gauge spring-loaded device was used to perform biopsy of the larger mass at the 2:30 position 6 cm from the nipple using a inferior approach. At the conclusion of the procedure ribbon shaped tissue marker clip was deployed into the biopsy cavity. Lesion quadrant: Upper outer quadrant Using sterile technique and 1% Lidocaine as local anesthetic, under direct ultrasound visualization, a 14 gauge spring-loaded device was used to perform biopsy of the smaller mass at the 2:30 position 6 cm from the nipple using a inferior approach. At the conclusion of the procedure coil shaped tissue marker clip was deployed into the biopsy cavity. Follow up 2 view mammogram was performed and dictated separately. IMPRESSION: Ultrasound guided biopsy of the left breast x2. No apparent complications. Electronically Signed: By: Kristopher Oppenheim M.D. On: 12/15/2019 14:48   [place] LT  BREAST BX W LOC DEV EA ADD LESION IMG BX SPEC US GUIDE  Addendum Date: 12/16/2019   ADDENDUM REPORT: 12/16/2019 13:10 ADDENDUM: Pathology revealed GRADE III INVASIVE DUCTAL CARCINOMA of the Left breast, 2:30 o'clock, 6cmfn, (ribbon clip). This was found to be concordant by Dr. Kristopher Oppenheim. Pathology revealed GRADE III INVASIVE DUCTAL CARCINOMA of the Left breast, 2:30 o'clock, 6cmfn, (coil clip). This was found to be concordant by Dr. Kristopher Oppenheim. Pathology results were discussed with the  patient by telephone. The patient reported doing well after the biopsies with tenderness at the sites. Post biopsy instructions and care were reviewed and questions were answered. The patient was encouraged to call [facility] [place] for any additional concerns. My direct phone number was provided. The patient was referred to [facility] on December 24, 2019. Recommendation for bilateral breast MRI due to heterogeneously dense breasts and the complexity of the patient's breast pattern. If the patient is considering breast conservation, additional biopsies will be necessary, but will be determined after the breast MRI. Pathology results reported by Terie Purser, RN on 12/16/2019. Electronically Signed   By: Kristopher Oppenheim M.D.   On: 12/16/2019 13:10   Result Date: 12/16/2019 CLINICAL DATA:  59 year old female with 2 indeterminate left breast masses. EXAM: ULTRASOUND GUIDED LEFT BREAST CORE NEEDLE BIOPSY x2 COMPARISON:  Previous exam(s). PROCEDURE: I met with the patient and we discussed the procedure of ultrasound-guided biopsy, including benefits and alternatives. We discussed the high likelihood of a successful procedure. We discussed the risks of the procedure, including infection, bleeding, tissue injury, clip migration, and inadequate sampling. Informed written consent was given. The usual time-out protocol was performed  immediately prior to the procedure. Lesion quadrant: Upper outer quadrant Using sterile technique and 1% Lidocaine as local anesthetic, under direct ultrasound visualization, a 12 gauge spring-loaded device was used to perform biopsy of the larger mass at the 2:30 position 6 cm from the nipple using a inferior approach. At the conclusion of the procedure ribbon shaped tissue marker clip was deployed into the biopsy cavity. Lesion quadrant: Upper outer quadrant Using sterile technique and 1% Lidocaine as local anesthetic, under direct ultrasound visualization, a 14 gauge spring-loaded device was used to perform biopsy of the smaller mass at the 2:30 position 6 cm from the nipple using a inferior approach. At the conclusion of the procedure coil shaped tissue marker clip was deployed into the biopsy cavity. Follow up 2 view mammogram was performed and dictated separately. IMPRESSION: Ultrasound guided biopsy of the left breast x2. No apparent complications. Electronically Signed: By: Kristopher Oppenheim M.D. On: 12/15/2019 14:48       IMPRESSION/PLAN: 1. 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 Dr. Lisbeth Renshaw discusses the pathology findings and reviews the nature of left breast disease. The consensus from the breast conference includes proceeding with an MRI to rule out need to biopsy the two other masses seen on her diagnostic imaging to date. Dr. Lindi Adie recommends Oncotype Dx score on her biopsy to determine a role for systemic therapy. Provided that chemotherapy is not indicated, the patient's course would then be to proceed surgically, and if she has breast conservationfollowed by external radiotherapy to the breast followed by antiestrogen therapy. We discussed the risks, benefits, short, and long term effects of radiotherapy, and the patient is interested in proceeding. Dr. Lisbeth Renshaw discusses the delivery and logistics of radiotherapy and anticipates a course of 4- 6 1/2  weeks of radiotherapy with deep inspiration breath hold technique. We will see her back about 2 weeks after surgery to discuss the simulation process and anticipate we starting radiotherapy about 4-6 weeks after surgery.    In a visit lasting 60 minutes, greater than 50% of the time was spent face to face reviewing her case, as well as in preparation of, discussing, and coordinating the patient's care.  The above documentation reflects my  direct findings during this shared patient visit. Please see the separate note by Dr. Lisbeth Renshaw on this date for the remainder of the patient's plan of care.    Carola Rhine, PAC

## 2019-12-24 NOTE — Assessment & Plan Note (Signed)
12/19/2019: Screening mammogram showed a possible mass and 2 asymmetries in the left breast. Diagnostic mammogram showed two masses 1.4cm apart in the left breast at the 2:30 position, 0.7cm and 1.1cm,  Biopsy showed IDC in both masses at the 2:30 position, grade 3, HER-2 equivocal by IHC (2+), negative by FISH, ER+ 80%, PR- 0%, KI67 90%. T1 cN 0 stage Ib clinical stage  Pathology and radiology counseling:Discussed with the patient, the details of pathology including the type of breast cancer,the clinical staging, the significance of ER, PR and HER-2/neu receptors and the implications for treatment. After reviewing the pathology in detail, we proceeded to discuss the different treatment options between surgery, radiation, chemotherapy, antiestrogen therapies.  Recommendations: Breast MRI 1. Breast conserving surgery with sentinel lymph node biopsy followed by 2. Oncotype DX testing to determine if chemotherapy would be of any benefit followed by 3. Adjuvant radiation therapy followed by 4. Adjuvant antiestrogen therapy  Oncotype counseling: I discussed Oncotype DX test. I explained to the patient that this is a 21 gene panel to evaluate patient tumors DNA to calculate recurrence score. This would help determine whether patient has high risk or intermediate risk or low risk breast cancer. She understands that if her tumor was found to be high risk, she would benefit from systemic chemotherapy. If low risk, no need of chemotherapy. If she was found to be intermediate risk, we would need to evaluate the score as well as other risk factors and determine if an abbreviated chemotherapy may be of benefit.  Return to clinic after surgery to discuss final pathology report and then determine if Oncotype DX testing will need to be sent.

## 2019-12-24 NOTE — Therapy (Signed)
[place], [place], [address] Phone: [phone]   Fax:  [phone]  Physical Therapy Evaluation  Patient Details  Name: Tayah Idrovo MRN: [id number] Date of Birth: 08/26/1960 Referring Provider (PT): Dr. Alphonsa Overall   Encounter Date: 12/24/2019   PT End of Session - 12/24/19 1154    Visit Number 1    Number of Visits 2    Date for PT Re-Evaluation 02/18/20    PT Start Time 0915    PT Stop Time 0934   Also saw pt from 1042-1054 for a total of 31 minutes   PT Time Calculation (min) 19 min    Activity Tolerance Patient tolerated treatment well    Behavior During Therapy [facility] for tasks assessed/performed           Past Medical History:  Diagnosis Date  . Anxiety     Past Surgical History:  Procedure Laterality Date  . CESAREAN SECTION    . polyp removal      There were no vitals filed for this visit.    Subjective Assessment - 12/24/19 1105    Subjective Patient reports she is here today to be seen by her medical team for her newly diagnosed left breast cancer.    Patient is accompained by: Family member    Pertinent History Patient was diagnosed on 10/30/2019 with left grade III invasive ductal carcinoma breast cancer. There are 2 masses measuring 1.1 cm and 7 mm in the upper outer quadrant. It is ER positive, PR negative and HER2 negative with a Ki67 of 90%. She has some right upper extremity tingling and numbness that she reports has been present several years and was previously diagnosed with right carpal tunnel syndrome.    Patient Stated Goals Reduce lymphedema risk and learn post op shoulder ROM HEP    Currently in Pain? Yes    Pain Score 8     Pain Location Arm    Pain Orientation Right    Pain Descriptors / Indicators Tingling;Numbness    Pain Type Chronic pain    Pain Radiating Towards Right hand    Pain Onset More than a month ago    Pain Frequency Intermittent     Aggravating Factors  Sitting    Pain Relieving Factors Standing              [facility] PT Assessment - 12/24/19 0001      Assessment   Medical Diagnosis Right breast cancer    Referring Provider (PT) Dr. Alphonsa Overall    Onset Date/Surgical Date 10/30/19    Hand Dominance Right    Prior Therapy none      Precautions   Precautions Other (comment)    Precaution Comments active cancer      Restrictions   Weight Bearing Restrictions No      Balance Screen   Has the patient fallen in the past 6 months No    Has the patient had a decrease in activity level because of a fear of falling?  No    Is the patient reluctant to leave their home because of a fear of falling?  No      Home Environment   Living Environment Private residence    Living Arrangements Spouse/significant other    Available Help at Discharge Family      Prior Function   Level of Independence Independent    Vocation Full time employment    Geophysicist/field seismologist houses  Leisure She does not exercise      Cognition   Overall Cognitive Status Within Functional Limits for tasks assessed      Posture/Postural Control   Posture/Postural Control Postural limitations    Postural Limitations Rounded Shoulders;Forward head      ROM / Strength   AROM / PROM / Strength AROM;Strength      AROM   AROM Assessment Site Shoulder;Cervical    Right/Left Shoulder Right;Left    Right Shoulder Extension 45 Degrees    Right Shoulder Flexion 147 Degrees    Right Shoulder ABduction 151 Degrees    Right Shoulder Internal Rotation 66 Degrees    Right Shoulder External Rotation 89 Degrees    Left Shoulder Extension 55 Degrees    Left Shoulder Flexion 150 Degrees    Left Shoulder ABduction 145 Degrees    Left Shoulder Internal Rotation 80 Degrees    Left Shoulder External Rotation 90 Degrees    Cervical Flexion WNL    Cervical Extension WNL    Cervical - Right Side Bend 25% limited    Cervical - Left Side Bend 25%  limited    Cervical - Right Rotation 25% limited    Cervical - Left Rotation 25% limited      Strength   Overall Strength Within functional limits for tasks performed             LYMPHEDEMA/ONCOLOGY QUESTIONNAIRE - 12/24/19 0001      Type   Cancer Type Left breast cancer      Lymphedema Assessments   Lymphedema Assessments Upper extremities      Right Upper Extremity Lymphedema   10 cm Proximal to Olecranon Process 28.9 cm    Olecranon Process 25.3 cm    10 cm Proximal to Ulnar Styloid Process 23.3 cm    Just Proximal to Ulnar Styloid Process 15.8 cm    Across Hand at [employer] 19.1 cm    At [place] of 2nd Digit 6 cm      Left Upper Extremity Lymphedema   10 cm Proximal to Olecranon Process 28.8 cm    Olecranon Process 25.1 cm    10 cm Proximal to Ulnar Styloid Process 21.3 cm    Just Proximal to Ulnar Styloid Process 15.1 cm    Across Hand at [employer] 18.3 cm    At [place] of 2nd Digit 5.8 cm           L-DEX FLOWSHEETS - 12/24/19 1100      L-DEX LYMPHEDEMA SCREENING   Measurement Type Unilateral    L-DEX MEASUREMENT EXTREMITY Upper Extremity    POSITION  Standing    DOMINANT SIDE Right    At Risk Side Left    BASELINE SCORE (UNILATERAL) -8.2          The patient was assessed using the L-Dex machine today to produce a lymphedema index baseline score. The patient will be reassessed on a regular basis (typically every 3 months) to obtain new L-Dex scores. If the score is > 6.5 points away from his/her baseline score indicating onset of subclinical lymphedema, it will be recommended to wear a compression garment for 4 weeks, 12 hours per day and then be reassessed. If the score continues to be > 6.5 points from baseline at reassessment, we will initiate lymphedema treatment. Assessing in this manner has a 95% rate of preventing clinically significant lymphedema.       Katina Dung - 12/24/19 0001    Open a  tight or new jar Moderate difficulty    Do heavy  household chores (wash walls, wash floors) Moderate difficulty    Carry a shopping bag or briefcase Mild difficulty    Wash your back No difficulty    Use a knife to cut food No difficulty    Recreational activities in which you take some force or impact through your arm, shoulder, or hand (golf, hammering, tennis) Severe difficulty    During the past week, to what extent has your arm, shoulder or hand problem interfered with your normal social activities with family, friends, neighbors, or groups? Quite a bit    During the past week, to what extent has your arm, shoulder or hand problem limited your work or other regular daily activities Modererately    Arm, shoulder, or hand pain. Moderate    Tingling (pins and needles) in your arm, shoulder, or hand Severe    Difficulty Sleeping Moderate difficulty    DASH Score 45.45 %            Objective measurements completed on examination: See above findings.      Patient was instructed today in a home exercise program today for post op shoulder range of motion. These included active assist shoulder flexion in sitting, scapular retraction, wall walking with shoulder abduction, and hands behind head external rotation.  She was encouraged to do these twice a day, holding 3 seconds and repeating 5 times when permitted by her physician.             PT Education - 12/24/19 1153    Education Details Lymphedema risk reduction and post op shoulder ROM HEP    Person(s) Educated Patient;Spouse    Methods Explanation;Demonstration;Handout    Comprehension Returned demonstration;Verbalized understanding               PT Long Term Goals - 12/24/19 1158      PT LONG TERM GOAL #1   Title Patient will demonstrate she has regained full shoulder ROM and function post operatively compared to baselines.    Time 8    Period Weeks    Status New    Target Date 02/18/20           Breast Clinic Goals - 12/24/19 1158      Patient will be  able to verbalize understanding of pertinent lymphedema risk reduction practices relevant to her diagnosis specifically related to skin care.   Time 1    Status Achieved      Patient will be able to return demonstrate and/or verbalize understanding of the post-op home exercise program related to regaining shoulder range of motion.   Time 1    Period Days    Status Achieved      Patient will be able to verbalize understanding of the importance of attending the postoperative After Breast Cancer Class for further lymphedema risk reduction education and therapeutic exercise.   Time 1    Period Days    Status Achieved                 Plan - 12/24/19 1156    Clinical Impression Statement Patient was diagnosed on 10/30/2019 with left grade III invasive ductal carcinoma breast cancer. There are 2 masses measuring 1.1 cm and 7 mm in the upper outer quadrant. It is ER positive, PR negative and HER2 negative with a Ki67 of 90%. She has some right upper extremity tingling and numbness that she reports has been present several years  and was previously diagnosed with right carpal tunnel syndrome. Her multidisciplinary medical team met prior to her assessments to determine a recommended treatment plan. She is planning to have a left lumpectomy and sentinel node biopsy followed by Oncotype testing, radiation, and anti-estrogen therapy. She will benefit from a post op PT reassessment to determine needs and from L-Dex screens every 3 months for 2 years to detect subclinical lymphedema.    Stability/Clinical Decision Making Stable/Uncomplicated    Clinical Decision Making Low    Rehab Potential Excellent    PT Frequency --   Eval and 1 f/u visit   PT Treatment/Interventions ADLs/Self Care Home Management;Therapeutic exercise;Patient/family education    PT Next Visit Plan Will reassess 3-4 weeks post op to determine needs    PT Home Exercise Plan Post op shoulder ROM HEP    Consulted and Agree with Plan  of Care Patient;Family member/caregiver    Family Member Consulted Husband           Patient will benefit from skilled therapeutic intervention in order to improve the following deficits and impairments:  Postural dysfunction, Decreased range of motion, Impaired UE functional use, Pain, Decreased knowledge of precautions  Visit Diagnosis: Malignant neoplasm of upper-outer quadrant of left breast in female, estrogen receptor positive ([place]) - Plan: PT plan of care cert/re-cert  Abnormal posture - Plan: PT plan of care cert/re-cert   Patient will follow up at outpatient cancer rehab 3-4 weeks following surgery.  If the patient requires physical therapy at that time, a specific plan will be dictated and sent to the referring physician for approval. The patient was educated today on appropriate basic range of motion exercises to begin post operatively and the importance of attending the After Breast Cancer class following surgery.  Patient was educated today on lymphedema risk reduction practices as it pertains to recommendations that will benefit the patient immediately following surgery.  She verbalized good understanding.     Problem List Patient Active Problem List   Diagnosis Date Noted  . Malignant neoplasm of upper-outer quadrant of left breast in female, estrogen receptor positive ([place]) 12/19/2019   Annia Friendly, PT 12/24/19 12:01 PM  [place], [place], [address] Phone: [phone]   Fax:  [phone]  Name: Estephany Perot MRN: [id number] Date of Birth: 26-Dec-1960

## 2019-12-24 NOTE — Patient Instructions (Signed)

## 2019-12-24 NOTE — Telephone Encounter (Signed)
Received order for oncotype testing on CORE bx. Requisition faxed to [facility] and [facility]. 

## 2019-12-24 NOTE — Progress Notes (Signed)
Beverly Mcclain INITIAL SDOH Screening Note   Beverly Mcclain is a 59 y.o. year old female accompanied by husband, Beverly Mcclain. Support services team met with the patient during [facility].   Beverly Mcclain was given information about support services today including CSW contact information, information about support team members and programs.   SDOH (Social Determinants of Health) assessments performed: Yes SDOH Interventions     Most Recent Value  SDOH Interventions  SDOH Interventions for the Following Domains Financial Strain  Financial Strain Interventions Development worker, community, Other (Comment)  [breast cancer foundation information,  FMLA information]        Family/Social Information:  . Housing Arrangement: patient lives with husband in a single family home that they rent . Relationship status: married       Family members/support persons in your life? Husband, 2 kids Beverly Mcclain, Beverly Mcclain) and grandkids (4 with 1 more due in September) . Transportation to appointments provided by self, husband . Financial concerns: Yes  o Are you able to meet your monthly expenses or do you feel financially stressed? Sometimes hard to meet needs o Are you concerned about future financial problems due to your illness or keeping your job and income through treatment? Yes . Employment: working doing Education administrator (self-employed)  Income source: Employment of patient and husband ([employer]) . Religious or spiritual practice: yes, relies on faith and God to cope . Medication Concerns: no (if patient is experiencing medication concerns, please refer to pharmacy) . Services Currently in place:  Anxiety medications  . Concerns about diagnosis and/or treatment: nervous about treatment and about affording daily bills when she has decreased Mcclain . Patient reported stressors: Mcclain/ school, Adjusting to my illness and finances . Patient enjoys faith, music, grandchildren  Current coping skills/ strengths: Capable of  independent living Motivation for treatment/growth Religious Affiliation Supportive family/friends     SUMMARY: Current SDOH Barriers:  . Financial constraints related to daily needs if any reduced Mcclain . Lacks knowledge of community resource: financial assistance, benefit programs  Clinical Social Mcclain Clinical Goal(s):  Marland Kitchen Over the next 30 days, patient will Mcclain with [place] and financial navigator to address needs related to financial concerns  Interventions: . Patient interviewed and SDOH assessment performed . Provided patient with information about breast cancer foundations   Follow Up Plan: SW will follow up with patient by phone over the next 10 days Patient verbalizes understanding of plan: Yes  Beverly Mcclain Beverly Mohs LCSW

## 2019-12-25 ENCOUNTER — Telehealth: Payer: Self-pay | Admitting: Hematology and Oncology

## 2019-12-25 NOTE — Telephone Encounter (Signed)
No 8/4 los, no changes made to pt schedule  

## 2019-12-30 ENCOUNTER — Encounter: Payer: Self-pay | Admitting: *Deleted

## 2020-01-01 ENCOUNTER — Other Ambulatory Visit: Payer: Self-pay

## 2020-01-01 ENCOUNTER — Encounter: Payer: Self-pay | Admitting: *Deleted

## 2020-01-01 ENCOUNTER — Telehealth: Payer: Self-pay | Admitting: *Deleted

## 2020-01-01 ENCOUNTER — Encounter: Payer: Self-pay | Admitting: Licensed Clinical Social Worker

## 2020-01-01 ENCOUNTER — Ambulatory Visit (HOSPITAL_COMMUNITY)
Admission: RE | Admit: 2020-01-01 | Discharge: 2020-01-01 | Disposition: A | Discharge status: Home / Self Care | Payer: 59 | Source: Ambulatory Visit | Attending: Surgery | Admitting: Surgery

## 2020-01-01 DIAGNOSIS — Z170 Estrogen receptor positive status [ER+]: Secondary | ICD-10-CM | POA: Diagnosis present

## 2020-01-01 DIAGNOSIS — C50412 Malignant neoplasm of upper-outer quadrant of left female breast: Secondary | ICD-10-CM | POA: Diagnosis present

## 2020-01-01 IMAGING — MR MR BREAST BILAT WO/W CM
7 of 10 series · 27 of 48 positions shown · IV contrast (gadavist)
Comparison: Previous mammography

CLINICAL DATA: The patient had 2 recent biopsies in the left breast
demonstrating malignancy. Two probably benign masses were seen on
the left as well. An MRI was recommended to assess for extent of
disease and due to the patient's difficult breast pattern.

LABS:  None
EXAM:
BILATERAL BREAST MRI WITH AND WITHOUT CONTRAST
TECHNIQUE: Multiplanar, multisequence MR images of both breasts were obtained
prior to and following the intravenous administration of 7 ml of
Gadavist

[Series 3: T2 · axial · 3.0mm · 0.89mm/px · z∈[-79,+83]mm · 2 of 55 slices shown]
[im 1/55]
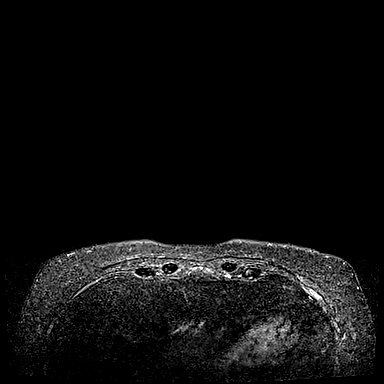
[im 55/55]
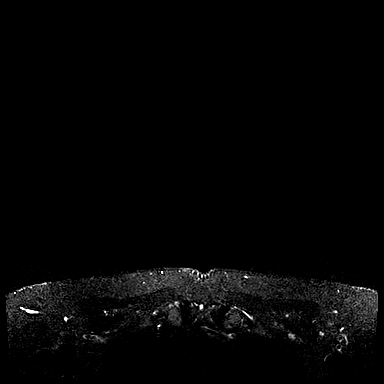

[Series 4: T1 fat-sat · axial · 1.2mm · 0.76mm/px · z∈[-74,+79]mm · 5 of 128 slices shown (1 of 4)]
[im 1/128]
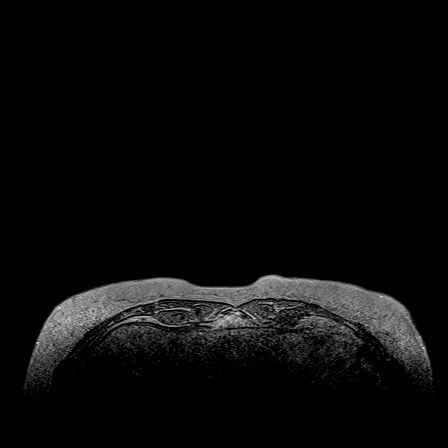
[im 32/128]
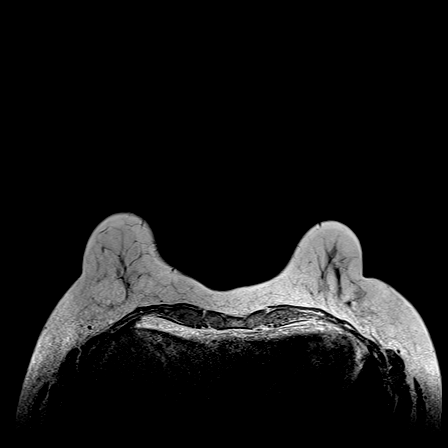
[im 64/128]
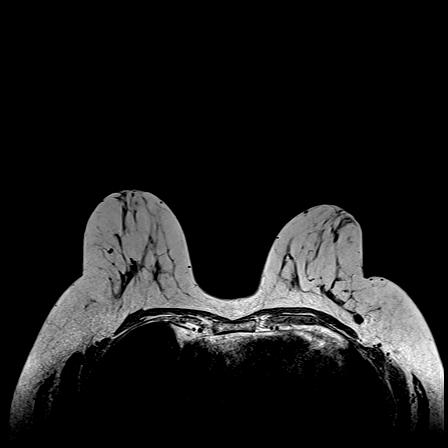
[im 96/128]
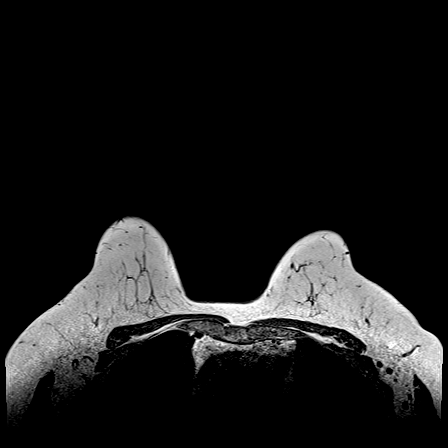
[im 128/128]
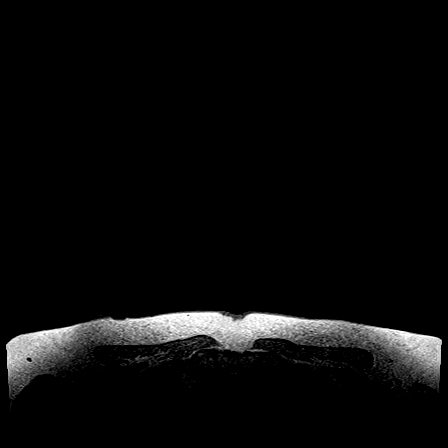

[Series 6: T1 fat-sat · axial · 1.6mm · 0.87mm/px · z∈[-86,+91]mm · 5 of 112 slices shown (2 of 4)]
[im 1/112]
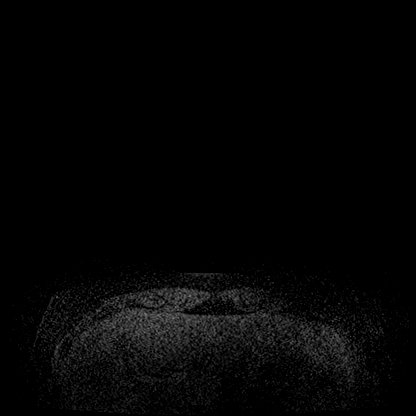
[im 28/112]
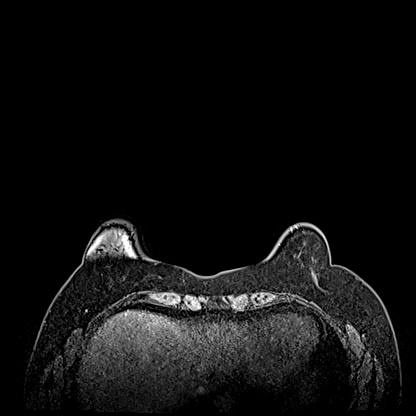
[im 56/112]
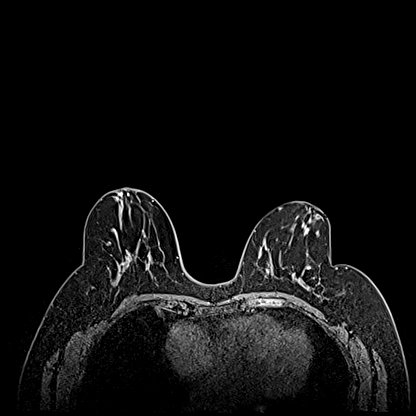
[im 84/112]
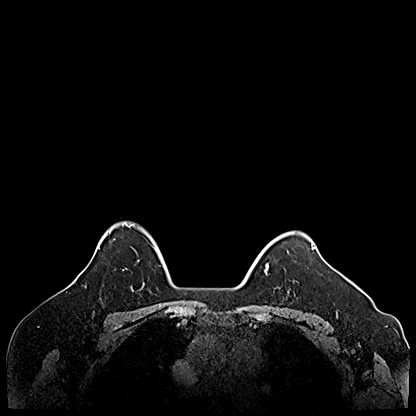
[im 112/112]
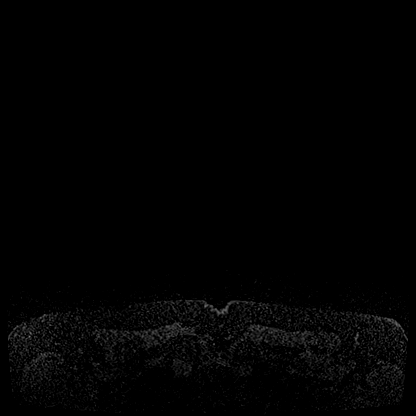

[Series 7: T1 fat-sat · axial · 1.6mm · 0.87mm/px · z∈[-86,+91]mm · 5 of 112 slices shown (3 of 4)]
[im 1/112]
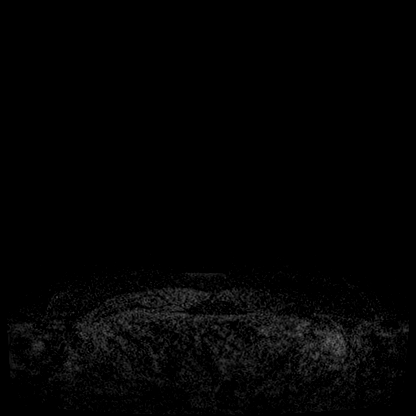
[im 28/112]
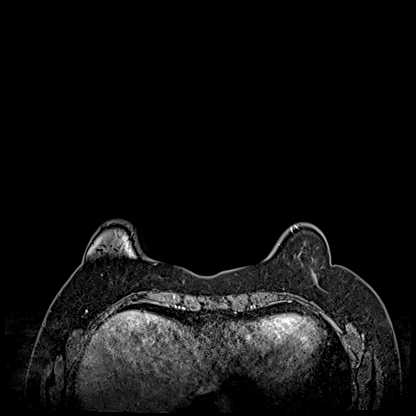
[im 56/112]
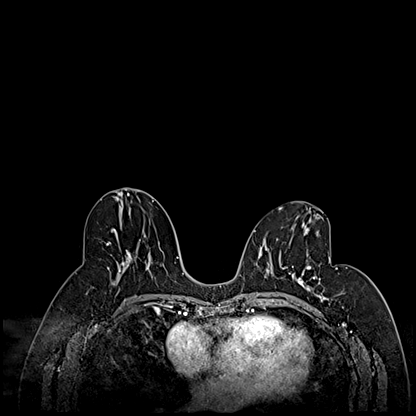
[im 84/112]
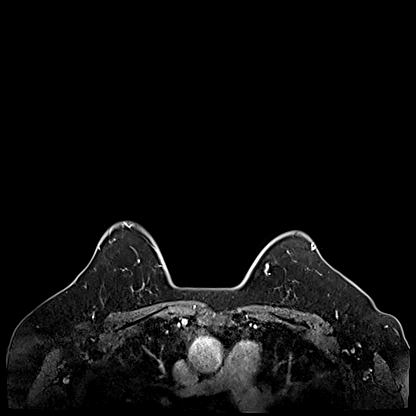
[im 112/112]
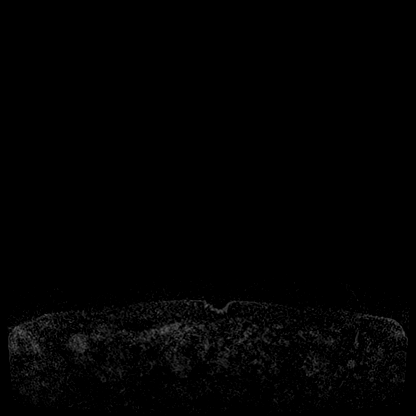

[Series 8: T1 · axial · 1.6mm · 0.87mm/px · z∈[-86,+91]mm · 6 of 112 slices shown (1 of 2)]
[im 1/112]
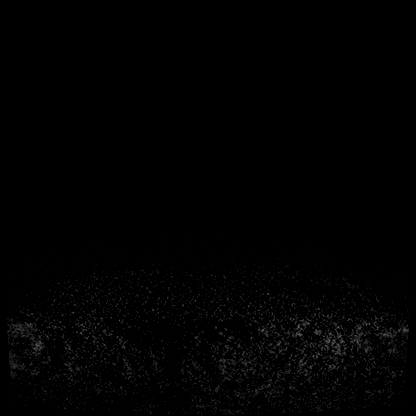
[im 23/112]
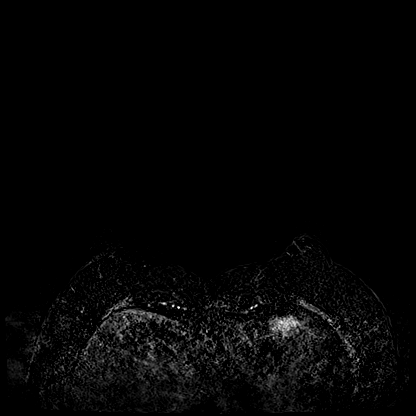
[im 45/112]
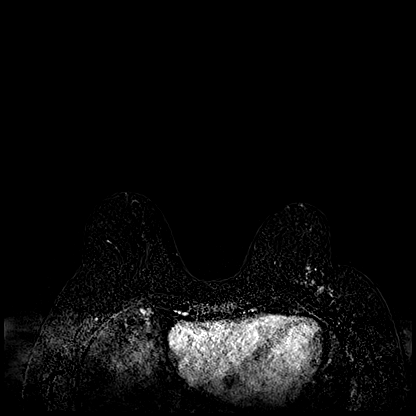
[im 67/112]
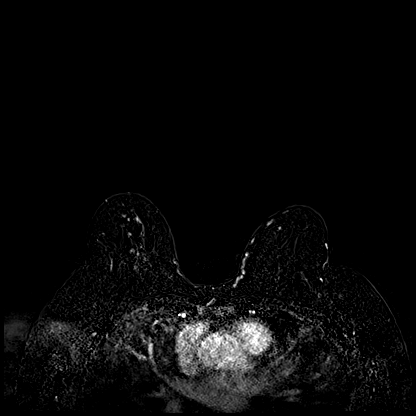
[im 89/112]
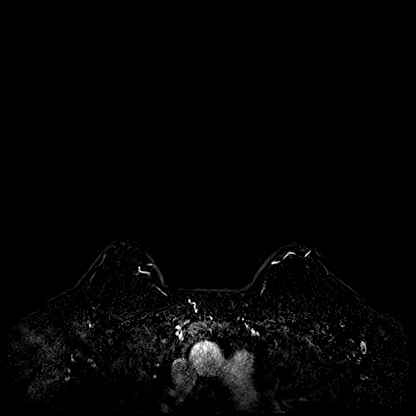
[im 112/112]
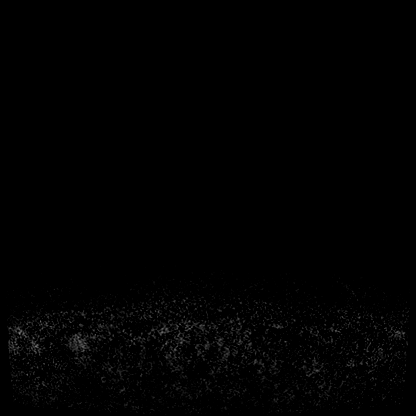

[Series 10: T1 · axial · 179.2mm · 0.87mm/px · 1 of 3 slices shown (2 of 2)]
[im 1/3]
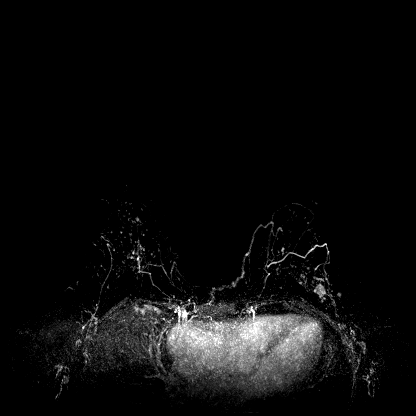

[Series 11: T1 fat-sat · axial · 1.6mm · 0.87mm/px · z∈[-86,-16]mm · 3 of 112 slices shown (4 of 4)]
[im 1/112]
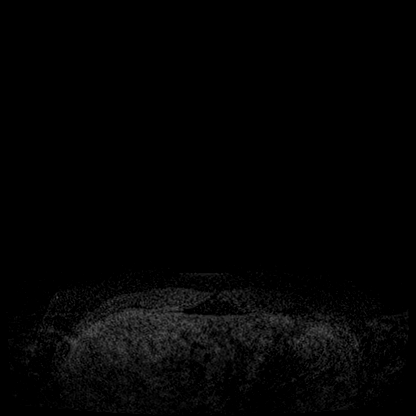
[im 23/112]
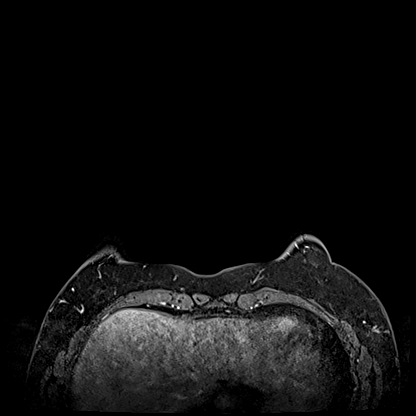
[im 45/112]
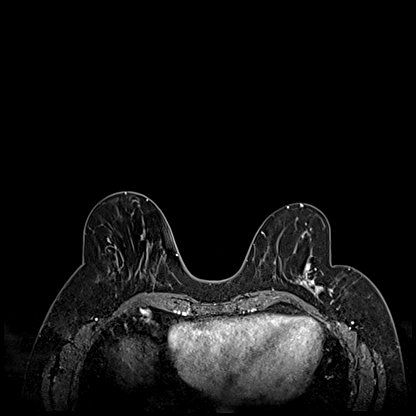

[27 of 48 positions shown; findings below may reference images not displayed]

Three-dimensional MR images were rendered by post-processing of the
original MR data on an independent workstation. The
three-dimensional MR images were interpreted, and findings are
reported in the following complete MRI report for this study. Three
dimensional images were evaluated at the independent [employer]
workstation
FINDINGS: Breast composition: c. Heterogeneous fibroglandular tissue.

Background parenchymal enhancement: Mild

Right breast: There is a mass at 12 o'clock in the anterior right
breast on series 8, image 47 measuring 4.5 mm with persistent
enhancement kinetics. There is non mass enhancement in the right
breast at 12 o'clock at a mid to posterior depth spanning 3.7 cm on
series 14, image 43 with persistent enhancement kinetics. No other
abnormalities are seen on the right.

Left breast: The larger of the 2 known malignancies in the left
lower outer breast measures 12 mm as seen on series 8, image 64. The
smaller more posteriorly located malignancy is seen on series 8,
image 58 with minimal enhancement associated with the biopsy clip.
The enhancement measures 6 mm. Just lateral to the larger of the 2
known malignancies is a small enhancing mass which could represent a
satellite lesion or an intramammary lymph node. This small mass is 8
mm lateral to the known larger malignancy. There is a small region
of clumped non mass enhancement in the superior slightly medial left
breast best seen on series 14, image 43 measuring 8 mm.

Lymph nodes: No abnormal appearing lymph nodes.

Ancillary findings:  None.
IMPRESSION: 1. There is a 4.5 mm mass in the anterior right breast at 12 o'clock
on series 8, image 47 which is indeterminate.
2. There is clumped non mass enhancement spanning 3.7 cm at 12
o'clock in the right breast at a mid to posterior depth on 14, image
43, image which is indeterminate.
3. The larger of the 2 known malignancies in the lower outer left
breast measures 12 mm.
4. The smaller more posteriorly located malignancy is seen on series
8, image 58 with 6 mm of enhancement adjacent to a biopsy clip.
5. 8 mm lateral to the larger of the known 2 malignancies is a small
mass which could be an intramammary lymph node or a small satellite
lesion.
6. 8 mm of clumped non mass enhancement in the superior slightly
medial left breast on series 14, image 43.

RECOMMENDATION:
1. Recommend ultrasound-guided biopsy of the 11 o'clock left breast
mass seen at diagnostic mammography.
2. Recommend ultrasound-guided biopsy of the left breast 3 o'clock
mass seen at diagnostic mammography.
3. Recommend MRI guided biopsy of the 4.5 mm mass at 12 o'clock in
the anterior right breast on series 8, image 47.
4. Recommend biopsy of the non mass enhancement at 12 o'clock in the
right breast at a mid to posterior depth on series 14, image 43.
5. Recommend biopsy of the clumped non mass enhancement in the
superior slightly medial left breast on series 14, image 43.
6. The small mass 8 mm lateral to the larger of the 2 known
malignancies could be an intramammary lymph node or small satellite
lesion. This should be removed at surgery.

BI-RADS CATEGORY  4: Suspicious.

## 2020-01-01 MED ORDER — GADOBUTROL 1 MMOL/ML IV SOLN
7.0000 mL | Freq: Once | INTRAVENOUS | Status: AC | PRN
  Administered 2020-01-01: 7 mL via INTRAVENOUS

## 2020-01-01 NOTE — Progress Notes (Signed)
[place] CSW Progress Note  Clinical Education officer, museum contacted patient by phone to follow up after [facility]. Patient is already stressed by bills coming in from scans and visits and has more scans coming up this & next week. She received the information on the breast cancer foundations after last visit and has started working on them.  CSW will continue to assist in completing the diagnosis letters.  Patient inquired about any assistance through [employer]. Message sent to L. White, Hotel manager for assistance.    Edwinna Areola Jailyn Langhorst , LCSW

## 2020-01-01 NOTE — Telephone Encounter (Signed)
Spoke with patient to follow up from [facility] last week and assess navigation needs.  Confirmed appointments for her scans and MRI.  Encouraged her to call with any needs or concerns.

## 2020-01-04 ENCOUNTER — Encounter: Payer: Self-pay | Admitting: Hematology and Oncology

## 2020-01-05 ENCOUNTER — Ambulatory Visit (HOSPITAL_COMMUNITY)
Admission: RE | Admit: 2020-01-05 | Discharge: 2020-01-05 | Disposition: A | Discharge status: Home / Self Care | Payer: 59 | Source: Ambulatory Visit | Attending: Hematology and Oncology | Admitting: Hematology and Oncology

## 2020-01-05 ENCOUNTER — Other Ambulatory Visit: Payer: Self-pay

## 2020-01-05 ENCOUNTER — Encounter: Payer: Self-pay | Admitting: *Deleted

## 2020-01-05 ENCOUNTER — Encounter (HOSPITAL_COMMUNITY)
Admission: RE | Admit: 2020-01-05 | Discharge: 2020-01-05 | Disposition: A | Discharge status: Home / Self Care | Payer: 59 | Source: Ambulatory Visit | Attending: Hematology and Oncology | Admitting: Hematology and Oncology

## 2020-01-05 DIAGNOSIS — C50412 Malignant neoplasm of upper-outer quadrant of left female breast: Secondary | ICD-10-CM

## 2020-01-05 DIAGNOSIS — Z170 Estrogen receptor positive status [ER+]: Secondary | ICD-10-CM

## 2020-01-05 IMAGING — CT CT ABD-PELV W/ CM
2 of 5 series · 12 of 36 positions shown, 15 images · IV contrast (APPLIED)
Comparison: None.

CLINICAL DATA: Newly diagnosed left breast carcinoma.  Staging.

EXAM:
CT CHEST, ABDOMEN, AND PELVIS WITH CONTRAST
TECHNIQUE: Multidetector CT imaging of the chest, abdomen and pelvis was
performed following the standard protocol during bolus
administration of intravenous contrast.
CONTRAST:  100mL OMNIPAQUE IOHEXOL 300 MG/ML  SOLN

[Series 2: cap with · axial · 0.82mm/px · z∈[+1087,+1567]mm · 9 of 121 slices shown, 12 images]
[im 13/121  mediastinal]
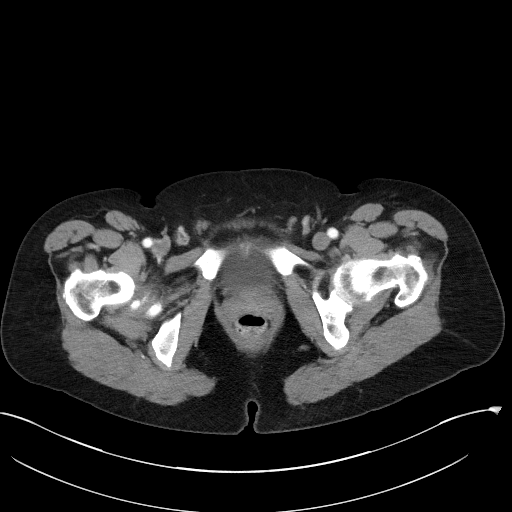
[im 13/121  lung]
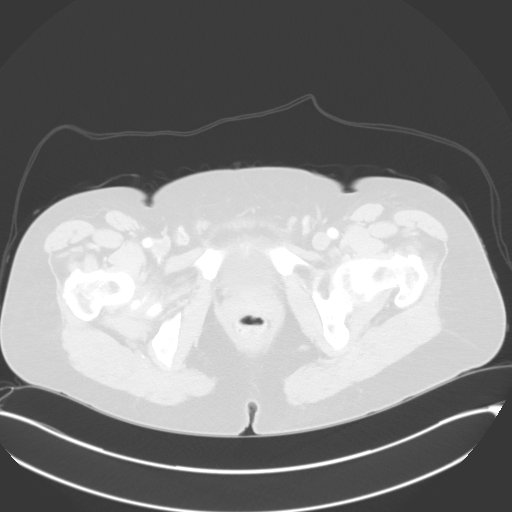
[im 25/121  lung]
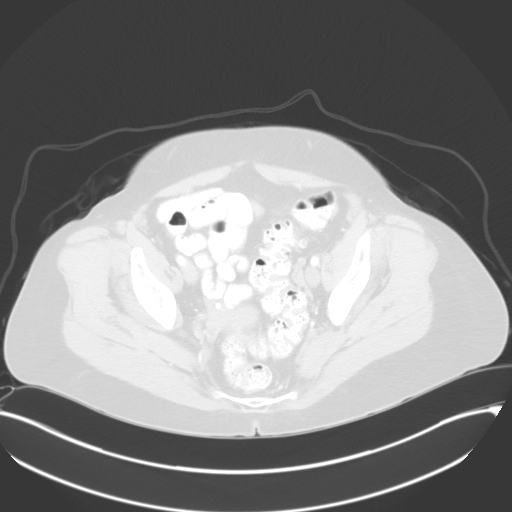
[im 37/121  lung]
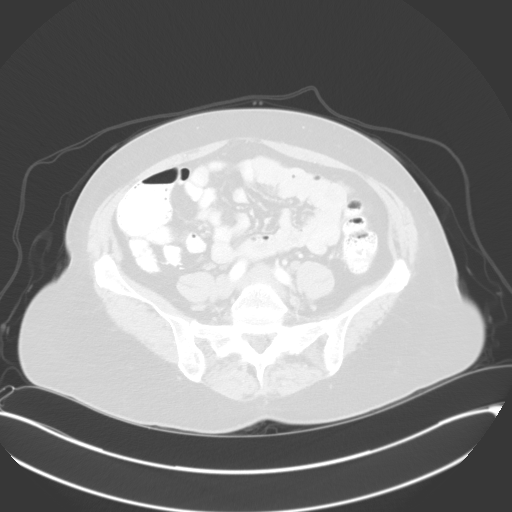
[im 49/121  lung]
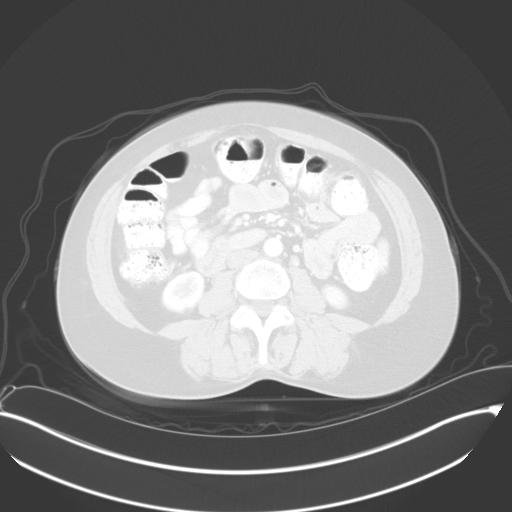
[im 61/121  mediastinal]
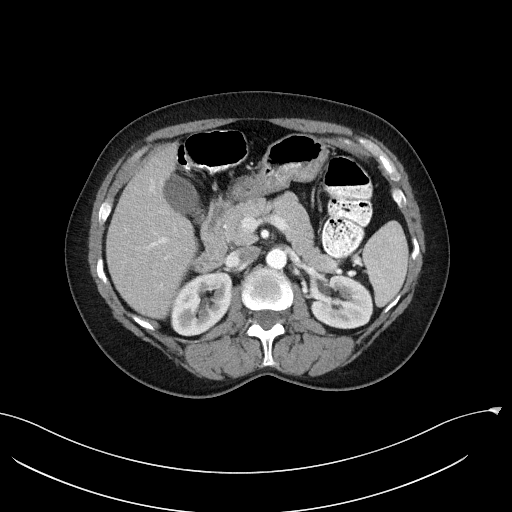
[im 61/121  lung]
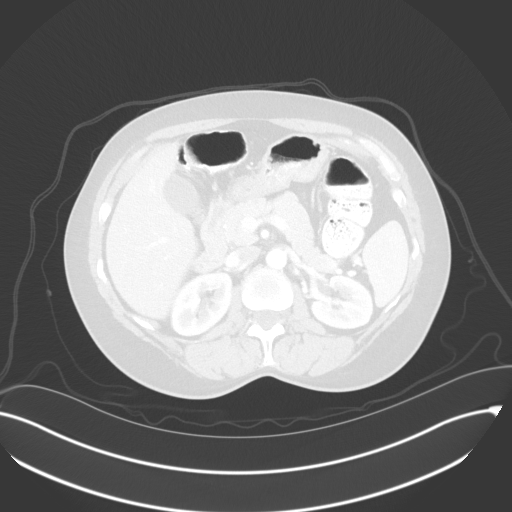
[im 73/121  lung]
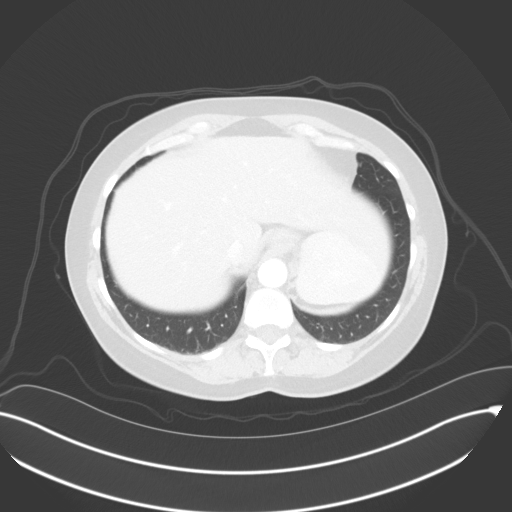
[im 85/121  lung]
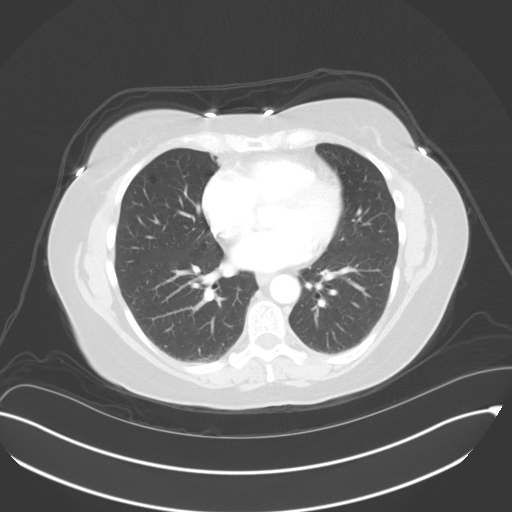
[im 97/121  lung]
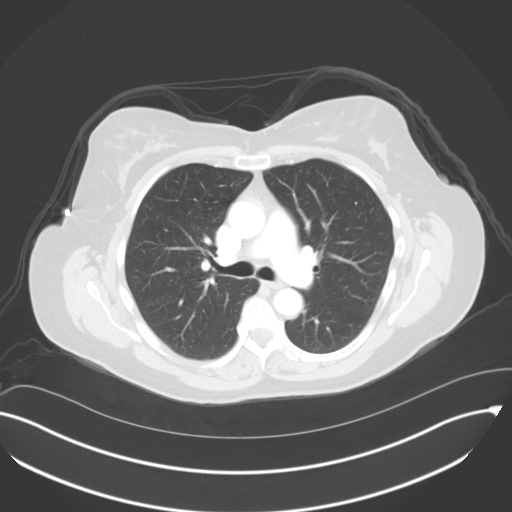
[im 109/121  mediastinal]
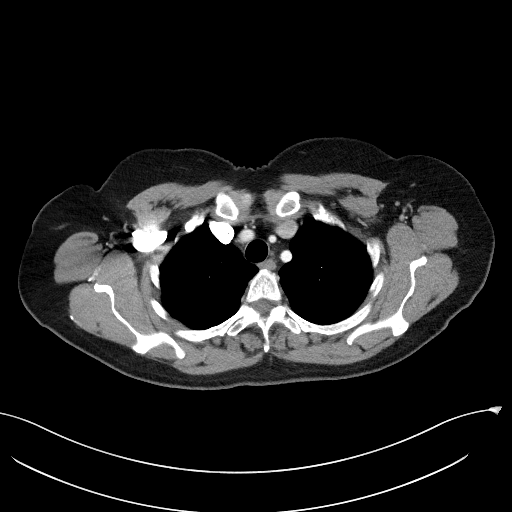
[im 109/121  lung]
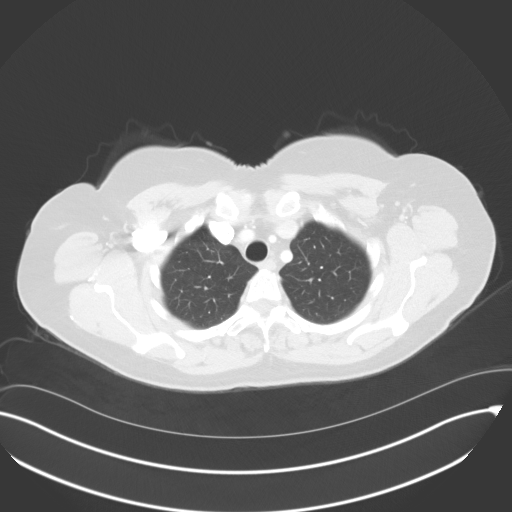

[Series 5: coronals · coronal · 0.71mm/px · 3 of 142 slices shown]
[im 29/142  lung]
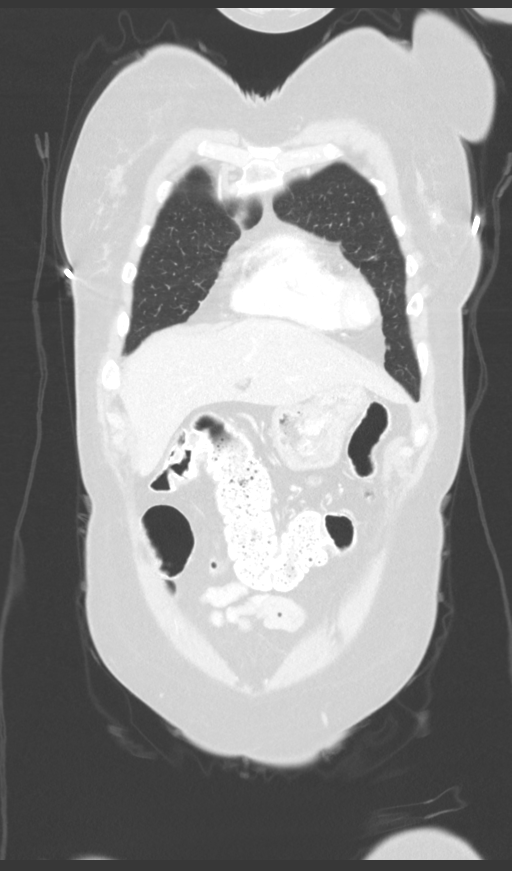
[im 57/142  lung]
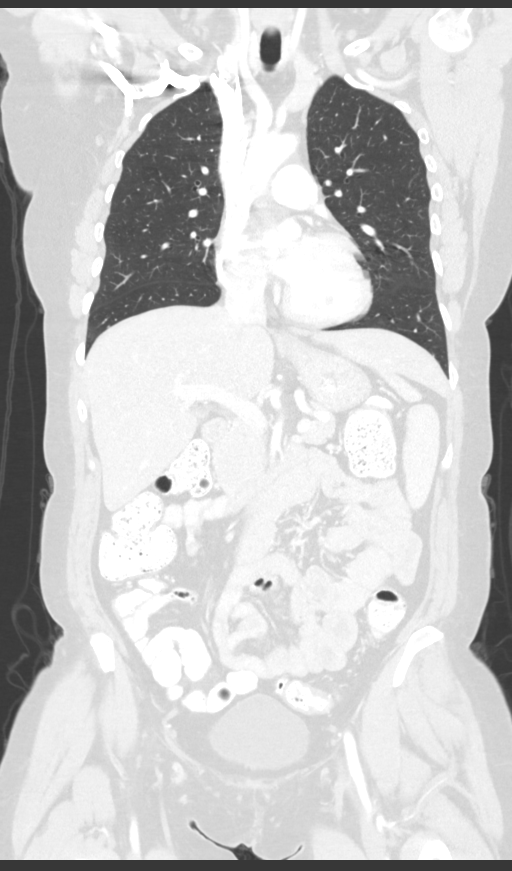
[im 85/142  lung]
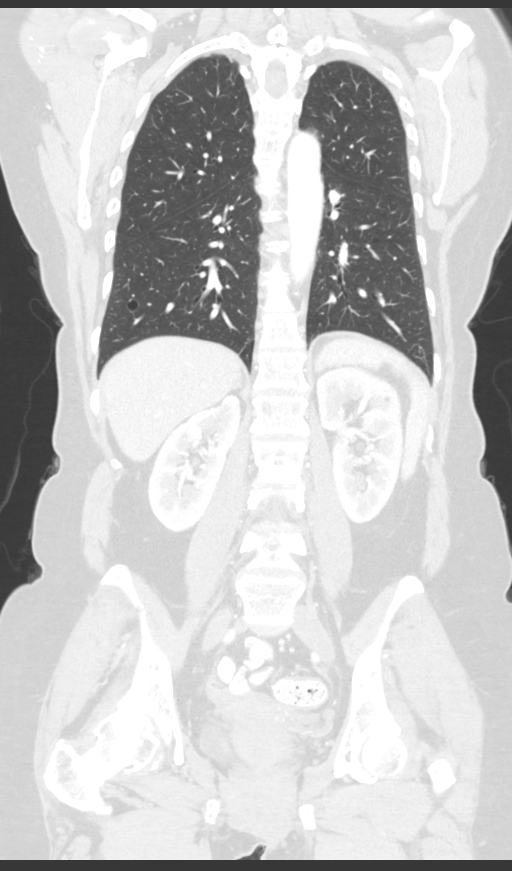

[12 of 36 positions shown; findings below may reference images not displayed]

FINDINGS: CT CHEST FINDINGS

Cardiovascular: No acute findings.

Mediastinum/Lymph Nodes: No masses or pathologically enlarged lymph
nodes identified.

Lungs/Pleura: No pulmonary infiltrate or mass identified. No
effusion present.

Musculoskeletal:  No suspicious bone lesions identified.

CT ABDOMEN AND PELVIS FINDINGS

Hepatobiliary: No masses identified. Gallbladder is unremarkable. No
evidence of biliary ductal dilatation.

Pancreas:  No mass or inflammatory changes.

Spleen:  Within normal limits in size and appearance.

Adrenals/Urinary tract: Both adrenal glands are normal in
appearance. A 1.3 cm homogeneous fat attenuation mass is seen in the
lower pole the right kidney, consistent with a benign
angiomyolipoma. Three other sub-cm low-attenuation lesions are seen
in the upper pole of the right kidney, and upper and lower poles of
the left kidney. These are too small to characterize, but likely
represent additional tiny benign angiomyolipomas. No evidence of
ureteral calculi or hydronephrosis.

Stomach/Bowel: No evidence of obstruction, inflammatory process, or
abnormal fluid collections.

Vascular/Lymphatic: No pathologically enlarged lymph nodes
identified. No abdominal aortic aneurysm.

Reproductive:  No mass or other significant abnormality identified.

Other:  None.

Musculoskeletal:  No suspicious bone lesions identified.
IMPRESSION: No evidence of metastatic disease within the chest, abdomen, or
pelvis.

Small benign renal angiomyolipomas incidentally noted.

## 2020-01-05 IMAGING — CT CT CHEST W/ CM
2 of 5 series · 12 of 36 positions shown, 15 images · IV contrast (APPLIED)
Comparison: None.

CLINICAL DATA: Newly diagnosed left breast carcinoma.  Staging.

EXAM:
CT CHEST, ABDOMEN, AND PELVIS WITH CONTRAST
TECHNIQUE: Multidetector CT imaging of the chest, abdomen and pelvis was
performed following the standard protocol during bolus
administration of intravenous contrast.
CONTRAST:  100mL OMNIPAQUE IOHEXOL 300 MG/ML  SOLN

[Series 2: cap with · axial · 0.82mm/px · z∈[+1087,+1567]mm · 9 of 121 slices shown, 12 images]
[im 13/121  mediastinal]
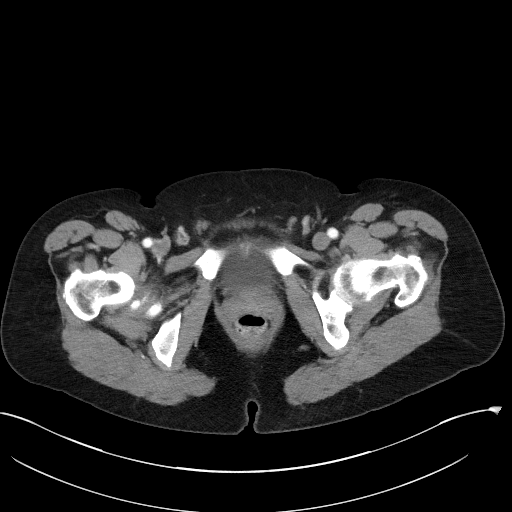
[im 13/121  lung]
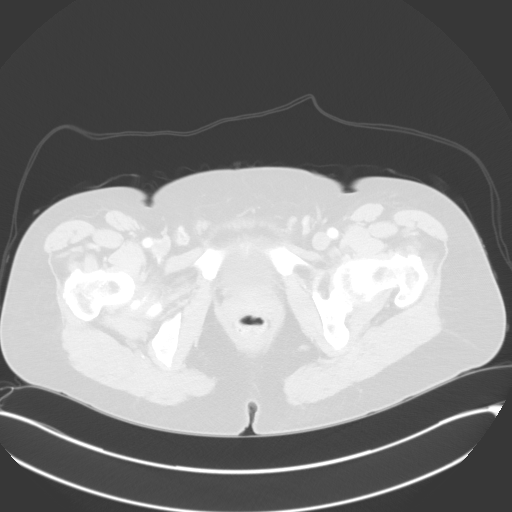
[im 25/121  lung]
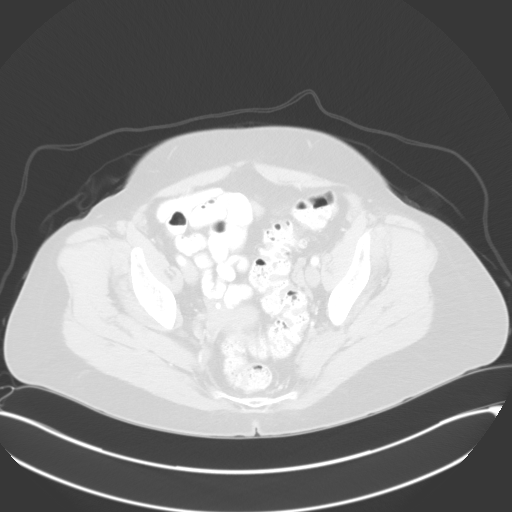
[im 37/121  lung]
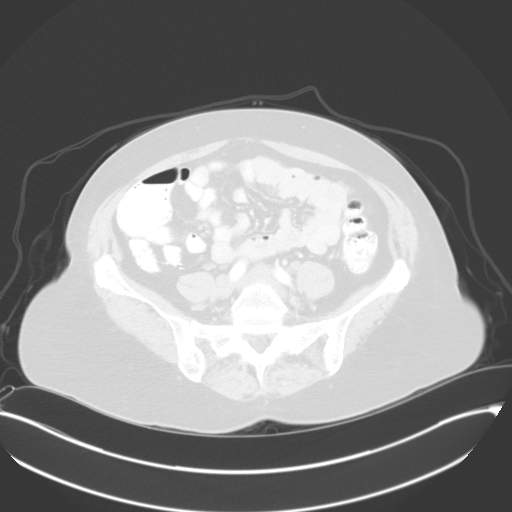
[im 49/121  lung]
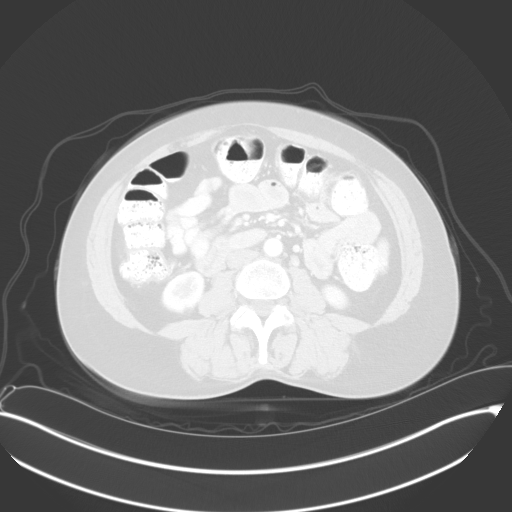
[im 61/121  mediastinal]
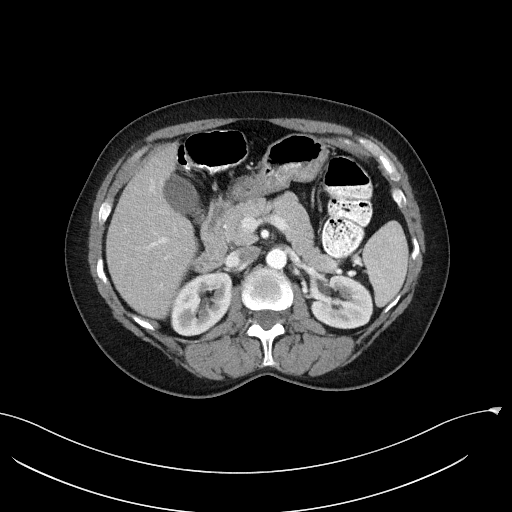
[im 61/121  lung]
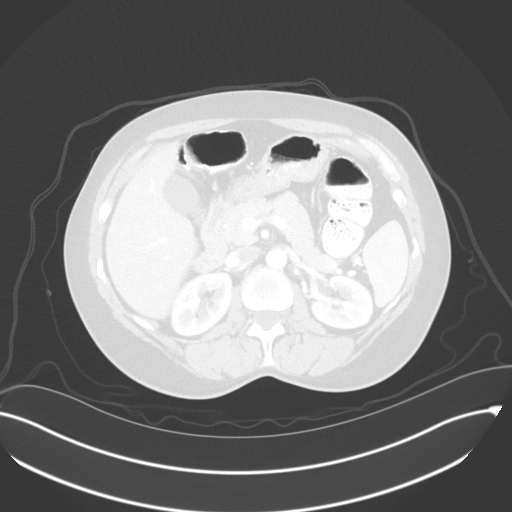
[im 73/121  lung]
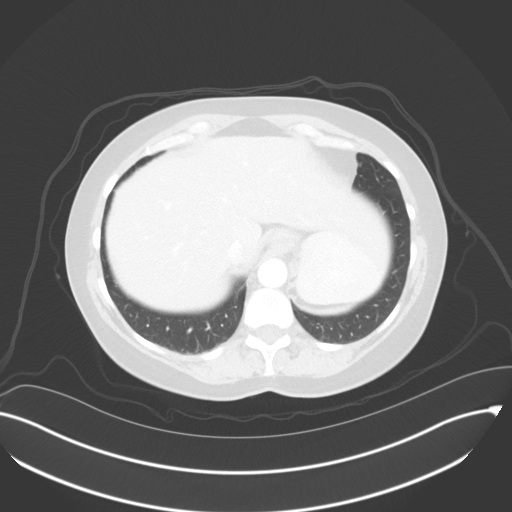
[im 85/121  lung]
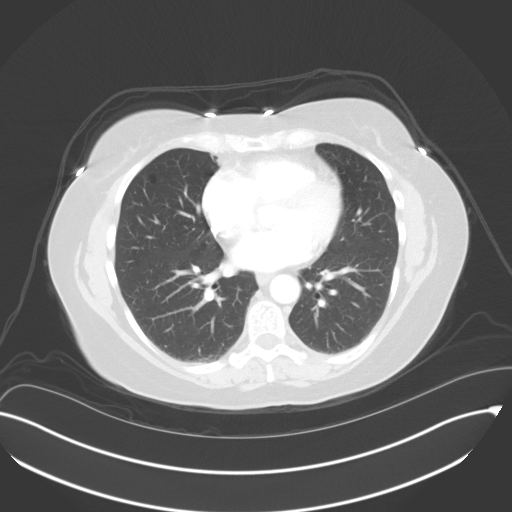
[im 97/121  lung]
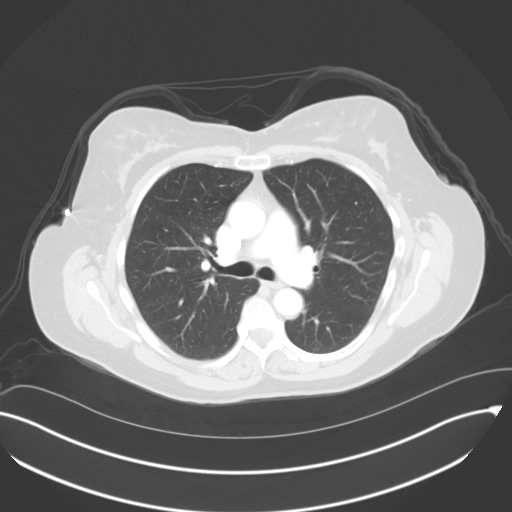
[im 109/121  mediastinal]
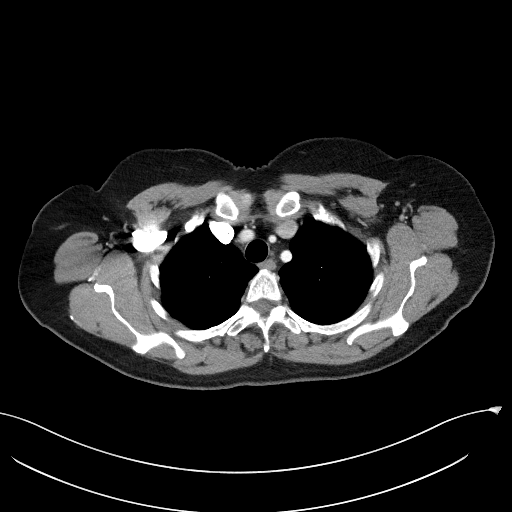
[im 109/121  lung]
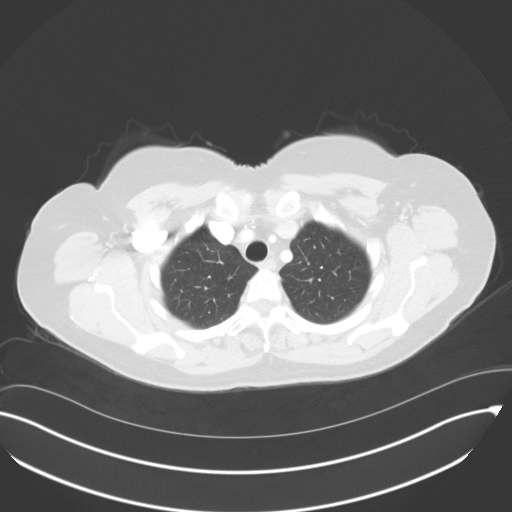

[Series 5: coronals · coronal · 0.71mm/px · 3 of 142 slices shown]
[im 29/142  lung]
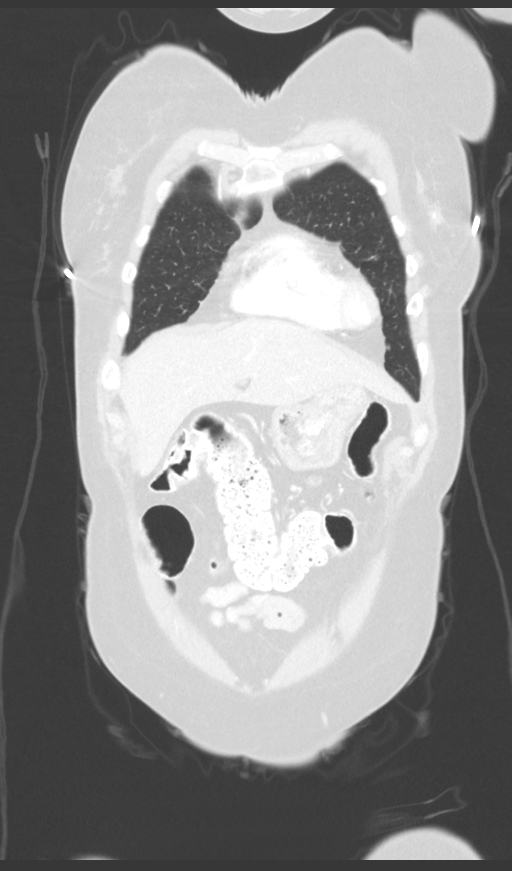
[im 57/142  lung]
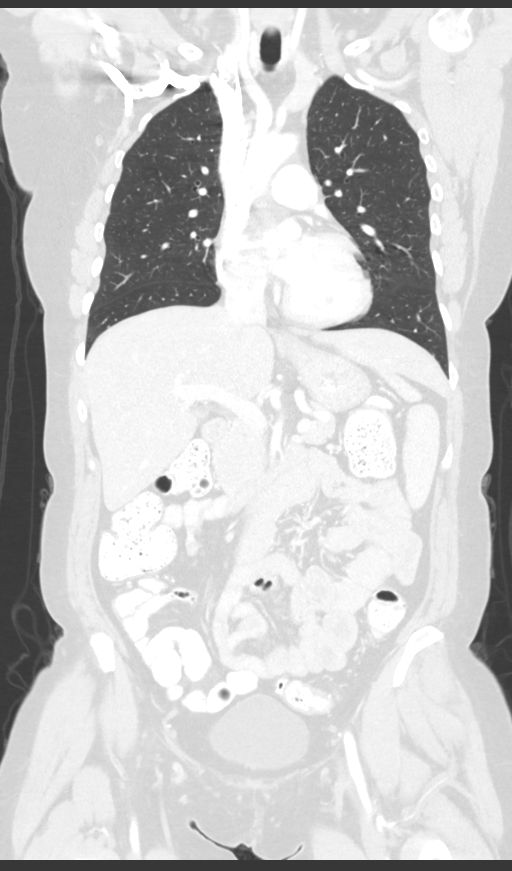
[im 85/142  lung]
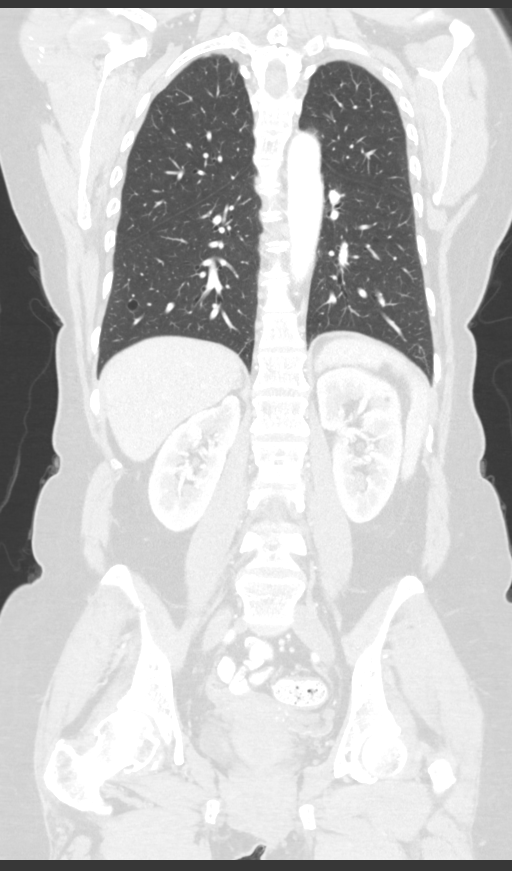

[12 of 36 positions shown; findings below may reference images not displayed]

FINDINGS: CT CHEST FINDINGS

Cardiovascular: No acute findings.

Mediastinum/Lymph Nodes: No masses or pathologically enlarged lymph
nodes identified.

Lungs/Pleura: No pulmonary infiltrate or mass identified. No
effusion present.

Musculoskeletal:  No suspicious bone lesions identified.

CT ABDOMEN AND PELVIS FINDINGS

Hepatobiliary: No masses identified. Gallbladder is unremarkable. No
evidence of biliary ductal dilatation.

Pancreas:  No mass or inflammatory changes.

Spleen:  Within normal limits in size and appearance.

Adrenals/Urinary tract: Both adrenal glands are normal in
appearance. A 1.3 cm homogeneous fat attenuation mass is seen in the
lower pole the right kidney, consistent with a benign
angiomyolipoma. Three other sub-cm low-attenuation lesions are seen
in the upper pole of the right kidney, and upper and lower poles of
the left kidney. These are too small to characterize, but likely
represent additional tiny benign angiomyolipomas. No evidence of
ureteral calculi or hydronephrosis.

Stomach/Bowel: No evidence of obstruction, inflammatory process, or
abnormal fluid collections.

Vascular/Lymphatic: No pathologically enlarged lymph nodes
identified. No abdominal aortic aneurysm.

Reproductive:  No mass or other significant abnormality identified.

Other:  None.

Musculoskeletal:  No suspicious bone lesions identified.
IMPRESSION: No evidence of metastatic disease within the chest, abdomen, or
pelvis.

Small benign renal angiomyolipomas incidentally noted.

## 2020-01-05 IMAGING — NM NM BONE WHOLE BODY
2 series · 2 of 2 positions shown · non-contrast
Comparison: None

Correlation: CT chest abdomen pelvis [DATE]

CLINICAL DATA: Newly diagnosed LEFT breast cancer, staging

EXAM:
NUCLEAR MEDICINE WHOLE BODY BONE SCAN
TECHNIQUE: Whole body anterior and posterior images were obtained approximately
3 hours after intravenous injection of radiopharmaceutical.
RADIOPHARMACEUTICALS:  22 mCi [YS] MDP IV

[Series 1: wbr_bone_40 whole body · 2.66mm/px · 1 of 1 slices shown (1 of 2)]
[im 1/1]
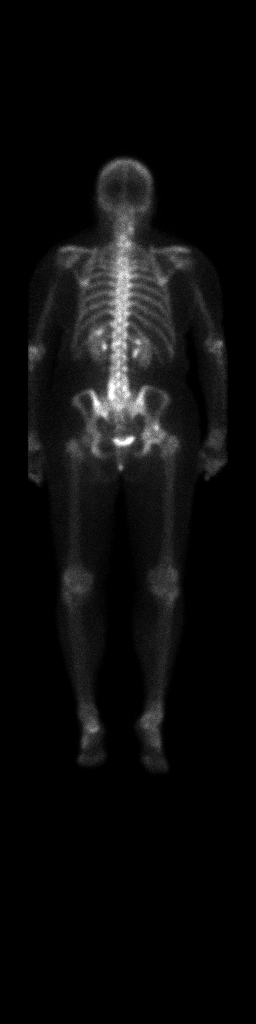

[Series 1: wbr_bone_40 whole body · 2.66mm/px · 1 of 1 slices shown (2 of 2)]
[im 1/1]
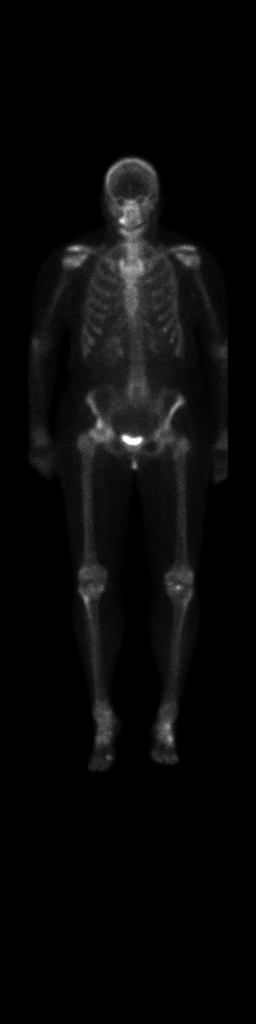

[2 of 2 positions shown; findings below may reference images not displayed]

FINDINGS: Uptake at cervical or spine, shoulders, sternoclavicular joints,
hips, knees, feet, typically degenerative.

More focal uptake at RIGHT acetabular roof corresponds to cystic
degenerative changes on CT.

Uptake LEFT maxilla, likely related to dental disease.

No worrisome sites of abnormal tracer uptake to suggest osseous
metastatic disease.

Expected urinary tract and soft tissue distribution of tracer.
IMPRESSION: No scintigraphic evidence of osseous metastatic disease.

## 2020-01-05 MED ORDER — IOHEXOL 300 MG/ML  SOLN
100.0000 mL | Freq: Once | INTRAMUSCULAR | Status: AC | PRN
  Administered 2020-01-05: 100 mL via INTRAVENOUS

## 2020-01-05 MED ORDER — FLUDEOXYGLUCOSE F - 18 (FDG) INJECTION: 22.0000 | Freq: Once | INTRAVENOUS | Status: DC | PRN

## 2020-01-05 MED ORDER — SODIUM CHLORIDE (PF) 0.9 % IJ SOLN
INTRAMUSCULAR | Status: AC
  Filled 2020-01-05: qty 50

## 2020-01-06 ENCOUNTER — Other Ambulatory Visit: Payer: Self-pay | Admitting: Surgery

## 2020-01-07 ENCOUNTER — Other Ambulatory Visit: Payer: Self-pay | Admitting: Surgery

## 2020-01-07 ENCOUNTER — Telehealth: Payer: Self-pay | Admitting: *Deleted

## 2020-01-07 ENCOUNTER — Inpatient Hospital Stay (HOSPITAL_BASED_OUTPATIENT_CLINIC_OR_DEPARTMENT_OTHER): Payer: 59 | Admitting: Hematology and Oncology

## 2020-01-07 ENCOUNTER — Other Ambulatory Visit: Payer: Self-pay

## 2020-01-07 ENCOUNTER — Encounter: Payer: Self-pay | Admitting: *Deleted

## 2020-01-07 DIAGNOSIS — C50412 Malignant neoplasm of upper-outer quadrant of left female breast: Secondary | ICD-10-CM | POA: Diagnosis not present

## 2020-01-07 DIAGNOSIS — R928 Other abnormal and inconclusive findings on diagnostic imaging of breast: Secondary | ICD-10-CM

## 2020-01-07 DIAGNOSIS — Z170 Estrogen receptor positive status [ER+]: Secondary | ICD-10-CM

## 2020-01-07 NOTE — Assessment & Plan Note (Signed)
12/19/2019: Screening mammogram showed a possible mass and 2 asymmetries in the left breast. Diagnostic mammogram showed two masses 1.4cm apart in the left breast at the 2:30 position, 0.7cm and 1.1cm,  Biopsy showed IDC in both masses at the 2:30 position, grade 3, HER-2 equivocal by IHC (2+), negative by FISH, ER+ 80%, PR- 0%, KI67 90%. T1 cN 0 stage Ib clinical stage Oncotype DX: 43, risk of distant recurrence at 9 years: 31%: High risk CT CAP and bone scan: No metastatic disease Breast MRI 01/02/2020: 4.5 cm right breast mass at 12:00, non-mass enhancement 3.7 cm 12:00, 2 previously known malignancies 1.2 cm, 6 mm enhancement, 8 mm NME left breast (recommended biopsies)  Treatment plan: 1. Breast conserving surgery with sentinel lymph node biopsy followed by 2. adjuvant chemotherapy with Taxotere and Cytoxan every 3 weeks x4 cycles 3. Adjuvant radiation therapy followed by 4. Adjuvant antiestrogen therapy -------------------------------------------------------------------------------------------------------------------------- Chemotherapy Counseling: I discussed the risks and benefits of chemotherapy including the risks of nausea/ vomiting, risk of infection from low WBC count, fatigue due to chemo or anemia, bruising or bleeding due to low platelets, mouth sores, loss/ change in taste and decreased appetite and peripheral neuropathy. Liver and kidney function will be monitored through out chemotherapy as abnormalities in liver and kidney function may be a side effect of treatment.  Risk of permanent bone marrow dysfunction and leukemia due to chemo were also discussed.  Return to clinic after surgery is complete to discuss final pathology report and then to determine when chemotherapy can start.

## 2020-01-07 NOTE — Progress Notes (Signed)
Patient Care Team: Maury Dus, MD as PCP - General (Family Medicine) Mauro Kaufmann, RN as Oncology Nurse Navigator Rockwell Germany, RN as Oncology Nurse Navigator Alphonsa Overall, MD as Consulting Physician (General Surgery) Nicholas Lose, MD as Consulting Physician (Hematology and Oncology) Kyung Rudd, MD as Consulting Physician (Radiation Oncology)  DIAGNOSIS:  Encounter Diagnosis  Name Primary?  . Malignant neoplasm of upper-outer quadrant of left breast in female, estrogen receptor positive ([place])     SUMMARY OF ONCOLOGIC HISTORY: Oncology History  Malignant neoplasm of upper-outer quadrant of left breast in female, estrogen receptor positive ([place])  12/19/2019 Initial Diagnosis   Screening mammogram showed a possible mass and 2 asymmetries in the left breast. Diagnostic mammogram showed two masses 1.4cm apart in the left breast at the 2:30 position, 0.7cm and 1.1cm, and two likely cysts, a 0.5cm mass a the 3:00 position, and a 0.9cm mass at the 11:00 position. Biopsy showed IDC in both masses at the 2:30 position, grade 3, HER-2 equivocal by IHC (2+), negative by FISH, ER+ 80%, PR- 0%, KI67 90%.   12/19/2019 Oncotype testing   Oncotype DX score 43: Distant recurrence at 9 years 31%   12/24/2019 Cancer Staging   Staging form: Breast, AJCC 8th Edition - Clinical stage from 12/24/2019: Stage IB (cT1c, cN0, cM0, G3, ER+, PR-, HER2-) - Signed by Nicholas Lose, MD on 12/24/2019     CHIEF COMPLIANT: Follow-up to discuss Oncotype score  INTERVAL HISTORY: Beverly Mcclain is a 59 year old with above-mentioned history of left breast cancer who is currently awaiting scheduling for surgery.  We will perform Oncotype DX because we were highly suspecting that she would need chemotherapy and might require port placement.  Her Oncotype did come back as markedly elevated at a score of 43 and she is here today to discuss the chemotherapy plan.  She had CT chest abdomen pelvis and bone scan which  did not show any metastatic disease.  She had a breast MRI which identified additional abnormalities which will need additional biopsies.   ALLERGIES:  is allergic to ibuprofen, sulfa antibiotics, and tuberculin tests.  MEDICATIONS:  Current Outpatient Medications  Medication Sig Dispense Refill  . ALPRAZolam (XANAX) 0.25 MG tablet     . amphetamine-dextroamphetamine (ADDERALL) 15 MG tablet Take by mouth.    Marland Kitchen ascorbic acid (VITAMIN C) 1000 MG tablet Take by mouth.    . Cholecalciferol 25 MCG (1000 UT) capsule Take 1 tablet by mouth daily.    . Cyanocobalamin (VITAMIN B-12) 5000 MCG SUBL     . HYDROcodone-Acetaminophen 5-300 MG TABS Take by mouth.    . sertraline (ZOLOFT) 100 MG tablet Take 100 mg by mouth at bedtime.     No current facility-administered medications for this visit.   Facility-Administered Medications Ordered in Other Visits  Medication Dose Route Frequency Provider Last Rate Last Admin  . fludeoxyglucose F - 18 (FDG) injection 22 millicurie  22 millicurie Intravenous Once PRN Felipa Emory, MD        PHYSICAL EXAMINATION: ECOG PERFORMANCE STATUS: 1 - Symptomatic but completely ambulatory  There were no vitals filed for this visit. There were no vitals filed for this visit.   LABORATORY DATA:  I have reviewed the data as listed CMP Latest Ref Rng & Units 12/24/2019  Glucose 70 - 99 mg/dL 92  BUN 6 - 20 mg/dL 14  Creatinine 0.44 - 1.00 mg/dL 0.80  Sodium 135 - 145 mmol/L 140  Potassium 3.5 - 5.1 mmol/L 4.1  Chloride 98 - 111 mmol/L 107  CO2 22 - 32 mmol/L 26  Calcium 8.9 - 10.3 mg/dL 10.0  Total Protein 6.5 - 8.1 g/dL 7.1  Total Bilirubin 0.3 - 1.2 mg/dL 0.3  Alkaline Phos 38 - 126 U/L 82  AST 15 - 41 U/L 15  ALT 0 - 44 U/L 12    Lab Results  Component Value Date   WBC 6.1 12/24/2019   HGB 13.4 12/24/2019   HCT 39.7 12/24/2019   MCV 89.2 12/24/2019   PLT 278 12/24/2019   NEUTROABS 3.2 12/24/2019    ASSESSMENT & PLAN:  Malignant neoplasm of  upper-outer quadrant of left breast in female, estrogen receptor positive ([place]) 12/19/2019: Screening mammogram showed a possible mass and 2 asymmetries in the left breast. Diagnostic mammogram showed two masses 1.4cm apart in the left breast at the 2:30 position, 0.7cm and 1.1cm,  Biopsy showed IDC in both masses at the 2:30 position, grade 3, HER-2 equivocal by IHC (2+), negative by FISH, ER+ 80%, PR- 0%, KI67 90%. T1 cN 0 stage Ib clinical stage Oncotype DX: 43, risk of distant recurrence at 9 years: 31%: High risk CT CAP and bone scan: No metastatic disease Breast MRI 01/02/2020: 4.5 cm right breast mass at 12:00, non-mass enhancement 3.7 cm 12:00, 2 previously known malignancies 1.2 cm, 6 mm enhancement, 8 mm NME left breast (recommended biopsies)  Treatment plan: 1. Breast conserving surgery with sentinel lymph node biopsy followed by 2. adjuvant chemotherapy with Taxotere and Cytoxan every 3 weeks x4 cycles 3. Adjuvant radiation therapy followed by 4. Adjuvant antiestrogen therapy -------------------------------------------------------------------------------------------------------------------------- Chemotherapy Counseling: I discussed the risks and benefits of chemotherapy including the risks of nausea/ vomiting, risk of infection from low WBC count, fatigue due to chemo or anemia, bruising or bleeding due to low platelets, mouth sores, loss/ change in taste and decreased appetite and peripheral neuropathy. Liver and kidney function will be monitored through out chemotherapy as abnormalities in liver and kidney function may be a side effect of treatment.  Risk of permanent bone marrow dysfunction and leukemia due to chemo were also discussed.  Patient is still undecided and will inform us of her decision Genetics testing Patient is headed for vacation next week  Return to clinic after surgery is complete to discuss final pathology report and then to determine when chemotherapy can  start.    No orders of the defined types were placed in this encounter.  The patient has a good understanding of the overall plan. she agrees with it. she will call with any problems that may develop before the next visit here. Total time spent: 45 mins including face to face time and time spent for planning, charting and co-ordination of care   Harriette Ohara, MD 01/07/20

## 2020-01-07 NOTE — Telephone Encounter (Signed)
Received oncotype score of 43. Physician team notified. Scheduled and confirmed appt with Dr. Lindi Adie on 01/07/20 at 3:15 to discuss results.

## 2020-01-08 ENCOUNTER — Encounter: Payer: Self-pay | Admitting: *Deleted

## 2020-01-08 ENCOUNTER — Telehealth: Payer: Self-pay | Admitting: Hematology and Oncology

## 2020-01-08 NOTE — Telephone Encounter (Signed)
No 8/18 los, no changes made to pt schedule   

## 2020-01-12 ENCOUNTER — Encounter: Payer: Self-pay | Admitting: *Deleted

## 2020-01-14 ENCOUNTER — Encounter: Payer: Self-pay | Admitting: Hematology and Oncology

## 2020-01-19 ENCOUNTER — Encounter: Payer: Self-pay | Admitting: Hematology and Oncology

## 2020-01-20 ENCOUNTER — Ambulatory Visit
Admission: RE | Admit: 2020-01-20 | Discharge: 2020-01-20 | Disposition: A | Discharge status: Home / Self Care | Payer: 59 | Source: Ambulatory Visit | Attending: Surgery | Admitting: Surgery

## 2020-01-20 ENCOUNTER — Other Ambulatory Visit: Payer: 59

## 2020-01-20 ENCOUNTER — Other Ambulatory Visit: Payer: Self-pay | Admitting: Surgery

## 2020-01-20 ENCOUNTER — Other Ambulatory Visit: Payer: Self-pay

## 2020-01-20 DIAGNOSIS — R928 Other abnormal and inconclusive findings on diagnostic imaging of breast: Secondary | ICD-10-CM

## 2020-01-20 HISTORY — PX: BREAST BIOPSY: SHX20

## 2020-01-20 IMAGING — US US BREAST BX W LOC DEV 1ST LESION IMG BX SPEC US GUIDE*L*
1 series · 12 of 14 positions shown · non-contrast
Comparison: Previous exam(s).
COMPARISON: Previous exam(s).

Addendum:
CLINICAL DATA: 58-year-old female with recently diagnosed left
breast cancer presents for ultrasound-guided biopsy of 2 additional
left breast masses.

EXAM:
ULTRASOUND GUIDED LEFT BREAST CORE NEEDLE BIOPSY

[Series 1: us breast bx w loc dev 1st lesion img bx spec us g · 0.06mm/px · 12 of 14 slices shown]
[im 1/14]
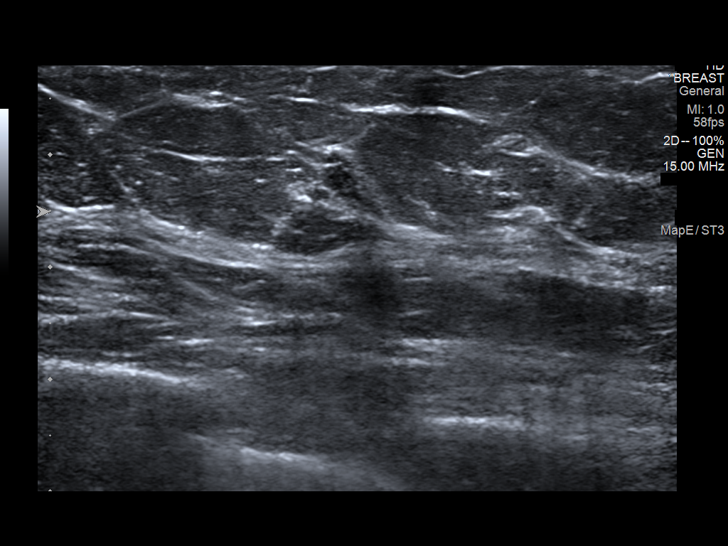
[im 2/14]
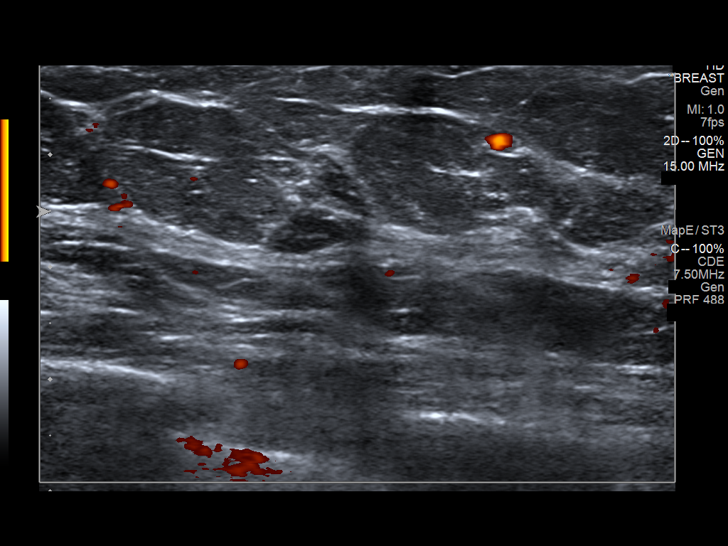
[im 3/14]
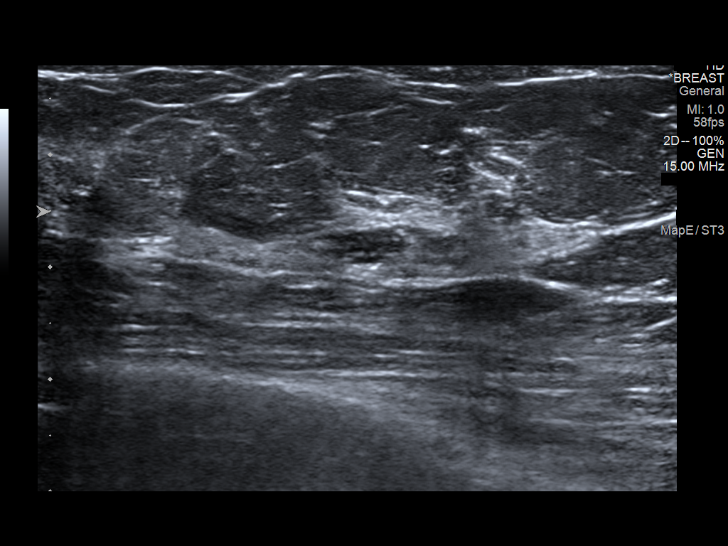
[im 5/14]
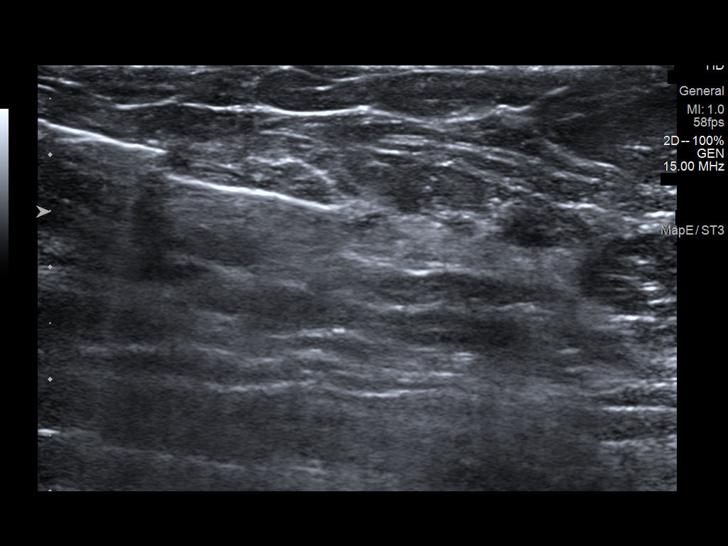
[im 6/14]
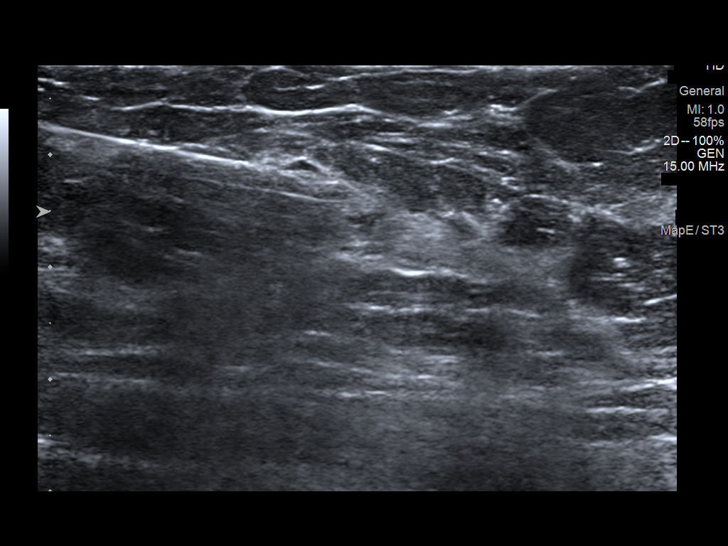
[im 7/14]
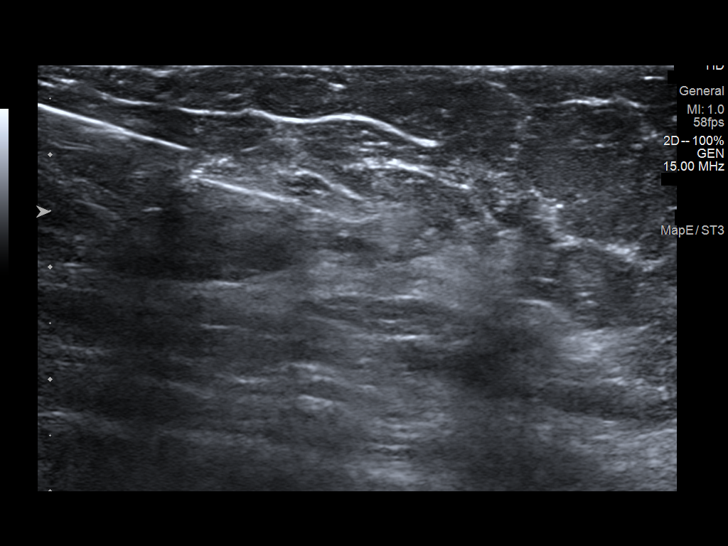
[im 8/14]
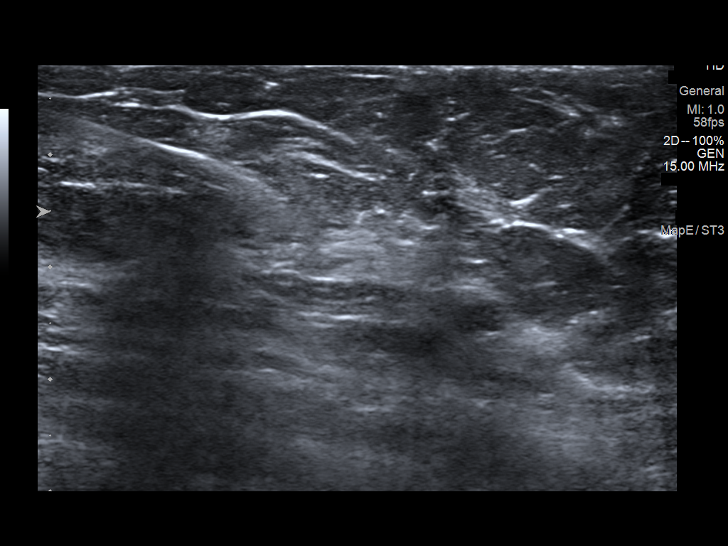
[im 9/14]
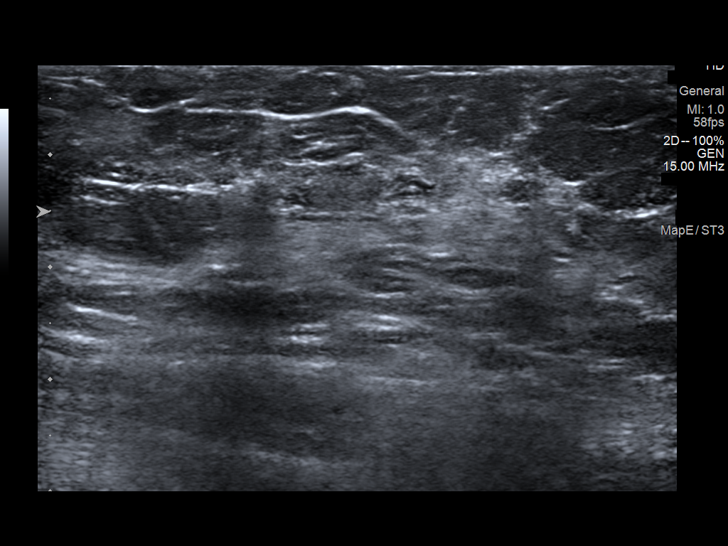
[im 10/14]
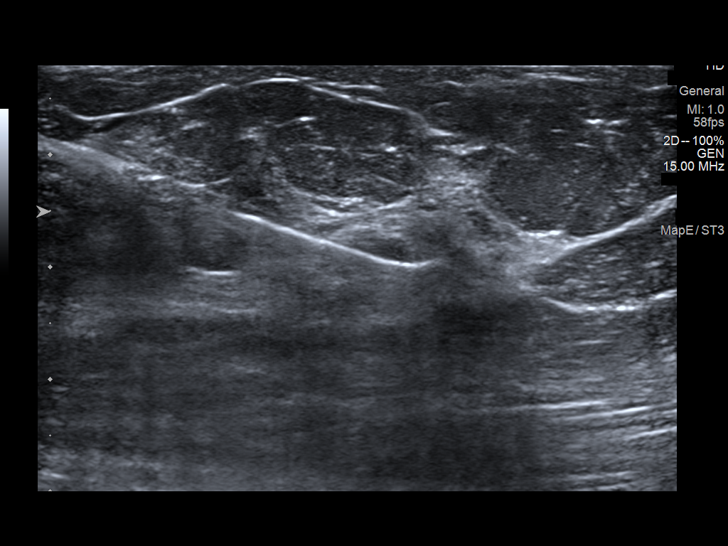
[im 12/14]
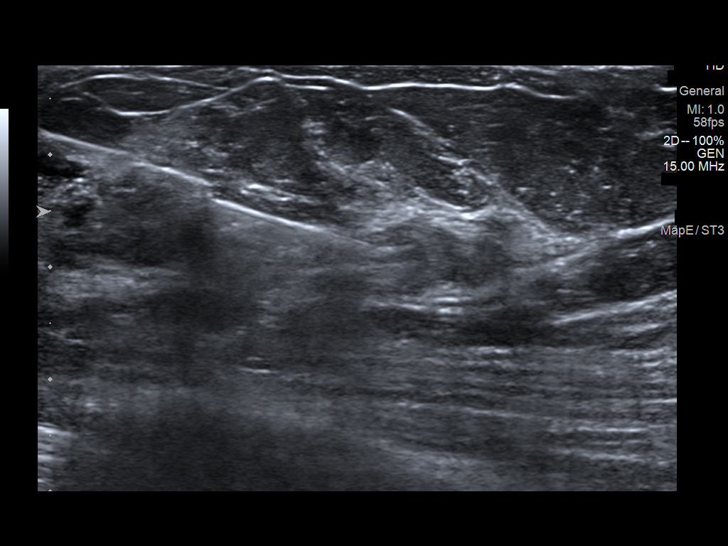
[im 13/14]
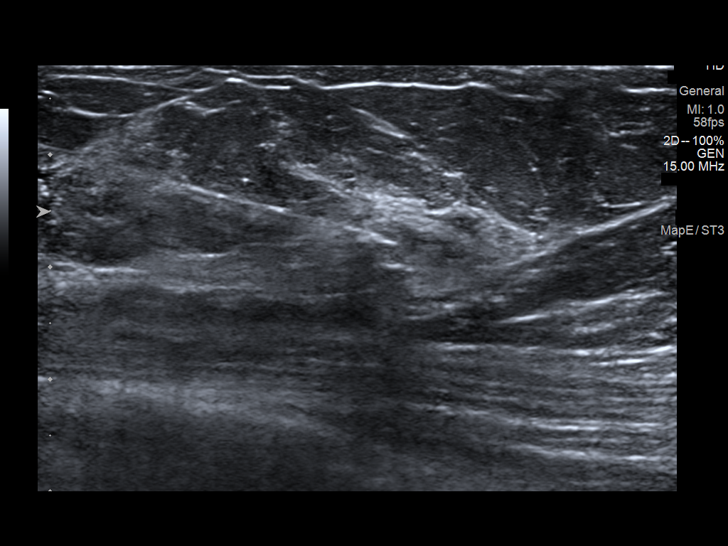
[im 14/14]
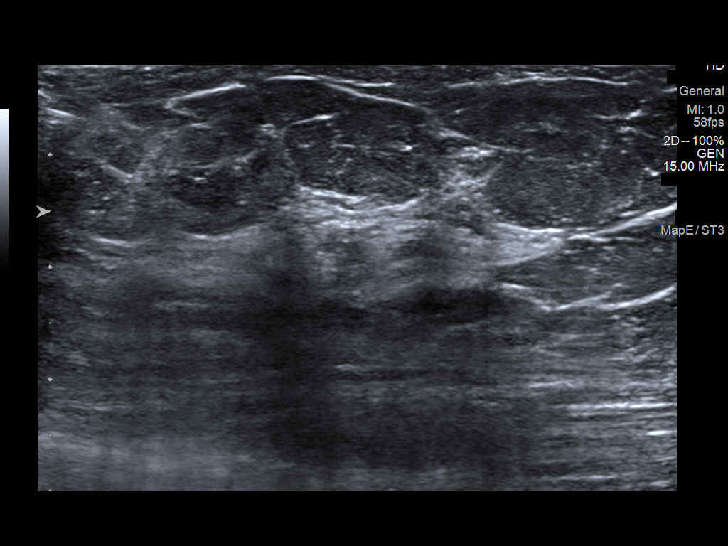

[12 of 14 positions shown; findings below may reference images not displayed]



Lesion quadrant: Lower outer quadrant

Using sterile technique and 1% Lidocaine as local anesthetic, under
direct ultrasound visualization, a 12 gauge KRYSTIAN device was
used to perform biopsy of a mass at the 3 o'clock position using a
inferior approach. At the conclusion of the procedure heart shaped
tissue marker clip was deployed into the biopsy cavity.

Lesion quadrant: Upper inner quadrant

Using sterile technique and 1% Lidocaine as local anesthetic, under
direct ultrasound visualization, a 14 gauge KRYSTIAN device was
used to perform biopsy of a mass at the 11 o'clock position using a
inferior approach. At the conclusion of the procedure Q shaped
tissue marker clip was deployed into the biopsy cavity.

Follow up 2 view mammogram was performed and dictated separately.
IMPRESSION: Ultrasound guided biopsy of the left breast x2. No apparent
complications.

ADDENDUM:
Pathology revealed FIBROCYSTIC CHANGE, INTRADUCTAL PAPILLOMA of the
LEFT breast, 3 o'clock [98] (heart clip). This was found to be
concordant by Dr. KRYSTIAN, with excision recommended.

Pathology revealed FIBROCYSTIC CHANGE of the LEFT breast, 11 o'clock
[98] (Q clip). This was found to be concordant by Dr. KRYSTIAN
KRYSTIAN.

Pathology results were discussed with the patient by telephone. The
patient reported doing well after the biopsies with tenderness at
the sites. Post biopsy instructions and care were reviewed and
questions were answered. The patient was encouraged to call The
direct phone number was provided.

The patient has a recent diagnosis of LEFT breast cancer and should
follow her outlined treatment plan.

The patient is scheduled for MRI guided biopsy x 3 on [DATE]. Further recommendations will be guided by the results of these
biopsies. Please see MRI report dated [DATE] for full
recommendations.

Dr. KRYSTIAN was notified of biopsy results via [REDACTED] message on
[DATE].

Pathology results reported by KRYSTIAN, RN on [DATE].



Lesion quadrant: Lower outer quadrant

Using sterile technique and 1% Lidocaine as local anesthetic, under
direct ultrasound visualization, a 12 gauge KRYSTIAN device was
used to perform biopsy of a mass at the 3 o'clock position using a
inferior approach. At the conclusion of the procedure heart shaped
tissue marker clip was deployed into the biopsy cavity.

Lesion quadrant: Upper inner quadrant

Using sterile technique and 1% Lidocaine as local anesthetic, under
direct ultrasound visualization, a 14 gauge KRYSTIAN device was
used to perform biopsy of a mass at the 11 o'clock position using a
inferior approach. At the conclusion of the procedure Q shaped
tissue marker clip was deployed into the biopsy cavity.

Follow up 2 view mammogram was performed and dictated separately.
IMPRESSION: Ultrasound guided biopsy of the left breast x2. No apparent
complications.

## 2020-01-21 ENCOUNTER — Other Ambulatory Visit: Payer: Self-pay | Admitting: Surgery

## 2020-01-21 ENCOUNTER — Encounter: Payer: Self-pay | Admitting: *Deleted

## 2020-01-21 DIAGNOSIS — R9389 Abnormal findings on diagnostic imaging of other specified body structures: Secondary | ICD-10-CM

## 2020-01-30 ENCOUNTER — Other Ambulatory Visit (HOSPITAL_COMMUNITY): Payer: Self-pay | Admitting: Diagnostic Radiology

## 2020-01-30 ENCOUNTER — Ambulatory Visit
Admission: RE | Admit: 2020-01-30 | Discharge: 2020-01-30 | Disposition: A | Discharge status: Home / Self Care | Payer: 59 | Source: Ambulatory Visit | Attending: Surgery | Admitting: Surgery

## 2020-01-30 ENCOUNTER — Other Ambulatory Visit: Payer: Self-pay

## 2020-01-30 DIAGNOSIS — R9389 Abnormal findings on diagnostic imaging of other specified body structures: Secondary | ICD-10-CM

## 2020-01-30 HISTORY — PX: BREAST BIOPSY: SHX20

## 2020-01-30 IMAGING — MR MR BREAST BX W/ LOC DEV 1ST LEASION IMAGE BX SPEC MR GUIDE*R*
7 of 10 series · 30 of 48 positions shown · IV contrast (6 ml gadavist)
Comparison: Previous exams.
COMPARISON: Previous exams.

Addendum:
CLINICAL DATA: 58-year-old with biopsy-proven grade 3 invasive
ductal carcinoma involving the UPPER OUTER QUADRANT of the LEFT
breast at the 2:30 o'clock position, shown on a pretreatment MRI to
have an indeterminate 5 mm mass in the UPPER INNER QUADRANT of the
RIGHT breast at ANTERIOR depth and non-mass enhancement involving
the UPPER RIGHT breast at MIDDLE to POSTERIOR depth.

EXAM:
MRI GUIDED CORE NEEDLE BIOPSY OF THE RIGHT BREAST x 2
TECHNIQUE: Multiplanar, multisequence MR imaging of the RIGHT breast was
performed both before and after administration of intravenous
contrast.
CONTRAST:  6 mL Gadavist IV.

[Series 2: fiducial unilateral · sagittal · 2.0mm · 1.33mm/px · 3 of 52 slices shown]
[im 1/52]
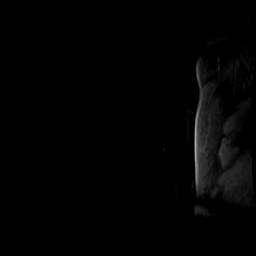
[im 26/52]
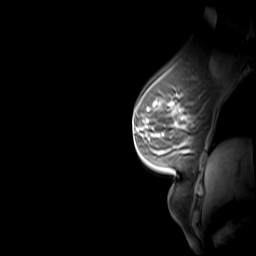
[im 52/52]
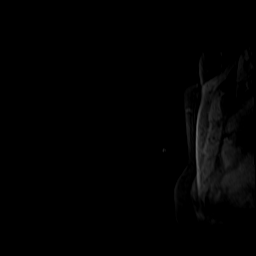

[Series 3: dynamic pre · axial · non-contrast · 1.3mm · 0.73mm/px · z∈[-65,+121]mm · 5 of 144 slices shown]
[im 1/144]
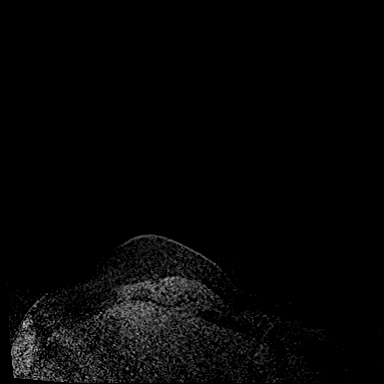
[im 36/144]
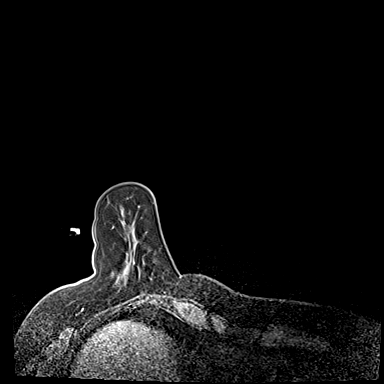
[im 72/144]
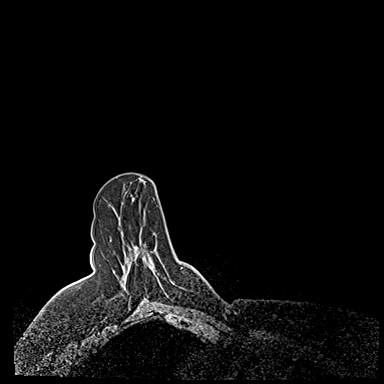
[im 108/144]
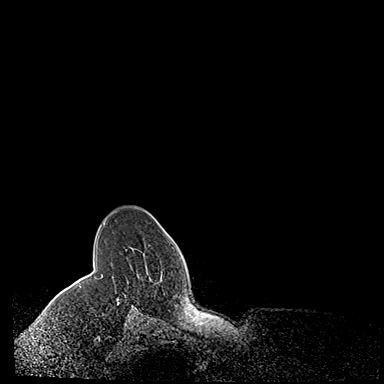
[im 144/144]
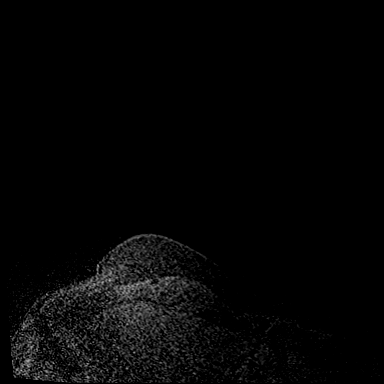

[Series 4: dynamic post 20 · axial · 1.3mm · 0.73mm/px · z∈[-65,+121]mm · 5 of 144 slices shown (1 of 2)]
[im 1/144]
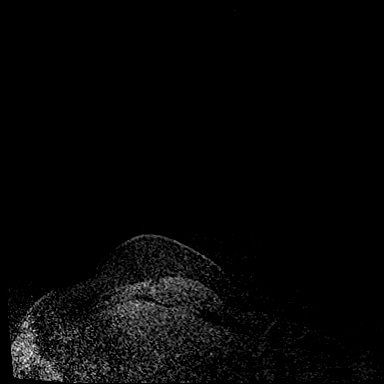
[im 36/144]
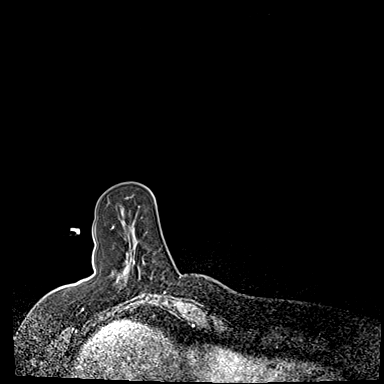
[im 72/144]
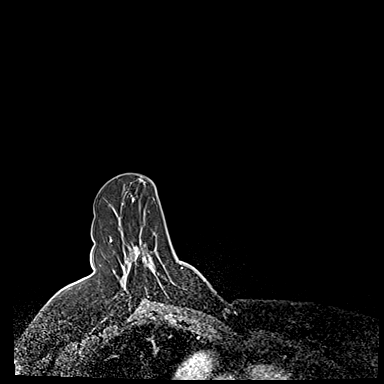
[im 108/144]
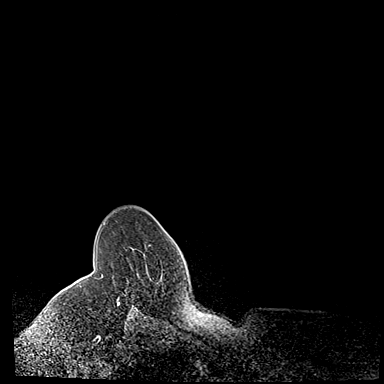
[im 144/144]
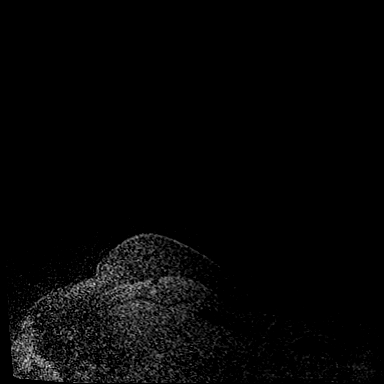

[Series 5: dynamic post 20 · axial · 1.3mm · 0.73mm/px · z∈[-65,+121]mm · 5 of 144 slices shown (2 of 2)]
[im 1/144]
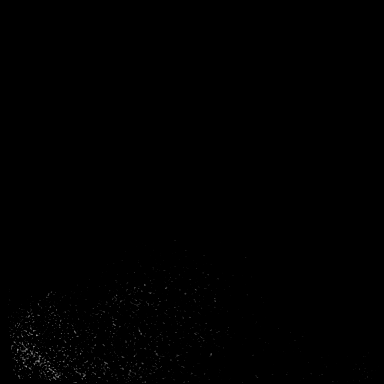
[im 36/144]
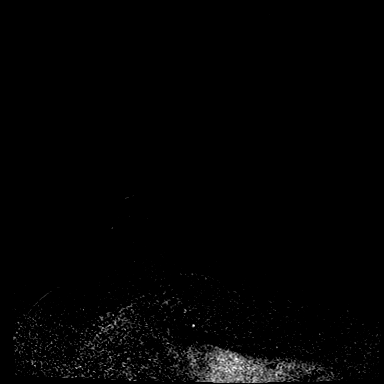
[im 72/144]
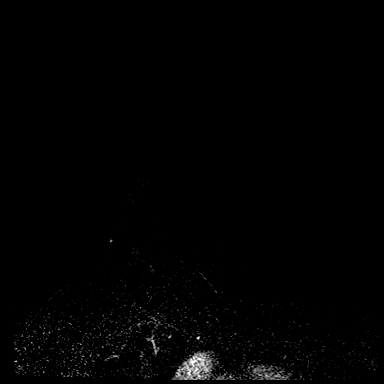
[im 108/144]
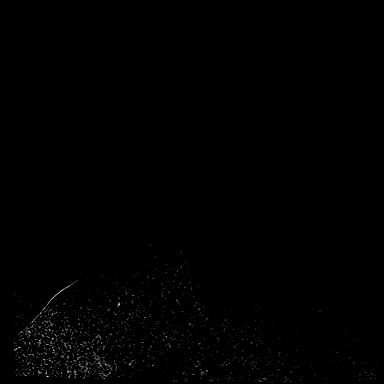
[im 144/144]
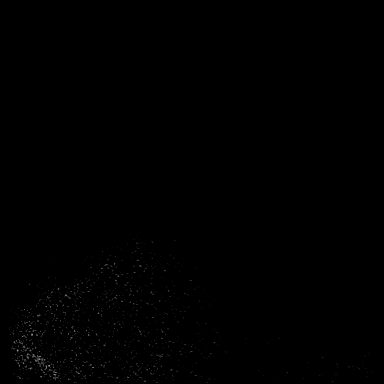

[Series 6: dynamic post 3 · axial · 1.3mm · 0.73mm/px · z∈[-65,+121]mm · 5 of 144 slices shown (1 of 2)]
[im 1/144]
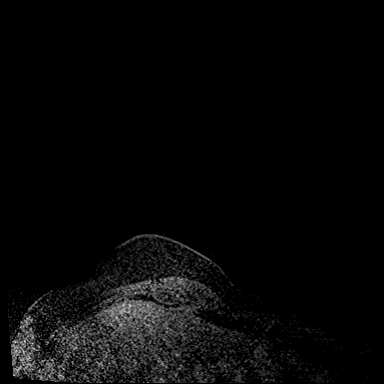
[im 36/144]
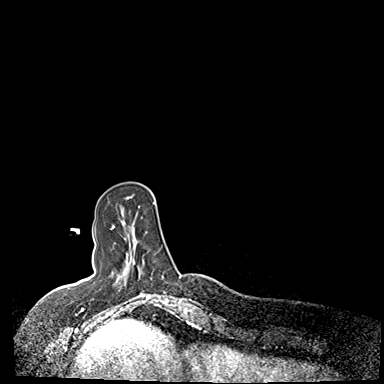
[im 72/144]
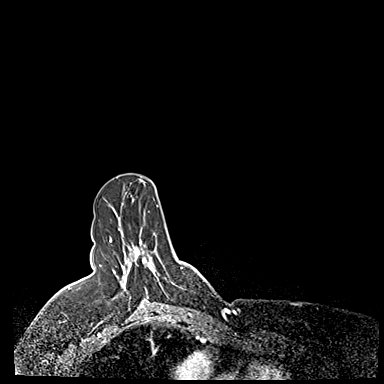
[im 108/144]
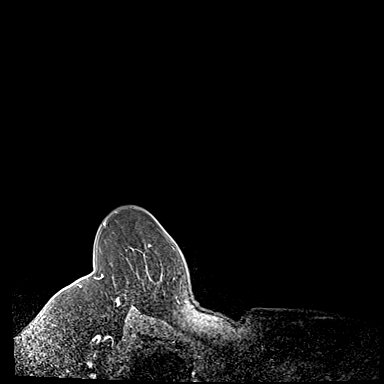
[im 144/144]
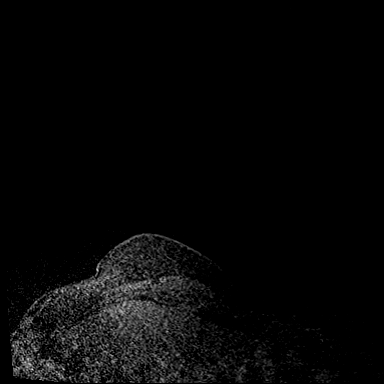

[Series 7: dynamic post 3 · axial · 1.3mm · 0.73mm/px · z∈[-65,+121]mm · 5 of 144 slices shown (2 of 2)]
[im 1/144]
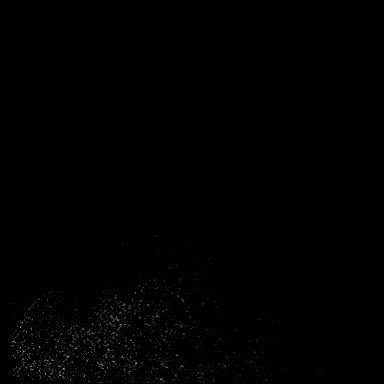
[im 36/144]
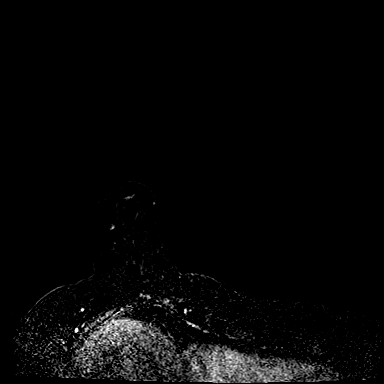
[im 72/144]
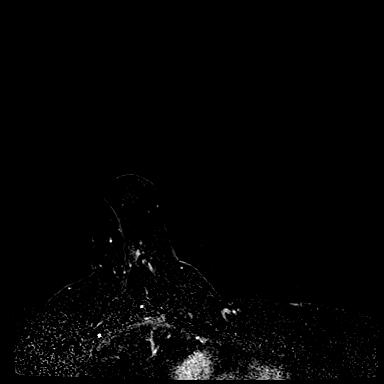
[im 108/144]
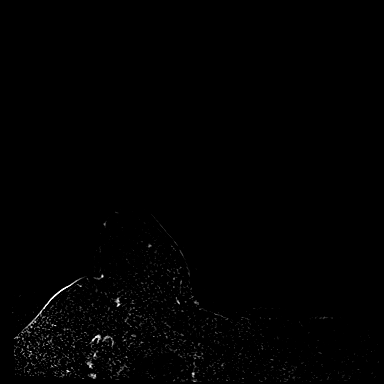
[im 144/144]
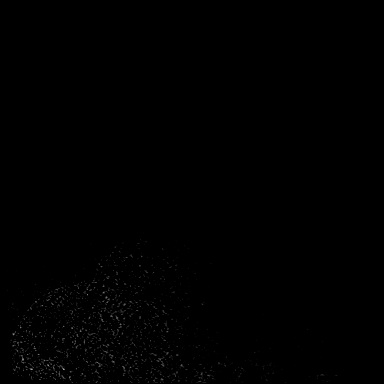

[Series 8: needle confirmation · axial · 1.3mm · 0.73mm/px · z∈[-65,-19]mm · 2 of 144 slices shown]
[im 1/144]
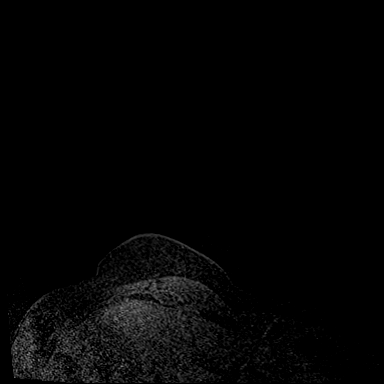
[im 36/144]
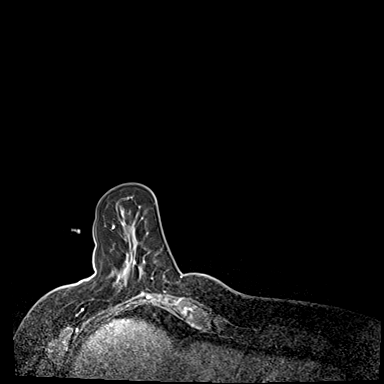

[30 of 48 positions shown; findings below may reference images not displayed]

FINDINGS: I met with the patient, and we discussed the procedure of MRI guided
biopsy, including risks, benefits, and alternatives. Specifically,
we discussed the risks of infection, bleeding, tissue injury, clip
migration, and inadequate sampling. Informed, written consent was
given. The usual time out protocol was performed immediately prior
to the procedure.

# 1) 5 mm mass, lesion quadrant: UPPER INNER QUADRANT

Using sterile technique with chlorhexidine as skin antisepsis, 1%
lidocaine and 1% lidocaine with epinephrine as local anesthetic,
using MRI guidance, a 9 gauge vacuum assisted device was used to
perform biopsy of the 5 mm mass in the UPPER INNER QUADRANT at
ANTERIOR depth using a lateral approach. At the conclusion of the
procedure, a cylinder shaped tissue marker clip was deployed into
the biopsy cavity.

# 2) non-mass enhancement, lesion quadrant: UPPER breast, slight
UPPER INNER QUADRANT.

Using sterile technique with chlorhexidine as skin antisepsis, 1%
lidocaine and 1% lidocaine with epinephrine as local anesthetic,
using MRI guidance, a 9 gauge vacuum assisted device was used
perform biopsy of the non-mass enhancement in the UPPER breast at
MIDDLE to POSTERIOR depth using a lateral approach. At the
conclusion of the procedure, a barbell shaped tissue marker clip was
deployed into the biopsy cavity.

Follow-up 2-view mammogram was performed and dictated separately.
IMPRESSION: MRI guided biopsy of a 5 mm mass in the UPPER INNER QUADRANT of the
RIGHT breast at ANTERIOR depth and biopsy of non-mass enhancement in
the UPPER INNER QUADRANT of the RIGHT breast at MIDDLE depth. No
apparent complications.

ADDENDUM:
Pathology revealed DUCTAL PAPILLOMA WITH USUAL DUCT EPITHELIAL
HYPERPLASIA, PAPILLARY APOCRINE METAPLASIA, FIBROCYSTIC CHANGE of
the RIGHT breast, upper inner quadrant, anterior depth. This was
found to be concordant by Dr. KINKEI, with excision
recommended.

Pathology revealed PAPILLARY APOCRINE METAPLASIA, FIBROCYSTIC CHANGE
of the RIGHT breast, at middle to posterior depth. This was found to
be concordant by Dr. KINKEI.

Pathology results were discussed with the patient by telephone. The
patient reported doing well after the biopsies with tenderness at
the sites. Post biopsy instructions and care were reviewed and
questions were answered. The patient was encouraged to call The
direct phone number was provided.

The patient has a recent diagnosis of LEFT breast cancer and should
follow her outlined treatment plan. Recommend MRI guided biopsy of
the clumped non mass enhancement in the superior slightly medial
LEFT breast.

Recommendation for annual screening MRI due to heterogeneously dense
breasts.

Dr. KINKEI was notified of pathology results and
recommendations via [REDACTED] message on [DATE].

Pathology results reported by KINKEI, RN on [DATE].

*** End of Addendum ***
FINDINGS: I met with the patient, and we discussed the procedure of MRI guided
biopsy, including risks, benefits, and alternatives. Specifically,
we discussed the risks of infection, bleeding, tissue injury, clip
migration, and inadequate sampling. Informed, written consent was
given. The usual time out protocol was performed immediately prior
to the procedure.

# 1) 5 mm mass, lesion quadrant: UPPER INNER QUADRANT

Using sterile technique with chlorhexidine as skin antisepsis, 1%
lidocaine and 1% lidocaine with epinephrine as local anesthetic,
using MRI guidance, a 9 gauge vacuum assisted device was used to
perform biopsy of the 5 mm mass in the UPPER INNER QUADRANT at
ANTERIOR depth using a lateral approach. At the conclusion of the
procedure, a cylinder shaped tissue marker clip was deployed into
the biopsy cavity.

# 2) non-mass enhancement, lesion quadrant: UPPER breast, slight
UPPER INNER QUADRANT.

Using sterile technique with chlorhexidine as skin antisepsis, 1%
lidocaine and 1% lidocaine with epinephrine as local anesthetic,
using MRI guidance, a 9 gauge vacuum assisted device was used
perform biopsy of the non-mass enhancement in the UPPER breast at
MIDDLE to POSTERIOR depth using a lateral approach. At the
conclusion of the procedure, a barbell shaped tissue marker clip was
deployed into the biopsy cavity.

Follow-up 2-view mammogram was performed and dictated separately.
IMPRESSION: MRI guided biopsy of a 5 mm mass in the UPPER INNER QUADRANT of the
RIGHT breast at ANTERIOR depth and biopsy of non-mass enhancement in
the UPPER INNER QUADRANT of the RIGHT breast at MIDDLE depth. No
apparent complications.

## 2020-01-30 IMAGING — MG MM BREAST LOCALIZATION CLIP
4 series · 4 of 12 positions shown · non-contrast
Comparison: Previous exam(s).

CLINICAL DATA: Confirmation of clip placement after MRI guided
biopsy of a mass in the UPPER INNER QUADRANT of the RIGHT breast at
ANTERIOR depth and biopsy of non-mass enhancement in the UPPER
breast, slight UPPER INNER QUADRANT, at MIDDLE to POSTERIOR depth.

EXAM:
2D AND TOMOSYNTHESIS DIAGNOSTIC RIGHT MAMMOGRAM POST MRI BIOPSY x 2

[R ML synth-2D]
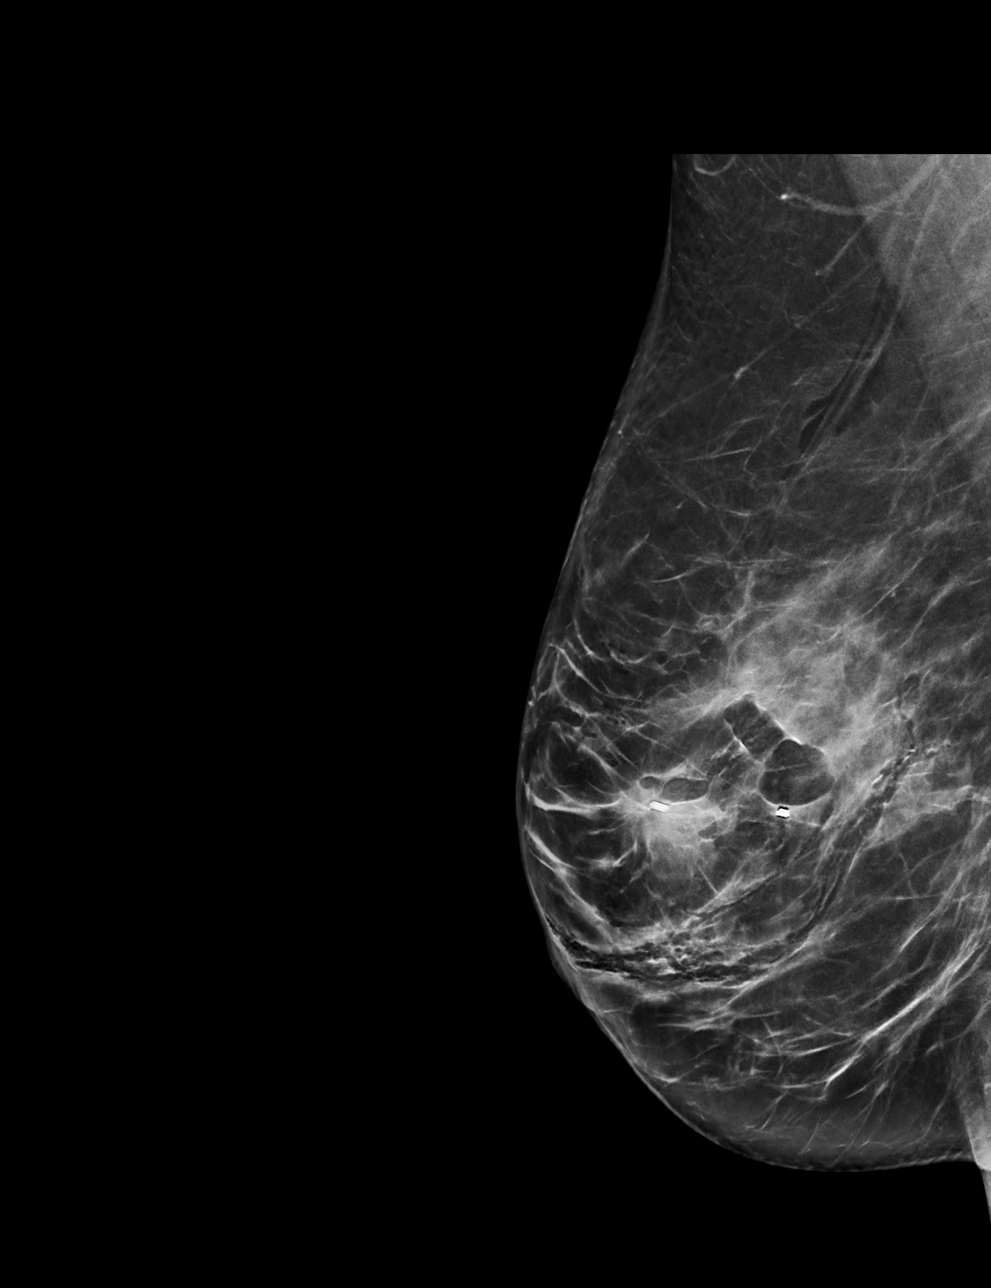

[R CC synth-2D]
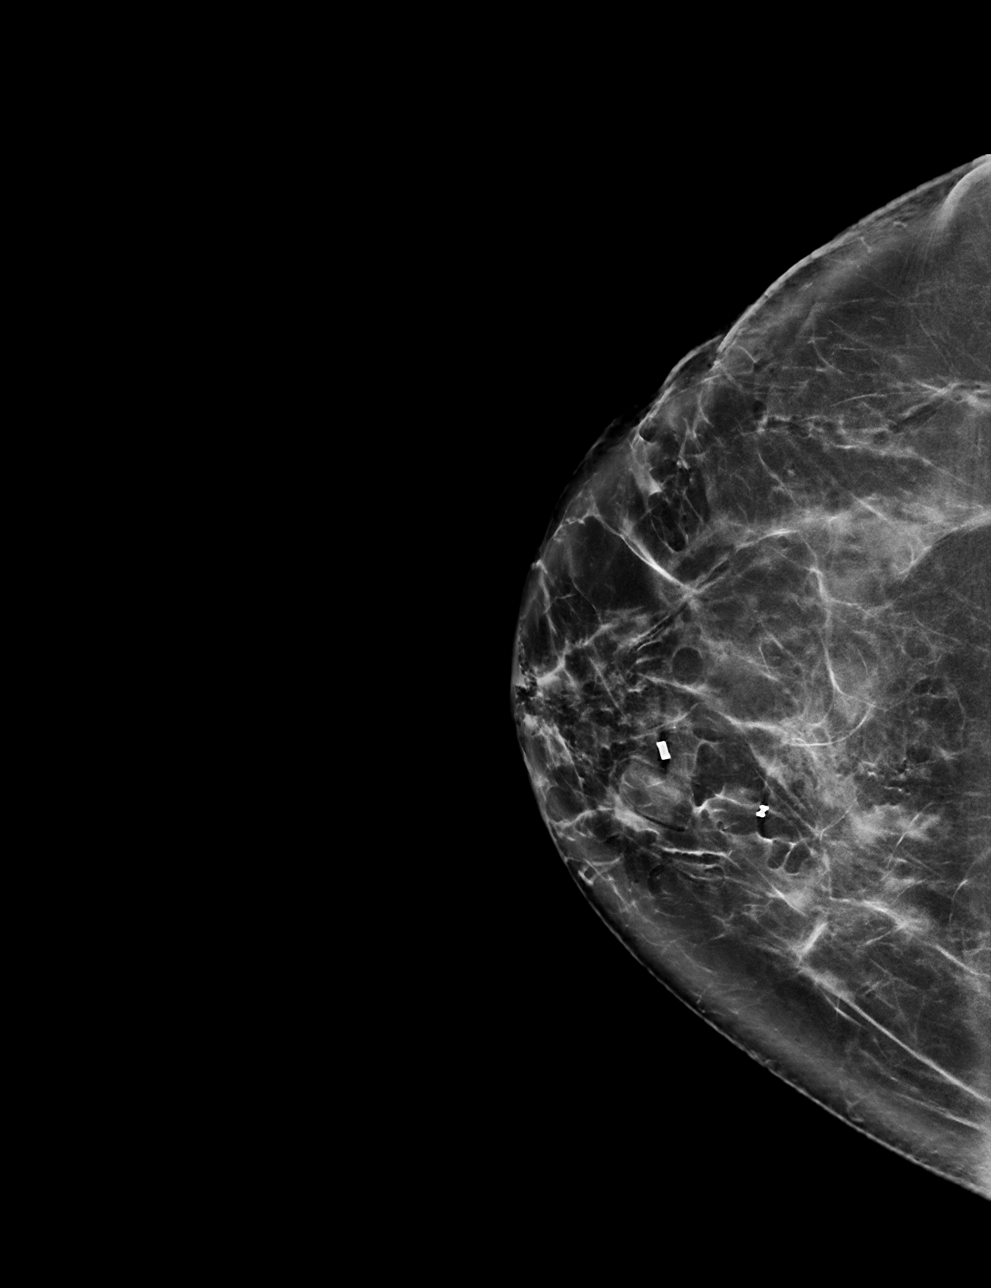

[R ML tomo · tomo slice 37/74.0]
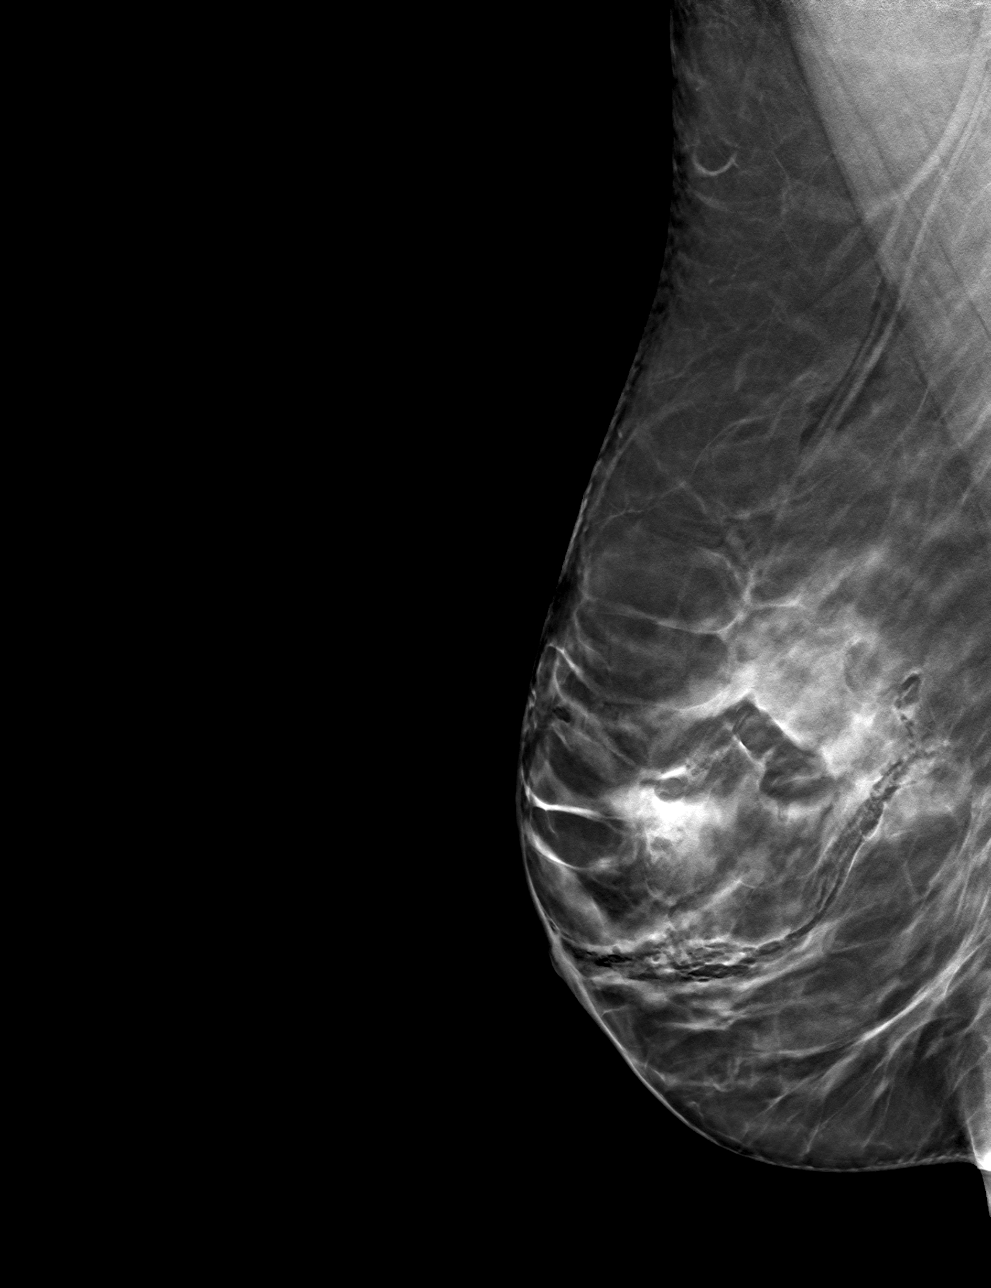

[R CC tomo · tomo slice 39/78.0]
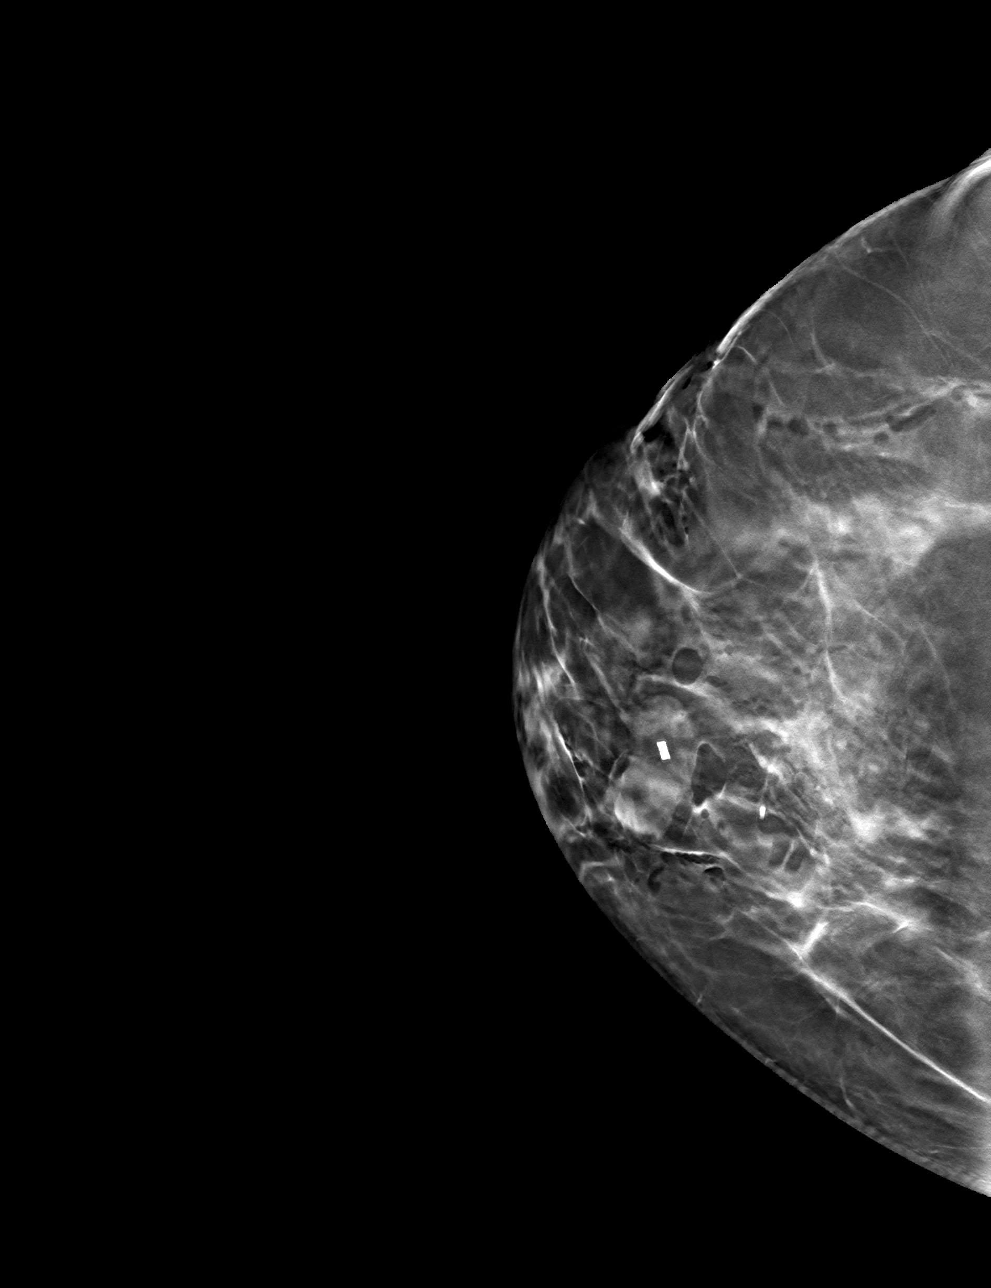

[4 of 12 positions shown; findings below may reference images not displayed]

FINDINGS: Tomosynthesis and synthesized full field CC and mediolateral images
were obtained following MRI guided biopsy of a mass in the UPPER
INNER RIGHT breast at ANTERIOR depth and non-mass enhancement in the
UPPER breast at MIDDLE to POSTERIOR depth.

The cylinder shaped tissue marking clip is appropriately position in
the UPPER INNER QUADRANT at ANTERIOR depth at the site of the
biopsied mass.

The dumbbell-shaped tissue marker clip fell into the dependent
portion of the biopsy cavity and is approximately 1 cm INFERIOR to
the site of the biopsied non-mass enhancement in the UPPER INNER
QUADRANT at MIDDLE depth.

The clips are separated by approximately 1.7 cm.

Expected post biopsy changes with fluid-filled biopsy cavities are
present at both sites without evidence of hematoma.
IMPRESSION: 1. Appropriate positioning of the cylinder shaped tissue marking
clip at the site of the biopsied mass in the UPPER INNER QUADRANT of
the RIGHT breast at ANTERIOR depth.
2. Approximate 1 cm INFERIOR migration of the dumbbell-shaped tissue
marker clip relative to the biopsied non-mass enhancement in the
UPPER INNER QUADRANT of the RIGHT breast at MIDDLE depth. The clip
is in the dependent portion of the biopsy cavity.
3. The clips are separated by approximately 1.7 cm.

Final Assessment: Post Procedure Mammograms for Marker Placement

## 2020-01-30 MED ORDER — GADOBUTROL 1 MMOL/ML IV SOLN
6.0000 mL | Freq: Once | INTRAVENOUS | Status: AC | PRN
  Administered 2020-01-30: 6 mL via INTRAVENOUS

## 2020-02-03 ENCOUNTER — Other Ambulatory Visit: Payer: Self-pay | Admitting: Surgery

## 2020-02-03 ENCOUNTER — Encounter: Payer: Self-pay | Admitting: *Deleted

## 2020-02-03 DIAGNOSIS — R9389 Abnormal findings on diagnostic imaging of other specified body structures: Secondary | ICD-10-CM

## 2020-02-04 ENCOUNTER — Encounter: Payer: Self-pay | Admitting: *Deleted

## 2020-02-13 ENCOUNTER — Ambulatory Visit
Admission: RE | Admit: 2020-02-13 | Discharge: 2020-02-13 | Disposition: A | Discharge status: Home / Self Care | Payer: 59 | Source: Ambulatory Visit | Attending: Surgery | Admitting: Surgery

## 2020-02-13 ENCOUNTER — Other Ambulatory Visit (HOSPITAL_COMMUNITY): Payer: Self-pay | Admitting: Diagnostic Radiology

## 2020-02-13 ENCOUNTER — Other Ambulatory Visit: Payer: Self-pay

## 2020-02-13 DIAGNOSIS — R9389 Abnormal findings on diagnostic imaging of other specified body structures: Secondary | ICD-10-CM

## 2020-02-13 HISTORY — PX: BREAST BIOPSY: SHX20

## 2020-02-13 IMAGING — MG MM BREAST LOCALIZATION CLIP
4 series · 4 of 12 positions shown · non-contrast
Comparison: Previous exam(s).

CLINICAL DATA: Status post MR guided core needle biopsy of a
recently demonstrated 8 mm area of clumped non mass enhancement in
upper inner quadrant of the left breast.

EXAM:
DIAGNOSTIC LEFT MAMMOGRAM POST MRI BIOPSY

[L ML synth-2D]
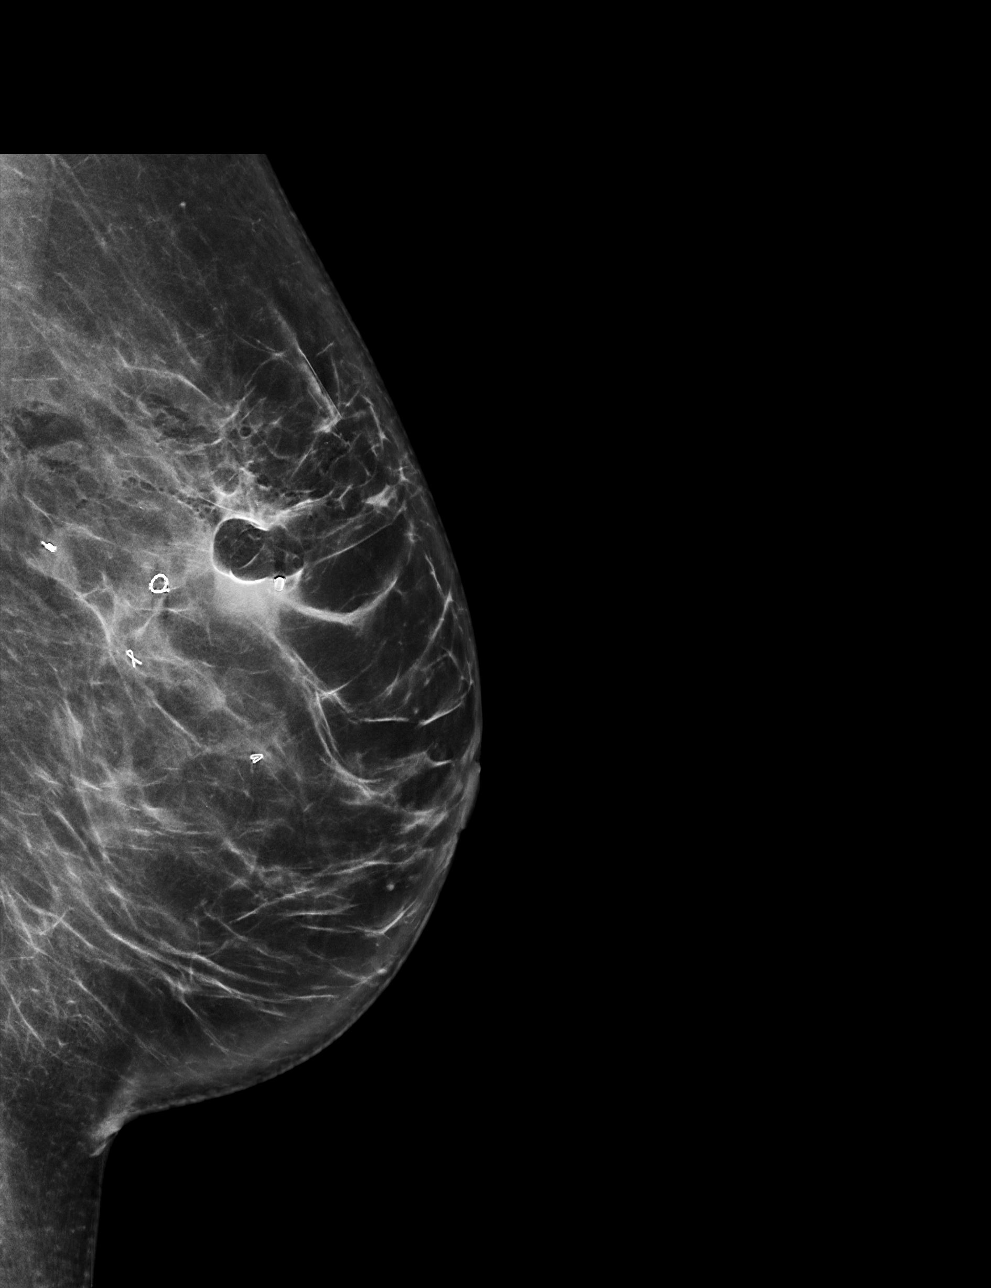

[L CC synth-2D]
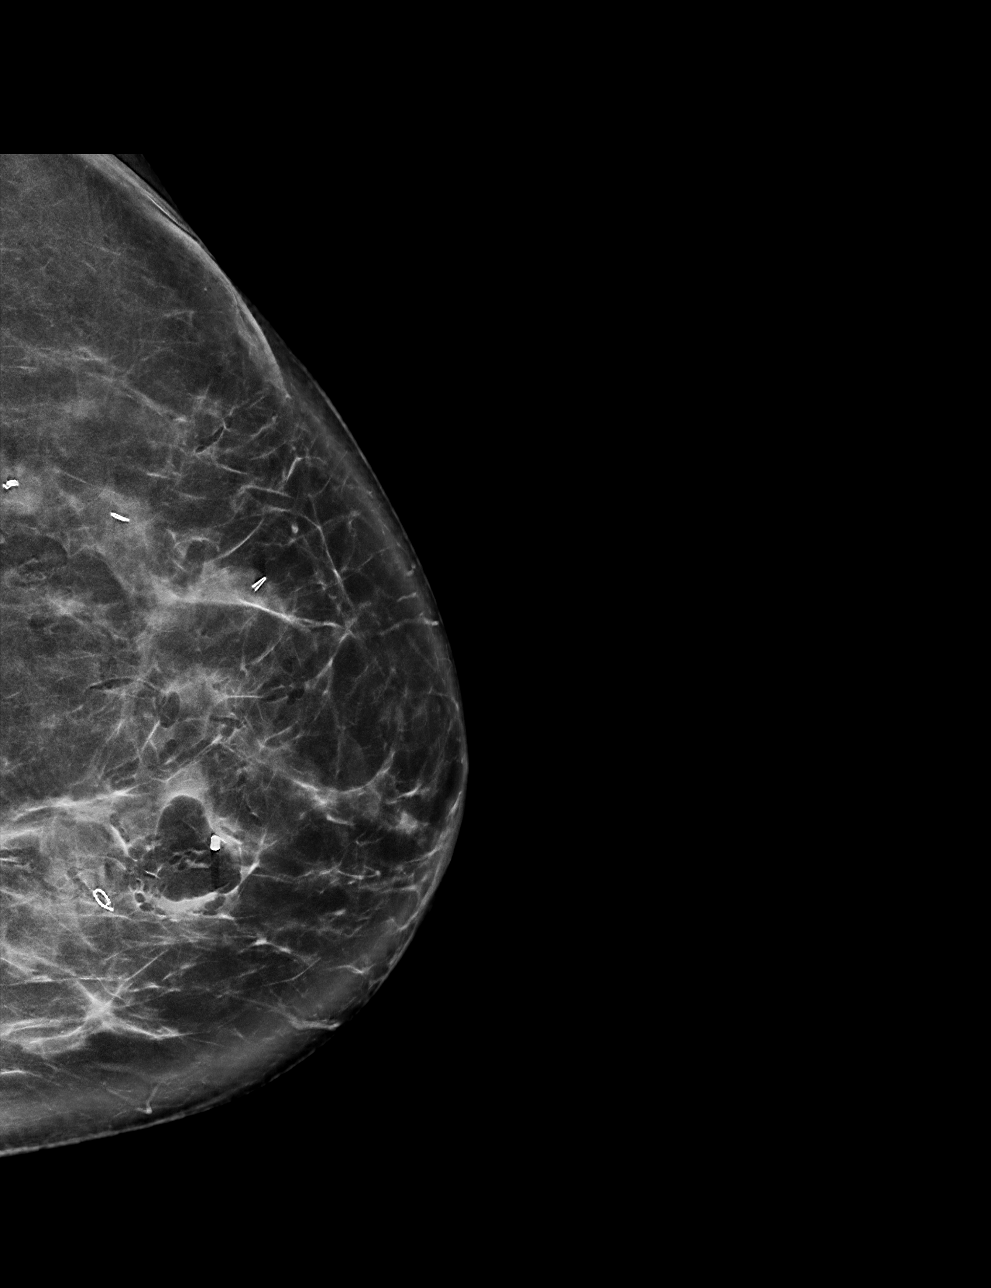

[L CC tomo · tomo slice 38/75.0]
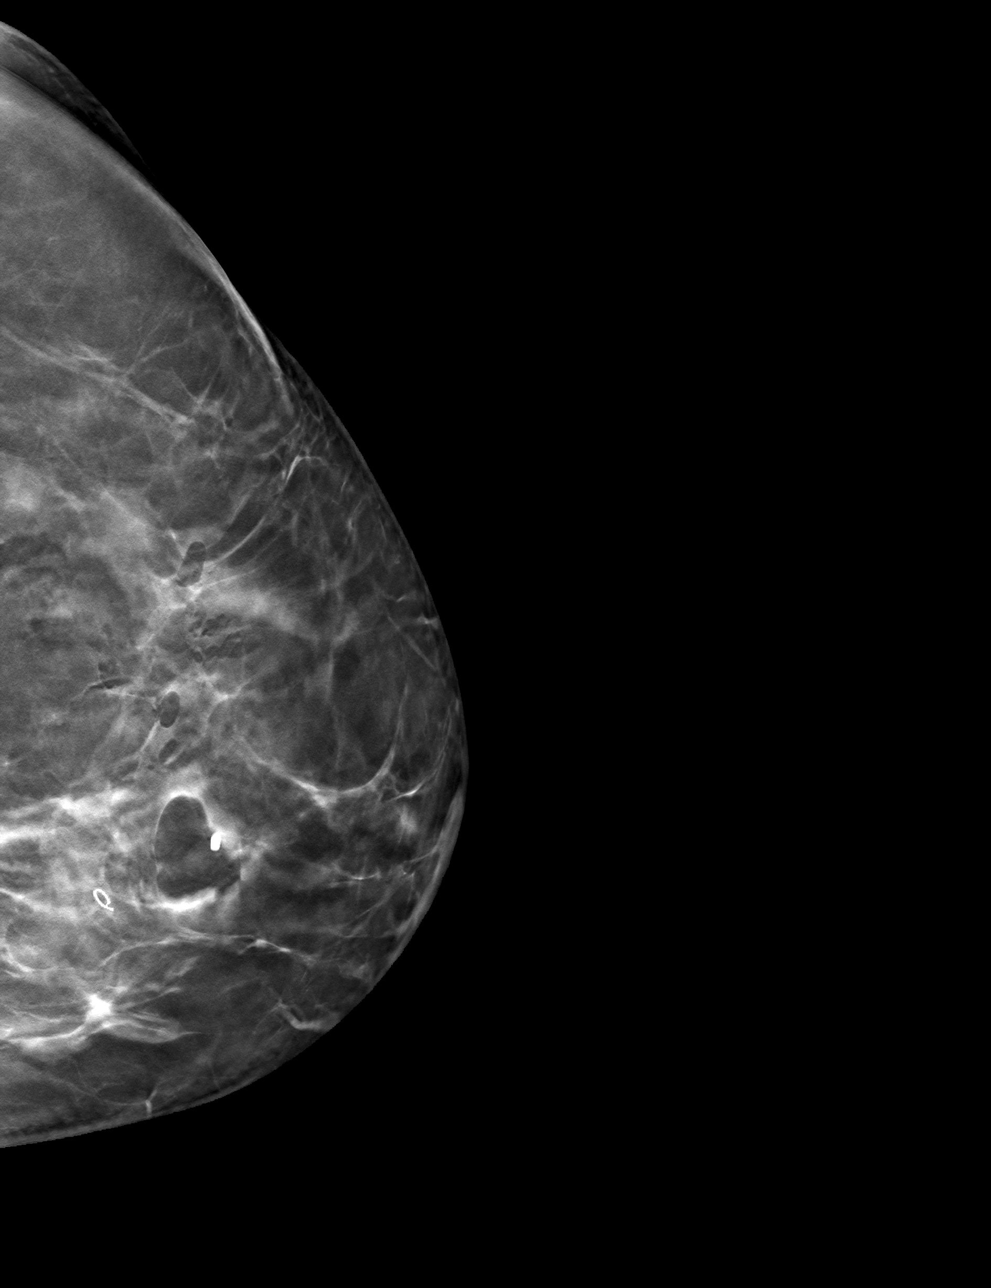

[L ML tomo · tomo slice 40/79.0]
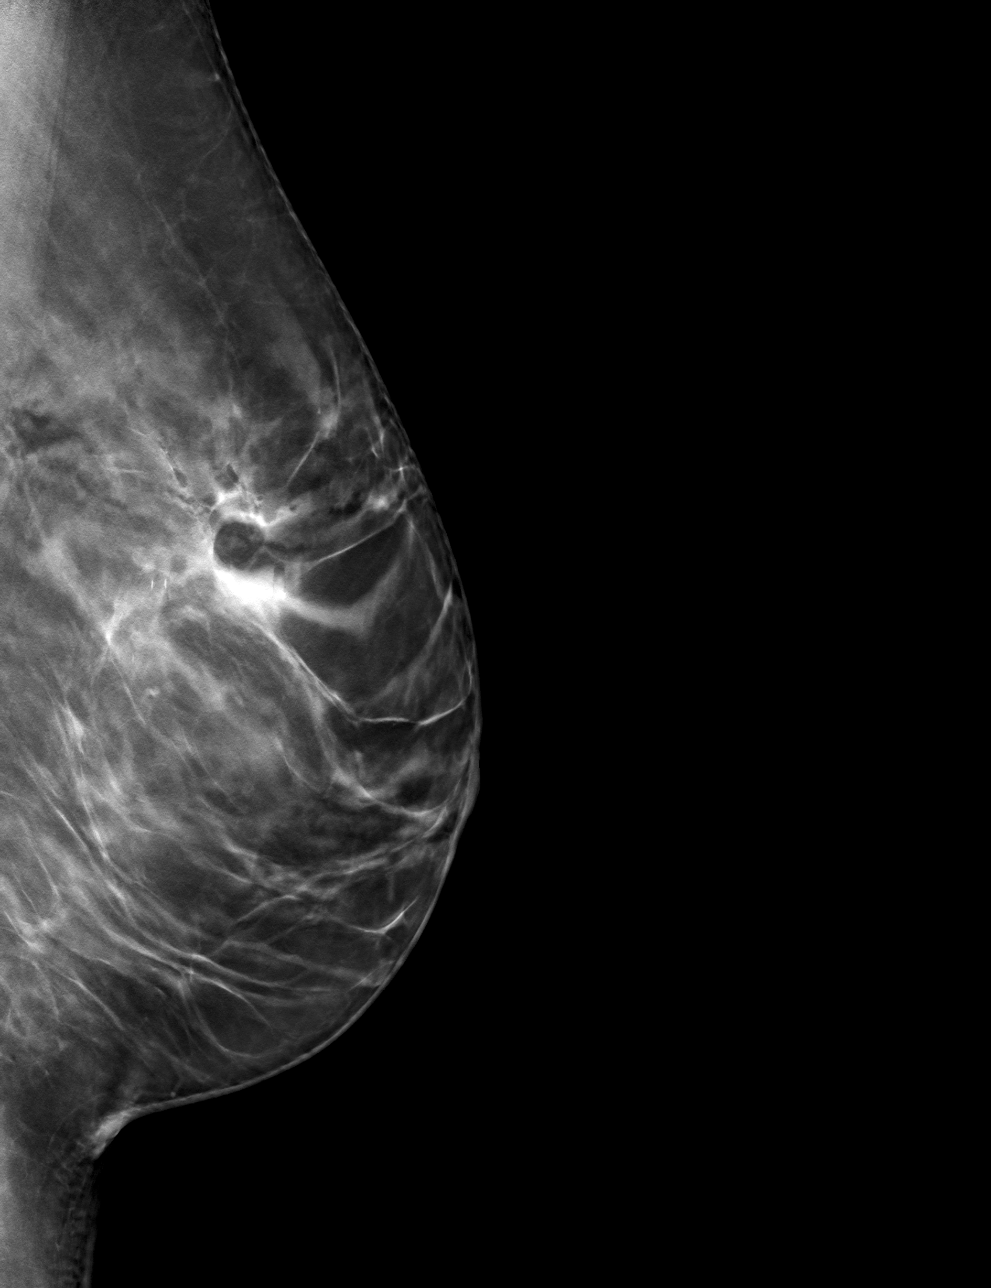

[4 of 12 positions shown; findings below may reference images not displayed]

FINDINGS: Mammographic images were obtained following MRI guided biopsy of the
recently demonstrated 8 mm area of clumped non mass enhancement in
the upper, slightly inner left breast. The biopsy marking clip is in
expected position at the site of biopsy.
IMPRESSION: Appropriate positioning of the cylinder shaped biopsy marking clip
at the site of biopsy in the upper, slightly inner left breast.

Final Assessment: Post Procedure Mammograms for Marker Placement

## 2020-02-13 IMAGING — MR MR BREAST BX W LOC DEV 1ST LESION IMAGE BX SPEC MR GUIDE*L*
8 of 12 series · 32 of 48 positions shown · IV contrast (gadavist)
Comparison: Previous exams.
COMPARISON: Previous exams.

Addendum:
CLINICAL DATA: 8 mm area of clumped, non mass enhancement in the
upper inner left breast on a recent staging MRI of the breasts for
recently diagnosed left breast cancer.

EXAM:
MRI GUIDED CORE NEEDLE BIOPSY OF THE LEFT BREAST
TECHNIQUE: Multiplanar, multisequence MR imaging of the left breast was
performed both before and after administration of intravenous
contrast.
CONTRAST:  7mL GADAVIST GADOBUTROL 1 MMOL/ML IV SOLN

[Series 2: fiducial unilateral · sagittal · 2.0mm · 1.33mm/px · 2 of 52 slices shown]
[im 1/52]
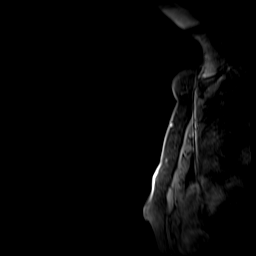
[im 52/52]
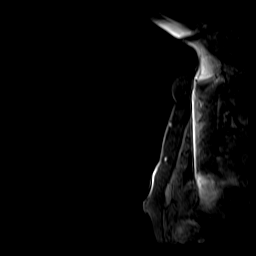

[Series 3: dynamic pre · axial · non-contrast · 1.3mm · 0.73mm/px · z∈[-106,+101]mm · 5 of 160 slices shown]
[im 1/160]
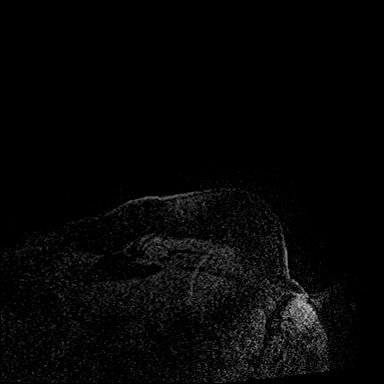
[im 40/160]
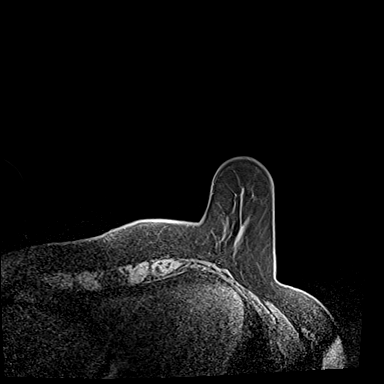
[im 80/160]
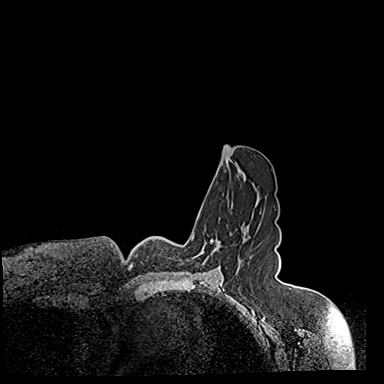
[im 120/160]
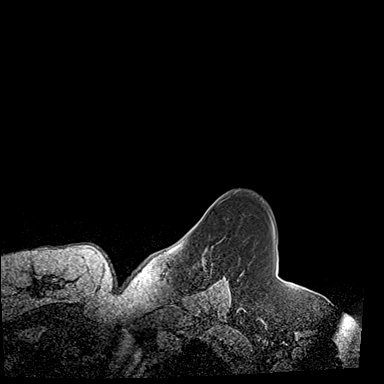
[im 160/160]
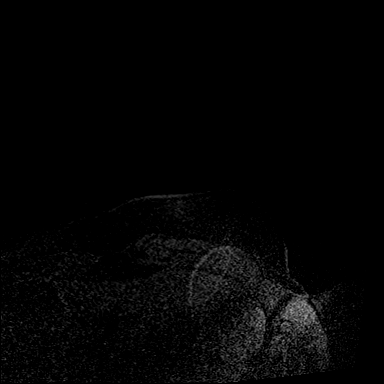

[Series 4: dynamic post 20 · axial · 1.3mm · 0.73mm/px · z∈[-106,+101]mm · 5 of 160 slices shown (1 of 2)]
[im 1/160]
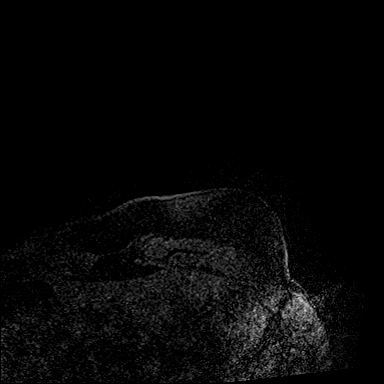
[im 40/160]
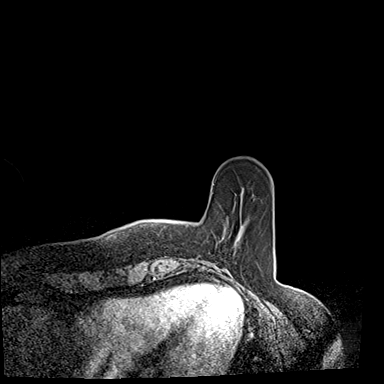
[im 80/160]
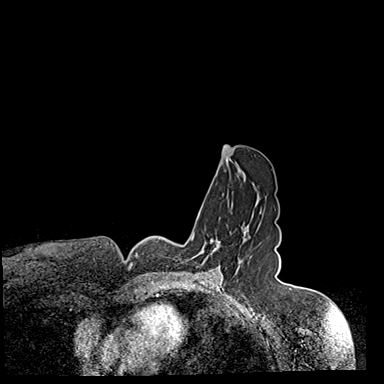
[im 120/160]
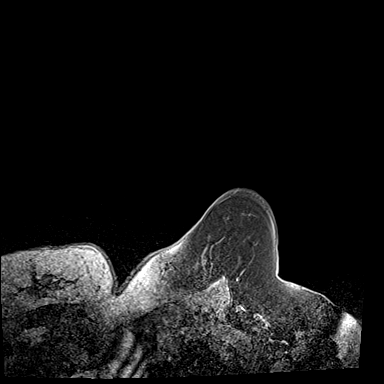
[im 160/160]
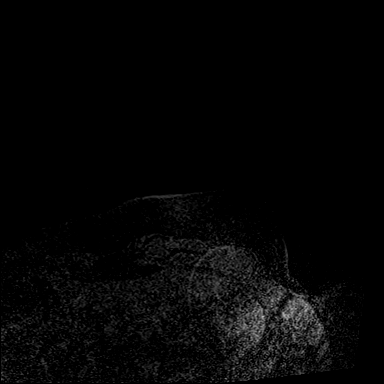

[Series 5: dynamic post 20 · axial · 1.3mm · 0.73mm/px · z∈[-106,+101]mm · 4 of 160 slices shown (2 of 2)]
[im 1/160]
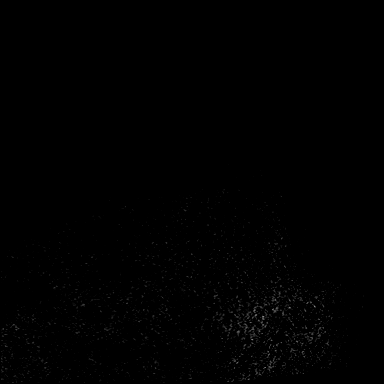
[im 54/160]
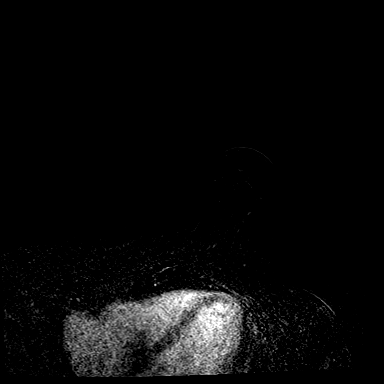
[im 107/160]
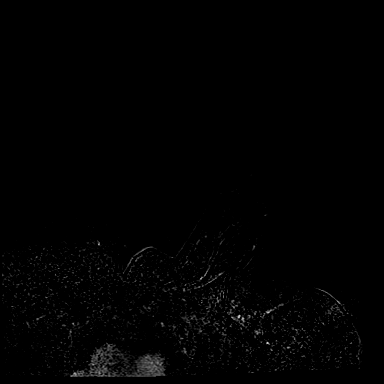
[im 160/160]
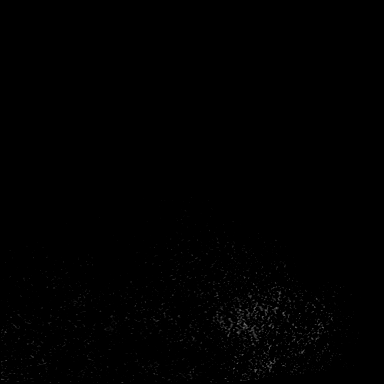

[Series 6: dynamic post 3 · axial · 1.3mm · 0.73mm/px · z∈[-106,+101]mm · 4 of 160 slices shown (1 of 2)]
[im 1/160]
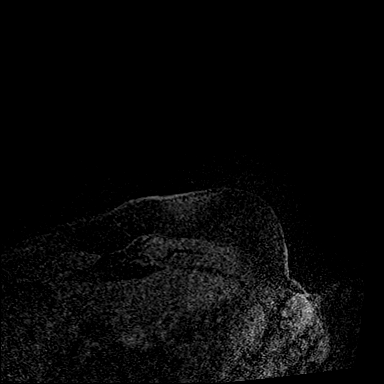
[im 54/160]
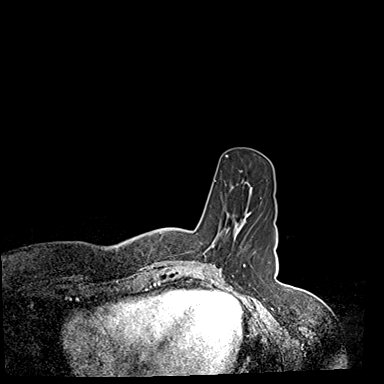
[im 107/160]
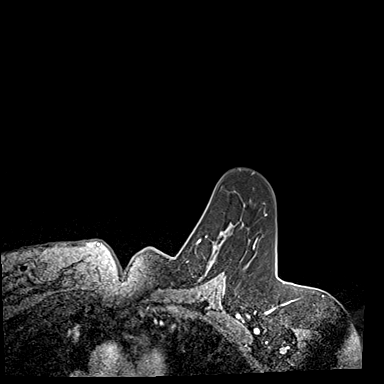
[im 160/160]
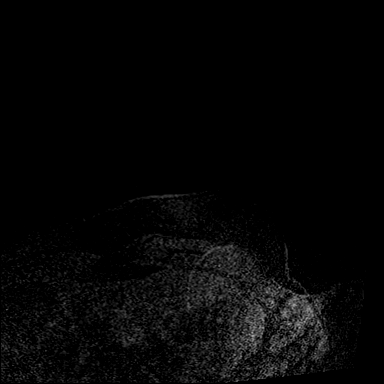

[Series 7: dynamic post 3 · axial · 1.3mm · 0.73mm/px · z∈[-106,+101]mm · 4 of 160 slices shown (2 of 2)]
[im 1/160]
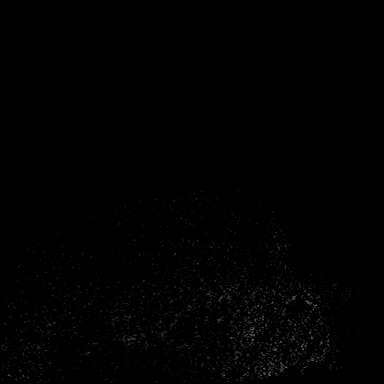
[im 54/160]
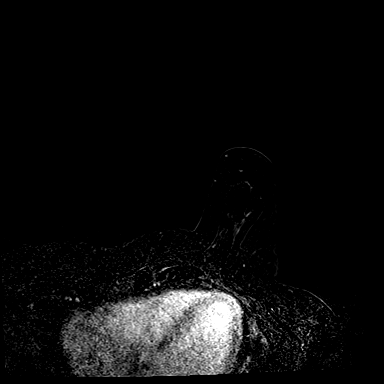
[im 107/160]
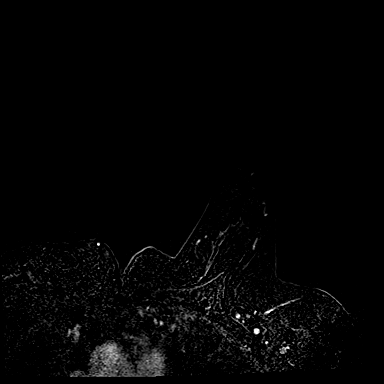
[im 160/160]
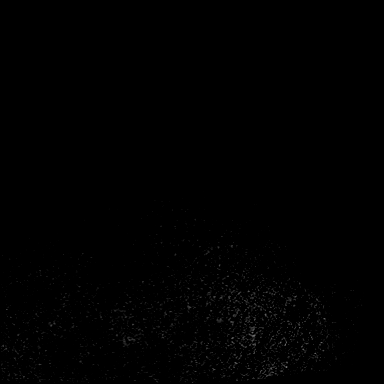

[Series 8: dynamic post 5 · axial · 1.3mm · 0.73mm/px · z∈[-106,+101]mm · 4 of 160 slices shown (1 of 2)]
[im 1/160]
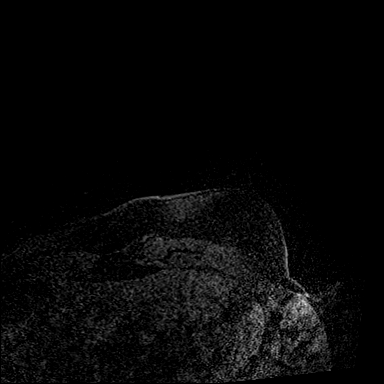
[im 54/160]
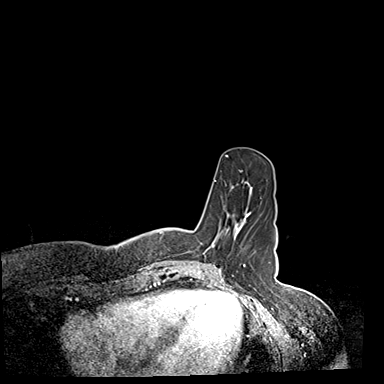
[im 107/160]
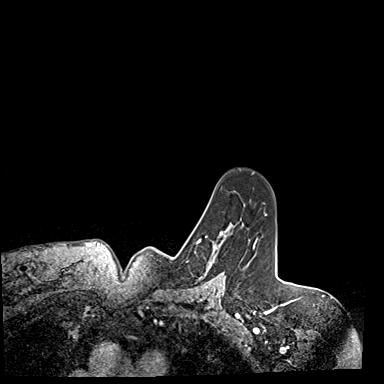
[im 160/160]
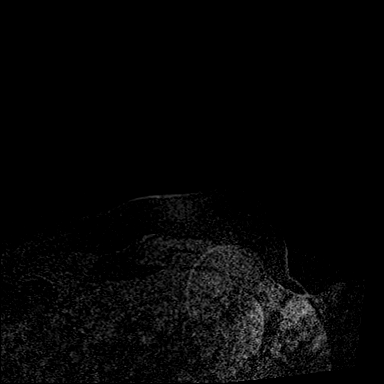

[Series 9: dynamic post 5 · axial · 1.3mm · 0.73mm/px · z∈[-106,+101]mm · 4 of 160 slices shown (2 of 2)]
[im 1/160]
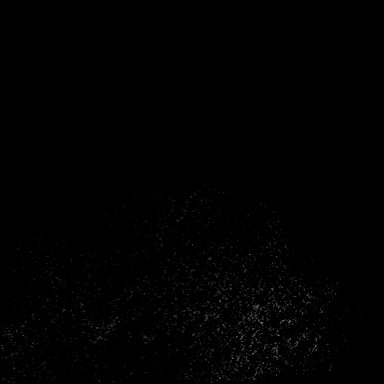
[im 54/160]
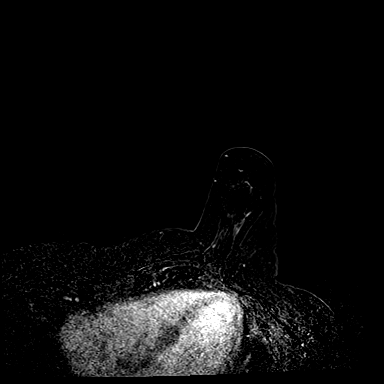
[im 107/160]
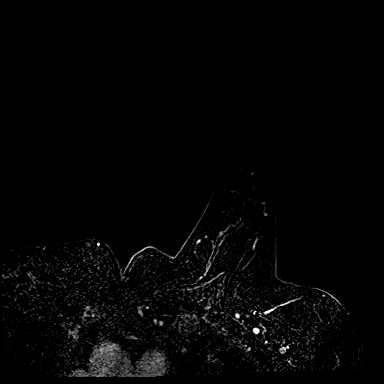
[im 160/160]
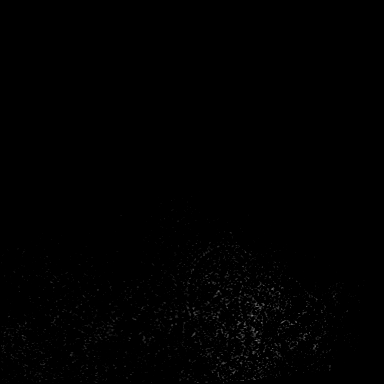

[32 of 48 positions shown; findings below may reference images not displayed]

FINDINGS: I met with the patient, and we discussed the procedure of MRI guided
biopsy, including risks, benefits, and alternatives. Specifically,
we discussed the risks of infection, bleeding, tissue injury, clip
migration, and inadequate sampling. Informed, written consent was
given. The usual time out protocol was performed immediately prior
to the procedure.

Using sterile technique, 1% Lidocaine, MRI guidance, and a 9 gauge
vacuum assisted device, biopsy was performed of the recently
demonstrated 8 mm area of clumped non mass enhancement in the upper
inner quadrant of the left breast using a lateral approach. At the
conclusion of the procedure, a a cylinder shaped tissue marker clip
was deployed into the biopsy cavity. Follow-up 2-view mammogram was
performed and dictated separately.
IMPRESSION: MRI guided biopsy of the recently demonstrated 8 mm area of clumped
non mass enhancement in the upper inner quadrant of the left breast.
No apparent complications.

ADDENDUM:
Pathology revealed USUAL DUCTAL HYPERPLASIA AND FIBROCYSTIC CHANGES
WITH APOCRINE METAPLASIA, PSEUDOANGIOMATOUS STROMAL HYPERPLASIA of
the LEFT breast, upper inner quadrant. This was found to be
concordant by Dr. BARI.

Pathology results were discussed with the patient by telephone. The
patient reported doing well after the biopsy with tenderness at the
site. Post biopsy instructions and care were reviewed and questions
were answered. The patient was encouraged to call The [REDACTED]

The patient has a recent diagnosis of left breast cancer and should
follow her outlined treatment plan.

Pathology results reported by BARI RN on [DATE].

*** End of Addendum ***
FINDINGS: I met with the patient, and we discussed the procedure of MRI guided
biopsy, including risks, benefits, and alternatives. Specifically,
we discussed the risks of infection, bleeding, tissue injury, clip
migration, and inadequate sampling. Informed, written consent was
given. The usual time out protocol was performed immediately prior
to the procedure.

Using sterile technique, 1% Lidocaine, MRI guidance, and a 9 gauge
vacuum assisted device, biopsy was performed of the recently
demonstrated 8 mm area of clumped non mass enhancement in the upper
inner quadrant of the left breast using a lateral approach. At the
conclusion of the procedure, a a cylinder shaped tissue marker clip
was deployed into the biopsy cavity. Follow-up 2-view mammogram was
performed and dictated separately.
IMPRESSION: MRI guided biopsy of the recently demonstrated 8 mm area of clumped
non mass enhancement in the upper inner quadrant of the left breast.
No apparent complications.

## 2020-02-13 MED ORDER — GADOBUTROL 1 MMOL/ML IV SOLN
7.0000 mL | Freq: Once | INTRAVENOUS | Status: AC | PRN
  Administered 2020-02-13: 7 mL via INTRAVENOUS

## 2020-02-16 ENCOUNTER — Other Ambulatory Visit: Payer: Self-pay | Admitting: Surgery

## 2020-02-16 ENCOUNTER — Encounter: Payer: Self-pay | Admitting: Hematology and Oncology

## 2020-02-16 DIAGNOSIS — Z170 Estrogen receptor positive status [ER+]: Secondary | ICD-10-CM

## 2020-02-16 DIAGNOSIS — D241 Benign neoplasm of right breast: Secondary | ICD-10-CM

## 2020-02-17 ENCOUNTER — Encounter: Payer: Self-pay | Admitting: *Deleted

## 2020-02-19 ENCOUNTER — Other Ambulatory Visit: Payer: Self-pay | Admitting: Surgery

## 2020-02-19 DIAGNOSIS — D241 Benign neoplasm of right breast: Secondary | ICD-10-CM

## 2020-02-19 DIAGNOSIS — Z170 Estrogen receptor positive status [ER+]: Secondary | ICD-10-CM

## 2020-02-23 ENCOUNTER — Encounter: Payer: Self-pay | Admitting: *Deleted

## 2020-02-26 ENCOUNTER — Encounter: Payer: Self-pay | Admitting: Hematology and Oncology

## 2020-02-27 ENCOUNTER — Encounter: Payer: Self-pay | Admitting: Hematology and Oncology

## 2020-02-29 NOTE — Progress Notes (Signed)
Patient Care Team: Maury Dus, MD as PCP - General (Family Medicine) Mauro Kaufmann, RN as Oncology Nurse Navigator Rockwell Germany, RN as Oncology Nurse Navigator Alphonsa Overall, MD as Consulting Physician (General Surgery) Nicholas Lose, MD as Consulting Physician (Hematology and Oncology) Kyung Rudd, MD as Consulting Physician (Radiation Oncology)  DIAGNOSIS:    ICD-10-CM   1. Malignant neoplasm of upper-outer quadrant of left breast in female, estrogen receptor positive ([place])  C50.412    Z17.0     SUMMARY OF ONCOLOGIC HISTORY: Oncology History  Malignant neoplasm of upper-outer quadrant of left breast in female, estrogen receptor positive ([place])  12/19/2019 Initial Diagnosis   Screening mammogram showed a possible mass and 2 asymmetries in the left breast. Diagnostic mammogram showed two masses 1.4cm apart in the left breast at the 2:30 position, 0.7cm and 1.1cm, and two likely cysts, a 0.5cm mass a the 3:00 position, and a 0.9cm mass at the 11:00 position. Biopsy showed IDC in both masses at the 2:30 position, grade 3, HER-2 equivocal by IHC (2+), negative by FISH, ER+ 80%, PR- 0%, KI67 90%.   12/19/2019 Oncotype testing   Oncotype DX score 43: Distant recurrence at 9 years 31%   12/24/2019 Cancer Staging   Staging form: Breast, AJCC 8th Edition - Clinical stage from 12/24/2019: Stage IB (cT1c, cN0, cM0, G3, ER+, PR-, HER2-) - Signed by Nicholas Lose, MD on 12/24/2019     CHIEF COMPLIANT: Follow-up of left breast cancer  INTERVAL HISTORY: Beverly Mcclain is a 59 y.o. with above-mentioned history of left breast cancer. Left breast biopsy on 01/20/20 showed fibrocystic changes and no evidence of malignancy at the 3 and 11 o'clock positions. Right breast biopsy on 01/30/20 showed papillary apocrine metaplasia in the upper inner quadrant. Left breast biopsy on 02/13/20 showed ductal hyperplasia in the upper inner quadrant. She presents to the clinic today for follow-up.  She is here  today to discuss her thoughts and wishes about chemotherapy and radiation.  Her surgery is being done on 03/16/2020.  ALLERGIES:  is allergic to ibuprofen, sulfa antibiotics, and tuberculin tests.  MEDICATIONS:  Current Outpatient Medications  Medication Sig Dispense Refill  . acetaminophen (TYLENOL) 500 MG tablet Take 500 mg by mouth every 6 (six) hours as needed for moderate pain.    Marland Kitchen ALPRAZolam (XANAX) 0.5 MG tablet Take 0.25 mg by mouth daily as needed for anxiety.    Marland Kitchen amphetamine-dextroamphetamine (ADDERALL) 30 MG tablet Take 7.5 mg by mouth daily.    . APPLE CIDER VINEGAR PO Take 400 mg by mouth daily.    Marland Kitchen ascorbic acid (VITAMIN C) 1000 MG tablet Take 2,000 mg by mouth daily.     Marland Kitchen b complex vitamins capsule Take 1 capsule by mouth daily.    . Cyanocobalamin (VITAMIN B-12) 5000 MCG SUBL Place 5,000 mcg under the tongue daily.     . diphenhydrAMINE (BENADRYL) 25 MG tablet Take 25 mg by mouth daily as needed for allergies.    Marland Kitchen HYDROcodone-acetaminophen (NORCO/VICODIN) 5-325 MG tablet Take 0.5 tablets by mouth 2 (two) times daily as needed for moderate pain.    . meloxicam (MOBIC) 15 MG tablet Take 15 mg by mouth daily.    . sertraline (ZOLOFT) 100 MG tablet Take 50 mg by mouth daily.     Marland Kitchen zinc gluconate 50 MG tablet Take 50 mg by mouth daily.     No current facility-administered medications for this visit.    PHYSICAL EXAMINATION: ECOG PERFORMANCE STATUS: 1 -  Symptomatic but completely ambulatory  Vitals:   03/01/20 0854  BP: 106/65  Pulse: 67  Resp: 18  Temp: (!) 97 F (36.1 C)  SpO2: 99%   Filed Weights   03/01/20 0854  Weight: 150 lb (68 kg)    LABORATORY DATA:  I have reviewed the data as listed CMP Latest Ref Rng & Units 12/24/2019  Glucose 70 - 99 mg/dL 92  BUN 6 - 20 mg/dL 14  Creatinine 0.44 - 1.00 mg/dL 0.80  Sodium 135 - 145 mmol/L 140  Potassium 3.5 - 5.1 mmol/L 4.1  Chloride 98 - 111 mmol/L 107  CO2 22 - 32 mmol/L 26  Calcium 8.9 - 10.3 mg/dL 10.0    Total Protein 6.5 - 8.1 g/dL 7.1  Total Bilirubin 0.3 - 1.2 mg/dL 0.3  Alkaline Phos 38 - 126 U/L 82  AST 15 - 41 U/L 15  ALT 0 - 44 U/L 12    Lab Results  Component Value Date   WBC 6.1 12/24/2019   HGB 13.4 12/24/2019   HCT 39.7 12/24/2019   MCV 89.2 12/24/2019   PLT 278 12/24/2019   NEUTROABS 3.2 12/24/2019    ASSESSMENT & PLAN:  Malignant neoplasm of upper-outer quadrant of left breast in female, estrogen receptor positive ([place]) 12/19/2019:Screening mammogram showed a possible mass and 2 asymmetries in the left breast. 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 T1cN 0 stage Ib clinical stage Oncotype DX: 43, risk of distant recurrence at 9 years: 31%: High risk CT CAP and bone scan: No metastatic disease Breast MRI 01/02/2020: 4.5 cm right breast mass at 12:00, non-mass enhancement 3.7 cm 12:00, 2 previously known malignancies 1.2 cm, 6 mm enhancement, 8 mm NME left breast (recommended biopsies)  Treatment plan: 1. Breast conserving surgerywith sentinel lymph node biopsyfollowed by 2. adjuvant chemotherapy with Taxotere and Cytoxan every 3 weeks x4 cycles 3. Adjuvant radiation therapy followed by 4. Adjuvant antiestrogen therapy --------------------------------------------------------------------------------------------------------------------------   Left breast biopsy on 01/20/20 showed fibrocystic changes and no evidence of malignancy at the 3 and 11 o'clock positions. Right breast biopsy on 01/30/20 showed papillary apocrine metaplasia in the upper inner quadrant. Left breast biopsy on 02/13/20 showed ductal hyperplasia in the upper inner quadrant.   Patient had time to think about chemotherapy and radiation and today she is here to inform me that she is not interested in  doing chemotherapy or radiation.  She believes that she has 2 bad teeth and taking care of the teeth (teeth extraction to be done this week) will lead to improvement in inflammation which then subsequently decreases the risk of breast cancer. She understands fully that distant metastases cannot be cured and therefore we recommend chemotherapy. She also understands that radiation will prevent local recurrence.  In spite of that she has made up her mind to not receive either chemo or radiation. Return to clinic in November to discuss the final pathology report. I informed Dr. Lucia Gaskins that she does not want to undergo port placement.  No orders of the defined types were placed in this encounter.  The patient has a good understanding of the overall plan. she agrees with it. she will call with any problems that may develop before the next visit here.  Total time spent: 30 mins including face to  face time and time spent for planning, charting and coordination of care  Nicholas Lose, MD 03/01/2020  I, Molly Dorshimer, am acting as scribe for Dr. Nicholas Lose.  I have reviewed the above documentation for accuracy and completeness, and I agree with the above.

## 2020-03-01 ENCOUNTER — Encounter: Payer: Self-pay | Admitting: *Deleted

## 2020-03-01 ENCOUNTER — Other Ambulatory Visit: Payer: Self-pay

## 2020-03-01 ENCOUNTER — Inpatient Hospital Stay: Payer: 59 | Attending: Hematology and Oncology | Admitting: Hematology and Oncology

## 2020-03-01 DIAGNOSIS — Z170 Estrogen receptor positive status [ER+]: Secondary | ICD-10-CM | POA: Diagnosis not present

## 2020-03-01 DIAGNOSIS — Z79899 Other long term (current) drug therapy: Secondary | ICD-10-CM | POA: Diagnosis not present

## 2020-03-01 DIAGNOSIS — Z7984 Long term (current) use of oral hypoglycemic drugs: Secondary | ICD-10-CM | POA: Diagnosis not present

## 2020-03-01 DIAGNOSIS — C50412 Malignant neoplasm of upper-outer quadrant of left female breast: Secondary | ICD-10-CM

## 2020-03-01 NOTE — Assessment & Plan Note (Signed)
12/19/2019:Screening mammogram showed a possible mass and 2 asymmetries in the left breast. 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 T1cN 0 stage Ib clinical stage Oncotype DX: 43, risk of distant recurrence at 9 years: 31%: High risk CT CAP and bone scan: No metastatic disease Breast MRI 01/02/2020: 4.5 cm right breast mass at 12:00, non-mass enhancement 3.7 cm 12:00, 2 previously known malignancies 1.2 cm, 6 mm enhancement, 8 mm NME left breast (recommended biopsies)  Treatment plan: 1. Breast conserving surgerywith sentinel lymph node biopsyfollowed by 2. adjuvant chemotherapy with Taxotere and Cytoxan every 3 weeks x4 cycles 3. Adjuvant radiation therapy followed by 4. Adjuvant antiestrogen therapy -------------------------------------------------------------------------------------------------------------------------- Delay in surgery because of multiple additional biopsies. Left breast biopsy on 01/20/20 showed fibrocystic changes and no evidence of malignancy at the 3 and 11 o'clock positions. Right breast biopsy on 01/30/20 showed papillary apocrine metaplasia in the upper inner quadrant. Left breast biopsy on 02/13/20 showed ductal hyperplasia in the upper inner quadrant.

## 2020-03-02 ENCOUNTER — Inpatient Hospital Stay (HOSPITAL_COMMUNITY): Admission: RE | Admit: 2020-03-02 | Payer: 59 | Source: Ambulatory Visit

## 2020-03-02 NOTE — Progress Notes (Signed)
Beverly Mcclain called Pre- OP Desk And said that she needs to cancel PAT appt that is 03/02/20 needs to be cancelled- "surgery has been moved." I called [facility] and spoke to Lillie who said yes that is true.  I asked if OR will be the 10/22 as [facility] has the same surgery scheduled for 10/19 and 10/26. Jackelyn Poling said that it may need to be moved off the 26, she did not schedule the 26th, she will reach out to [name] and see when she will be able to h ave surgery and then schedule it.  Jackelyn Poling will notify [facility] Scheduling and have 10/19 removed and possibly 10/26/2.

## 2020-03-05 ENCOUNTER — Other Ambulatory Visit (HOSPITAL_COMMUNITY): Payer: 59

## 2020-03-09 ENCOUNTER — Ambulatory Visit (HOSPITAL_COMMUNITY): Admission: RE | Admit: 2020-03-09 | Payer: 59 | Source: Home / Self Care | Admitting: Surgery

## 2020-03-09 ENCOUNTER — Ambulatory Visit (HOSPITAL_COMMUNITY): Payer: 59

## 2020-03-09 SURGERY — BREAST LUMPECTOMY WITH RADIOACTIVE SEED AND SENTINEL LYMPH NODE BIOPSY
Anesthesia: General | Site: Breast | Laterality: Bilateral

## 2020-03-12 ENCOUNTER — Other Ambulatory Visit (HOSPITAL_COMMUNITY): Payer: 59

## 2020-03-15 ENCOUNTER — Encounter: Payer: Self-pay | Admitting: *Deleted

## 2020-03-16 ENCOUNTER — Encounter: Payer: Self-pay | Admitting: Hematology and Oncology

## 2020-03-16 ENCOUNTER — Ambulatory Visit (HOSPITAL_COMMUNITY): Payer: 59

## 2020-03-17 ENCOUNTER — Telehealth: Payer: Self-pay | Admitting: Hematology and Oncology

## 2020-03-17 NOTE — Telephone Encounter (Signed)
Scheduled 12/10 (VG not in office on 12/9)- per 10/25 scheduling message. Spoke with patient she is aware.

## 2020-03-18 ENCOUNTER — Telehealth: Payer: Self-pay

## 2020-03-18 NOTE — Telephone Encounter (Signed)
Pt called and LVM stating she has shingles and is questioning if she should follow through with surgery. This LPN attempted to return call, no ans. LVM for her to return call.

## 2020-03-23 ENCOUNTER — Ambulatory Visit: Payer: 59 | Admitting: Hematology and Oncology

## 2020-04-07 ENCOUNTER — Encounter (HOSPITAL_COMMUNITY): Payer: Self-pay | Admitting: *Deleted

## 2020-04-07 ENCOUNTER — Emergency Department (HOSPITAL_COMMUNITY)
Admission: EM | Admit: 2020-04-07 | Discharge: 2020-04-07 | Disposition: A | Discharge status: Home / Self Care | Payer: 59 | Attending: Emergency Medicine | Admitting: Emergency Medicine

## 2020-04-07 ENCOUNTER — Other Ambulatory Visit: Payer: Self-pay

## 2020-04-07 DIAGNOSIS — R1031 Right lower quadrant pain: Secondary | ICD-10-CM | POA: Insufficient documentation

## 2020-04-07 DIAGNOSIS — R109 Unspecified abdominal pain: Secondary | ICD-10-CM

## 2020-04-07 DIAGNOSIS — Z853 Personal history of malignant neoplasm of breast: Secondary | ICD-10-CM | POA: Diagnosis not present

## 2020-04-07 DIAGNOSIS — Z87891 Personal history of nicotine dependence: Secondary | ICD-10-CM | POA: Insufficient documentation

## 2020-04-07 HISTORY — DX: Malignant (primary) neoplasm, unspecified: C80.1

## 2020-04-07 LAB — COMPREHENSIVE METABOLIC PANEL
ALT: 14 U/L (ref 0–44)
AST: 20 U/L (ref 15–41)
Albumin: 4.6 g/dL (ref 3.5–5.0)
Alkaline Phosphatase: 65 U/L (ref 38–126)
Anion gap: 7 (ref 5–15)
BUN: 18 mg/dL (ref 6–20)
CO2: 27 mmol/L (ref 22–32)
Calcium: 9.4 mg/dL (ref 8.9–10.3)
Chloride: 106 mmol/L (ref 98–111)
Creatinine, Ser: 0.77 mg/dL (ref 0.44–1.00)
GFR, Estimated: >60 mL/min (ref >60)
Glucose, Bld: 91 mg/dL (ref 70–99)
Potassium: 4.2 mmol/L (ref 3.5–5.1)
Sodium: 140 mmol/L (ref 135–145)
Total Bilirubin: 0.5 mg/dL (ref 0.3–1.2)
Total Protein: 7 g/dL (ref 6.5–8.1)

## 2020-04-07 LAB — URINALYSIS, ROUTINE W REFLEX MICROSCOPIC
Bilirubin Urine: NEGATIVE
Glucose, UA: NEGATIVE mg/dL
Hgb urine dipstick: NEGATIVE
Ketones, ur: NEGATIVE mg/dL
Leukocytes,Ua: NEGATIVE
Nitrite: NEGATIVE
Protein, ur: NEGATIVE mg/dL
Specific Gravity, Urine: 1.016 (ref 1.005–1.030)
pH: 6 (ref 5.0–8.0)

## 2020-04-07 LAB — CBC
HCT: 40.5 % (ref 36.0–46.0)
Hemoglobin: 13.5 g/dL (ref 12.0–15.0)
MCH: 31.2 pg (ref 26.0–34.0)
MCHC: 33.3 g/dL (ref 30.0–36.0)
MCV: 93.5 fL (ref 80.0–100.0)
Platelets: 317 10*3/uL (ref 150–400)
RBC: 4.33 MIL/uL (ref 3.87–5.11)
RDW: 12.2 % (ref 11.5–15.5)
WBC: 8 10*3/uL (ref 4.0–10.5)
nRBC: 0 % (ref 0.0–0.2)

## 2020-04-07 LAB — LIPASE, BLOOD: Lipase: 45 U/L (ref 11–51)

## 2020-04-07 NOTE — ED Triage Notes (Signed)
Pt complains of right lower quadrant pain x 6 weeks. Pain is relieved after having bowel movement . She has hx of arthritis in right hip. She is currently taking clindamycin for failed root canal.

## 2020-04-07 NOTE — ED Notes (Signed)
Patient unwilling to wait for discharge paperwork. Seen walking out of the ED in no distress.

## 2020-04-07 NOTE — ED Provider Notes (Signed)
[facility] DEPT Provider Note   CSN: [id number] Arrival date & time: 04/07/20  1440     History Chief Complaint  Patient presents with  . Abdominal Pain    Beverly Mcclain is a 59 y.o. female.   Abdominal Pain Pain location:  RLQ Pain quality: aching   Pain radiates to:  Does not radiate Pain severity:  Moderate Onset quality:  Gradual Duration:  6 weeks Timing:  Intermittent Progression:  Waxing and waning Chronicity:  New Context: not diet changes, not recent illness, not sick contacts and not suspicious food intake   Relieved by: BM. Worsened by:  Nothing Ineffective treatments:  None tried Associated symptoms: no anorexia, no chest pain, no chills, no cough, no diarrhea, no dysuria, no fever, no nausea, no shortness of breath and no vomiting        Past Medical History:  Diagnosis Date  . Anxiety   . Cancer [facility])    breast    Patient Active Problem List   Diagnosis Date Noted  . Malignant neoplasm of upper-outer quadrant of left breast in female, estrogen receptor positive ([place]) 12/19/2019    Past Surgical History:  Procedure Laterality Date  . CESAREAN SECTION    . polyp removal       OB History   No obstetric history on file.     No family history on file.  Social History   Tobacco Use  . Smoking status: Former Smoker    Types: Cigarettes    Quit date: 12/23/1988    Years since quitting: 31.3  . Smokeless tobacco: Never Used  Substance Use Topics  . Alcohol use: Yes  . Drug use: Yes    Types: Marijuana    Home Medications Prior to Admission medications   Medication Sig Start Date End Date Taking? Authorizing Provider  acetaminophen (TYLENOL) 500 MG tablet Take 500 mg by mouth every 6 (six) hours as needed for moderate pain.    [provider]  ALPRAZolam Duanne Moron) 0.5 MG tablet Take 0.25 mg by mouth daily as needed for anxiety.    [provider]  amphetamine-dextroamphetamine  (ADDERALL) 30 MG tablet Take 7.5 mg by mouth daily.    [provider]  APPLE CIDER VINEGAR PO Take 400 mg by mouth daily.    [provider]  ascorbic acid (VITAMIN C) 1000 MG tablet Take 2,000 mg by mouth daily.     [provider]  b complex vitamins capsule Take 1 capsule by mouth daily.    [provider]  Cyanocobalamin (VITAMIN B-12) 5000 MCG SUBL Place 5,000 mcg under the tongue daily.     [provider]  diphenhydrAMINE (BENADRYL) 25 MG tablet Take 25 mg by mouth daily as needed for allergies.    [provider]  HYDROcodone-acetaminophen (NORCO/VICODIN) 5-325 MG tablet Take 0.5 tablets by mouth 2 (two) times daily as needed for moderate pain.    [provider]  meloxicam (MOBIC) 15 MG tablet Take 15 mg by mouth daily.    [provider]  sertraline (ZOLOFT) 100 MG tablet Take 50 mg by mouth daily.  10/17/19   [provider]  zinc gluconate 50 MG tablet Take 50 mg by mouth daily.    [provider]    Allergies    Ibuprofen, Sulfa antibiotics, and Tuberculin tests  Review of Systems   Review of Systems  Constitutional: Negative for chills and fever.  HENT: Negative for congestion and rhinorrhea.  Respiratory: Negative for cough and shortness of breath.   Cardiovascular: Negative for chest pain and palpitations.  Gastrointestinal: Positive for abdominal pain. Negative for anorexia, diarrhea, nausea and vomiting.  Genitourinary: Negative for difficulty urinating and dysuria.  Musculoskeletal: Negative for arthralgias and back pain.  Skin: Negative for rash and wound.  Neurological: Negative for light-headedness and headaches.    Physical Exam Updated Vital Signs BP 106/73 (BP Location: Right Arm)   Pulse (!) 58   Temp 98.1 F (36.7 C) (Oral)   Resp 19   SpO2 99%   Physical Exam Vitals and nursing note reviewed. Exam conducted with a chaperone present.  Constitutional:       General: She is not in acute distress.    Appearance: Normal appearance.  HENT:     Head: Normocephalic and atraumatic.     Nose: No rhinorrhea.  Eyes:     General:        Right eye: No discharge.        Left eye: No discharge.     Conjunctiva/sclera: Conjunctivae normal.  Cardiovascular:     Rate and Rhythm: Normal rate and regular rhythm.  Pulmonary:     Effort: Pulmonary effort is normal. No respiratory distress.     Breath sounds: No stridor.  Abdominal:     General: Abdomen is flat. There is no distension.     Palpations: Abdomen is soft.     Tenderness: There is no abdominal tenderness. There is no right CVA tenderness, guarding or rebound. Negative signs include Murphy's sign, Rovsing's sign, McBurney's sign and psoas sign.  Musculoskeletal:        General: No tenderness or signs of injury.  Skin:    General: Skin is warm and dry.  Neurological:     General: No focal deficit present.     Mental Status: She is alert. Mental status is at baseline.     Motor: No weakness.  Psychiatric:        Mood and Affect: Mood normal.        Behavior: Behavior normal.     ED Results / Procedures / Treatments   Labs (all labs ordered are listed, but only abnormal results are displayed) Labs Reviewed  LIPASE, BLOOD  COMPREHENSIVE METABOLIC PANEL  CBC  URINALYSIS, ROUTINE W REFLEX MICROSCOPIC    EKG None  Radiology No results found.  Procedures Procedures (including critical care time)  Medications Ordered in ED Medications - No data to display  ED Course  I have reviewed the triage vital signs and the nursing notes.  Pertinent labs & imaging results that were available during my care of the patient were reviewed by me and considered in my medical decision making (see chart for details).    MDM Rules/Calculators/A&P                          59 year old female sent to our department by her primary care provider for evaluation for appendicitis.  She is right lower  quadrant pain has had such for the last several weeks.  She is afebrile her vital signs are stable she is no peritoneal signs on exam she has no tenderness, she is overall well-appearing tolerating p.o. and solid nutrition.  The intermittent nature and time course is not consistent with acute appendicitis towards the exam.  She has no leukocytosis.  Metabolic panel is unremarkable.  Lipase is negative.  Still waiting on urinalysis, once resulted the patient can  be discharged with outpatient follow-up.  Urinalysis is unremarkable for any signs of acute infectious processes.  She is told to follow-up with her primary care provider if the pain continues.  Strict return precautions are provided.  Final Clinical Impression(s) / ED Diagnoses Final diagnoses:  Undifferentiated abdominal pain    Rx / DC Orders ED Discharge Orders    None       Breck Coons, MD 04/07/20 [phone]

## 2020-04-14 ENCOUNTER — Inpatient Hospital Stay (HOSPITAL_COMMUNITY): Admission: RE | Admit: 2020-04-14 | Payer: 59 | Source: Ambulatory Visit

## 2020-04-19 ENCOUNTER — Other Ambulatory Visit (HOSPITAL_COMMUNITY)
Admission: RE | Admit: 2020-04-19 | Discharge: 2020-04-19 | Disposition: A | Discharge status: Home / Self Care | Payer: 59 | Source: Ambulatory Visit | Attending: Surgery | Admitting: Surgery

## 2020-04-19 DIAGNOSIS — Z01812 Encounter for preprocedural laboratory examination: Secondary | ICD-10-CM | POA: Insufficient documentation

## 2020-04-19 DIAGNOSIS — Z20822 Contact with and (suspected) exposure to covid-19: Secondary | ICD-10-CM | POA: Diagnosis not present

## 2020-04-19 LAB — SARS CORONAVIRUS 2 (TAT 6-24 HRS): SARS Coronavirus 2: NEGATIVE

## 2020-04-19 NOTE — Pre-Procedure Instructions (Signed)
Your procedure is scheduled on Thurs., Dec. 2, 2021 from 9:00AM-11:30AM  Report to [facility] Entrance "A" at 7:00AM  Call this number if you have problems the morning of surgery:  [phone]   Remember:  Do not eat after midnight on Dec. 1st  You may drink clear liquids until 3 hours (6:00AM) prior to surgery time .  Clear liquids allowed are:  Water, Non-Citric Juice (no pulp), Black Coffee Only (no dairy or creamer), Clear Tea (no dairy or creamer), Carbonated Beverages, Gatorade/Powerade, Plain Jell-O, Plain Popsicles.    Take these medicines the morning of surgery with A SIP OF WATER: Sertraline (ZOLOFT)       If Needed: Acetaminophen (TYLENOL)  ALPRAZolam (XANAX) DiphenhydrAMINE (BENADRYL)  HYDROcodone-acetaminophen (NORCO/VICODIN)   As of today,  STOP taking all Aspirin (unless instructed by your doctor) and Other Aspirin containing products, Vitamins, Fish oils, and Herbal medications. Also stop all NSAIDS i.e. Advil, Ibuprofen, Motrin, Aleve, Anaprox, Meloxicam (MOBIC), Naproxen, BC, Goody Powders, and all Supplements.   No Smoking of any kind, Tobacco/Vaping, or Alcohol products 24 hours prior to your procedure. If you use a Cpap at night, you may bring all equipment for your overnight stay.   Special instructions:  [place]- Preparing For Surgery  Before surgery, you can play an important role. Because skin is not sterile, your skin needs to be as free of germs as possible. You can reduce the number of germs on your skin by washing with CHG (chlorahexidine gluconate) Soap before surgery.  CHG is an antiseptic cleaner which kills germs and bonds with the skin to continue killing germs even after washing.    Please do not use if you have an allergy to CHG or antibacterial soaps. If your skin becomes reddened/irritated stop using the CHG.  Do not shave (including legs and underarms) for at least 48 hours prior to first CHG shower. It is OK to shave your  face.  Please follow these instructions carefully.   1. Shower the NIGHT BEFORE SURGERY and the MORNING OF SURGERY with CHG.   2. If you chose to wash your hair, wash your hair first as usual with your normal shampoo.  3. After you shampoo, rinse your hair and body thoroughly to remove the shampoo.  4. Use CHG as you would any other liquid soap. You can apply CHG directly to the skin and wash gently with a scrungie or a clean washcloth.   5. Apply the CHG Soap to your body ONLY FROM THE NECK DOWN.  Do not use on open wounds or open sores. Avoid contact with your eyes, ears, mouth and genitals (private parts). Wash Face and genitals (private parts)  with your normal soap.  6. Wash thoroughly, paying special attention to the area where your surgery will be performed.  7. Thoroughly rinse your body with warm water from the neck down.  8. DO NOT shower/wash with your normal soap after using and rinsing off the CHG Soap.  9. Pat yourself dry with a CLEAN TOWEL.  10. Wear CLEAN PAJAMAS to bed the night before surgery, wear comfortable clothes the morning of surgery  11. Place CLEAN SHEETS on your bed the night of your first shower and DO NOT SLEEP WITH PETS.   Day of Surgery:             Remember to brush your teeth WITH YOUR REGULAR TOOTHPASTE.  Do not wear jewelry, make-up or nail polish.  Do not wear lotions, powders,  or perfumes, or deodorant.  Do not shave 48 hours prior to surgery.   Do not bring valuables to the hospital.  [facility] is not responsible for any belongings or valuables.  Contacts, dentures or bridgework may not be worn into surgery.  For patients admitted to the hospital, discharge time will be determined by your treatment team.  Patients discharged the day of surgery will not be allowed to drive home, and someone age 67 and over needs to stay with them for 24 hours.  Please wear clean clothes to the hospital/surgery center.    Please read over the  following fact sheets that you were given.

## 2020-04-20 ENCOUNTER — Other Ambulatory Visit: Payer: Self-pay

## 2020-04-20 ENCOUNTER — Encounter (HOSPITAL_COMMUNITY): Payer: Self-pay

## 2020-04-20 ENCOUNTER — Other Ambulatory Visit: Payer: Self-pay | Admitting: Surgery

## 2020-04-20 ENCOUNTER — Encounter (HOSPITAL_COMMUNITY)
Admission: RE | Admit: 2020-04-20 | Discharge: 2020-04-20 | Disposition: A | Discharge status: Home / Self Care | Payer: 59 | Source: Ambulatory Visit | Attending: Surgery | Admitting: Surgery

## 2020-04-20 DIAGNOSIS — Z01812 Encounter for preprocedural laboratory examination: Secondary | ICD-10-CM | POA: Diagnosis not present

## 2020-04-20 DIAGNOSIS — Z20822 Contact with and (suspected) exposure to covid-19: Secondary | ICD-10-CM | POA: Diagnosis not present

## 2020-04-20 HISTORY — DX: Carpal tunnel syndrome, unspecified upper limb: G56.00

## 2020-04-20 HISTORY — DX: Personal history of urinary calculi: Z87.442

## 2020-04-20 HISTORY — DX: Unspecified osteoarthritis, unspecified site: M19.90

## 2020-04-20 LAB — CBC
HCT: 41.1 % (ref 36.0–46.0)
Hemoglobin: 13.3 g/dL (ref 12.0–15.0)
MCH: 30.3 pg (ref 26.0–34.0)
MCHC: 32.4 g/dL (ref 30.0–36.0)
MCV: 93.6 fL (ref 80.0–100.0)
Platelets: 301 10*3/uL (ref 150–400)
RBC: 4.39 MIL/uL (ref 3.87–5.11)
RDW: 12.1 % (ref 11.5–15.5)
WBC: 6.4 10*3/uL (ref 4.0–10.5)
nRBC: 0 % (ref 0.0–0.2)

## 2020-04-20 NOTE — H&P (Signed)
Beverly Mcclain  Location: [facility] Surgery Patient #: [id number] DOB: 09-08-1960 Undefined / Language: Beverly Mcclain / Race: White Female  History of Present Illness   The patient is a 59 year old female who presents with a complaint of breast cancer.  The PCP isn Dr. Lenard Mcclain  The patient is at the Breast [facility] - Oncology is Drs. Beverly Mcclain and Beverly Mcclain Her husband, Beverly Mcclain, is with her.   I originally saw her in the Breast [facility] clinic in August 2021, but for a host of reasons, she has been slow to schedule her surgery.  One problem is her lack of money.  Another reason is her distrust of medicine and physicians (though she never told me what exactly caused this distrust).  Thirdly, she seems to want to do the minimal to get by and I am not sure that money is the only issue.  It sounds like she has been without insurance until March 2021. So she has not had a mammogram in 10+ years. She did not really feel anything in her left breast, she just went for mammograms. She has no fam hx of breast cancer. She is not on hormone tx. Her LMP was about 5 years ago.  [facility] - multiple left breast masses. Will need MRI and possible additional left breast biopsies Biopsy: 12/15/2019 left breast biopsy x 2 ([id number]) - 1. IDC, 2. IDC - Grade - 3, ER - 80%, PR 0%, Ki67 -90%, Her2Neu - Neg Family history of breast or ovarian cancer: None On hormone therapy: No  I discussed the options for breast cancer treatment with the patient. The patient is at the [facility], which includes medical oncology and radiation oncology. I discussed the surgical options of lumpectomy vs. mastectomy. If mastectomy, there is the possibility of reconstruction. I discussed the options of lymph node biopsy. The treatment plan depends on the pathologic staging of the tumor and the patient's personal wishes. The risks of  surgery include, but are not limited to, bleeding, infection, the need for further surgery, and nerve injury. The patient has been given literature on the treatment of breast cancer. She asked about the Mammosite (a friend had this), but I did not encourage it for her case.  She has a complex mammogram and will need an MRI before a final decision about surgery. There were areas in 3 o'clock and 11 o'clock, which were not biopsied. She has some odd complaints - so Dr. Lindi Mcclain is going to do a met workup on her.  Plan: 1. MRI, 2. CT scan and bone scan, 3. Oncotype on core biopsy, 4. Left breast lumpectomy (2 seeds (?)) with left axillary SLNBx (depending on MRI)  Addendum Note(Beverly Kielty H. Beverly Gaskins MD; 02/04/2020 11:25 AM)  Beverly Mcclain has a complex picture. I spent some time going over her films with Dr. Kristopher Mcclain at the [facility] As of now, she has two cancers (bxed 7/26) in the UOQ of the left breast. She as a papilloma (bxed 8/31) of the left breast that is about 1.5 cm anterior to the cancers. The cancer/papilloma is amendable to a lumpectomy with two seeds.  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 There is a chance that the UIQ area on MRI was biopsied on 8/31 - but it is not confirmend]  Unfortunately, there is still one area in the upper inner quadrant of the left breast that needs an MRI biopsy. This is scheduled  for 9/24. If the biopsy shows cancer - I would recommend a mastectomy.  She also had a biopsy of her right breast (9/10) which shows a papilloma. This can be removed at the same time as the left breast surgery.  I talked to her on the phone about chemotherapy - but she is hesitant. She's had some hip pain, which she is worried about the MRI. She has been on Beverly Mcclain for 20+ years, she is trying to stop this. She has an "event" in February, 2022. Her husband's daughter is getting married in [place] Mc[place]. All this is playing a role in her  decision.  Past Medical History: 1. History of kidney stones 2. Appendectomy - 2002 - Beverly Mcclain 3. She has not had the Covid vaccine - I encouraged her to get it. 4. Carpel tunnel symptoms right arm/writs - not actively seeing anyone 5. Right ear tinnitus 3. right hip pain x 2 years 7. ADD and anxiety treated by Dr. Alyson Mcclain 8. Beverly Mcclain removed a uterine polyp in 2000's 9. Colonoscopy around 2019 - she did this out of town  Social History: Husband Beverly Mcclain. Children: Beverly Mcclain 5 yo and Beverly Mcclain 57 yo. 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 Cesarean Section - 1  Colon Polyp Removal - Colonoscopy   Diagnostic Studies History Beverly Slipper, RN; 12/24/2019 8:07 AM) Colonoscopy  1-5 years ago Mammogram  within last year Pap Smear  1-5 years ago  Medication History Beverly Slipper, RN; 12/24/2019 8:07 AM) Medications Reconciled  Social History Beverly Slipper, RN; 12/24/2019 8:07 AM) Alcohol use  Moderate alcohol use. Caffeine use  Tea. Illicit drug use  Remotely quit drug use. Tobacco use  Former smoker.  Family History Beverly Slipper, RN; 12/24/2019 8:07 AM) Alcohol Abuse  Brother, Father, Mother, Sister. Arthritis  Sister. Depression  Brother, Family Members In General, Mother, Sister. Heart Disease  Family Members In General, Father. Respiratory Condition  Mother.  Pregnancy / Birth History Beverly Slipper, RN; 12/24/2019 8:07 AM) Age at menarche  59 years. Age of menopause  51-55 Contraceptive History  Oral contraceptives. Gravida  3 Length (months) of breastfeeding  3-6 Maternal age  70-20 Para  2  Other Problems Beverly Slipper, RN; 12/24/2019 8:07 AM) Anxiety Disorder  Arthritis  Bladder Problems  Gastroesophageal Reflux Disease     Review of Systems Beverly Slipper RN; 12/24/2019 8:07 AM) General  Present- Fatigue. Not Present- Appetite Loss, Chills, Fever, Night Sweats, Weight Gain and Weight Loss. Skin Present- Change in Wart/Mole and Hives. Not Present- Dryness, Jaundice, New Lesions, Non-Healing Wounds, Rash and Ulcer. HEENT Present- Ringing in the Ears and Seasonal Allergies. Not Present- Earache, Hearing Loss, Hoarseness, Nose Bleed, Oral Ulcers, Sinus Pain, Sore Throat, Visual Disturbances, Wears glasses/contact lenses and Yellow Eyes. Respiratory Present- Chronic Cough and Snoring. Not Present- Bloody sputum, Difficulty Breathing and Wheezing. Breast Present- Nipple Discharge. Not Present- Breast Mass, Breast Pain and Skin Changes. Cardiovascular Present- Palpitations and Rapid Heart Rate. Not Present- Chest Pain, Difficulty Breathing Lying Down, Leg Cramps, Shortness of Breath and Swelling of Extremities. Gastrointestinal Present- Abdominal Pain and Constipation. Not Present- Bloating, Bloody Stool, Change in Bowel Habits, Chronic diarrhea, Difficulty Swallowing, Excessive gas, Gets full quickly at meals, Hemorrhoids, Indigestion, Nausea, Rectal Pain and Vomiting. Female Genitourinary Present- Nocturia. Not Present- Frequency, Painful Urination, Pelvic Pain and Urgency. Musculoskeletal Not Present- Back Pain, Joint Pain, Joint Stiffness, Muscle Pain, Muscle Weakness  and Swelling of Extremities. Neurological Present- Decreased Memory, Headaches, Numbness, Tingling and Weakness. Not Present- Fainting, Seizures, Tremor and Trouble walking. Psychiatric Present- Anxiety and Depression. Not Present- Bipolar, Change in Sleep Pattern, Fearful and Frequent crying. Endocrine Present- Hot flashes. Not Present- Cold Intolerance, Excessive Hunger, Hair Changes, Heat Intolerance and New Diabetes. Hematology Not Present- Blood Thinners, Easy Bruising, Excessive bleeding, Gland problems, HIV and Persistent Infections.   Physical Exam  General: Long haired obese WF who isalert and generally healthy  appearing. HEENT: Normal. Pupils equal.  Neck: Supple. No mass. No thyroid mass.  Lymph Nodes: No supraclavicular or cervical nodes.  Lungs: Clear to auscultation and symmetric breath sounds. Heart: RRR. No murmur or rub.  Breasts: Right - pendulous, no mass. She pointed to an area of concern at 4 o'clock, but I could not feel much Left - bruising above the nipple and at 5 o'clock  Abdomen: Soft. No mass. No tenderness. No hernia. Normal bowel sounds.  Rectal: Not done.  Extremities: Good strength and ROM in upper and lower extremities.  Neurologic: Grossly intact to motor and sensory function. Psychiatric: Has normal mood and affect. Behavior is normal.    Assessment & Plan  1.  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 IDC, 2. IDC - Grade - 3, ER - 80%, PR 0%, Ki67 -90%, Her2Neu - Neg  Oncology - [place] and Beverly Mcclain   Plan:  1. MRI,   Addendum Note(Anelle Parlow H. Beverly Gaskins MD; 01/02/2020 4:00 PM)   Breast MRI - 01/02/2020 - IMPRESSION:   1. There is a 4.5 mm mass in the anterior right breast at 12 o'clock on series 8, image 47 which is indeterminate.   2. There is clumped non mass enhancement spanning 3.7 cm at 12 o'clock in the right breast at a mid to posterior depth on 14, image 43, image which is indeterminate.   3. The larger of the 2 known malignancies in the lower outer left breast measures 12 mm.   4. The smaller more posteriorly located malignancy is seen on series 8, image 58 with 6 mm of enhancement adjacent to a biopsy clip.   5. 8 mm lateral to the larger of the known 2 malignancies is a small mass which could be an intramammary lymph node or a small satellite lesion.   6. 8 mm of clumped non mass enhancement in the superior slightly medial left breast on series 14, image 43.  RECOMMENDATION:   1. Recommend ultrasound-guided biopsy of the 11 o'clock left breast mass  seen at diagnostic mammography. - biopsy on 02/13/2020 - ductal hyperplasia, fibrocystic changes with apocrine metaplasia, pseudoangiomatous stromal hyperplasia   2. Recommend ultrasound-guided biopsy of the left breast 3 o'clock mass seen at diagnostic mammography. - biopsy on 01/20/2020 - fibrocystic change, intraductal papilloma (concordant with excision recommended per Dr. Jeanmarie Plant)   3. Recommend MRI guided biopsy of the 4.5 mm mass at 12 o'clock in the anterior right breast on series 8, image 47. - Biopsy on 01/30/2020 - ductal papilloma with hyperplasia, papillary apocrine metaplasia, and fibrocystic change   4. Recommend biopsy of the non mass enhancement at 12 o'clock in the right breast at a mid to posterior depth on series 14, image 43. biopsy on 01/30/2020 - papillary apocrine metaplasia   5. Recommend biopsy of the clumped non mass enhancement in the superior slightly medial left breast on series 14, image 43. -  biopsy on 01/20/2020 - fibrocystic change (concordant per Dr. Jeanmarie Plant)   6. The small mass 8 mm lateral to the larger of the 2 known malignancies could be an intramammary lymph node or small satellite lesion. This should be removed at surgery.  2. CT scan and bone scan,   Bone scan - 01/05/2020 - negative   CT chest and abdomen - 01/05/2020 - No evidence of metastatic disease within the chest, abdomen, or pelvis.    Small benign renal angiomyolipomas incidentally noted.  3. Oncotype on core biopsy,   Addendum Note(Yuvan Medinger H. Beverly Gaskins MD; 01/10/2020 1:49 PM)   Ms Quin's oncotype is 85 on core. She saw Dr. Lindi Mcclain yesterday to discuss results and tx options.   At this time, she has decided against any chemotx.  4. Left breast lumpectomy (2 seeds (?)) with left axillary SLNBx     Addendum Note(Nieshia Larmon H. Beverly Gaskins MD; 04/20/2020 5:13 PM)    I spoke to [facility] on the phone for at least 20 minutes.    She is thinking about doing nothing.    I told her at least I would excise the breast cancer.  She does not want to do the lymph nodes biopsy, but I told her that this could provide important information. I went over the potential of positive margins with her.  And that this could require further surgery.  (She understands that I am retiring at the end of the year and this may need to be managed by a partner)    At this time, she does not want to have radiation tx or chemo tx.  She understands that this is not standard treatment and may put her at risk for recurrent cancer in the future.    She said money (or lack of money) is a big issue. I asked her about applying for Medicaid.  I also talked to her about talking to [facility] or Varney Biles about possible financial support.    For now - we will place two seeds in her left breast to remove the two cancers and do the left axillary SLNBx (though she may back out of the lymph node biopsy).  2.  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  Ductal papilloma of the right breast at about 12 o'clock  She wants to leave this alone for now  4.  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  RIGHT HIP PAIN  x 2 years 6.  She is having some teeth trouble and is seeing an endodontist on 02/26/2020. 7. History of kidney stones 8. She has not had the Covid vaccine - I encouraged her to get it. 9. Carpel tunnel symptoms right arm/writs - not actively seeing anyone 10. ADD and anxiety treated by Dr. Osborn Coho, MD, [facility] Surgery Office phone:  [phone]

## 2020-04-20 NOTE — Progress Notes (Signed)
PCP - Maury Dus Cardiologist - denies  PPM/ICD - denies   Chest x-ray - n/a EKG - n/a Stress Test - denies ECHO - denies Cardiac Cath - denies  Sleep Study - denies   Patient instructed to hold all Mobic, Aspirin, NSAID's, herbal medications, fish oil and vitamins 7 days prior to surgery.   ERAS Protcol -yes   COVID TEST- 04/19/2020 (negative)   Anesthesia review: no  Patient denies shortness of breath, fever, cough and chest pain at PAT appointment   All instructions explained to the patient, with a verbal understanding of the material. Patient agrees to go over the instructions while at home for a better understanding. Patient also instructed to self quarantine after being tested for COVID-19. The opportunity to ask questions was provided.

## 2020-04-21 ENCOUNTER — Ambulatory Visit
Admission: RE | Admit: 2020-04-21 | Discharge: 2020-04-21 | Disposition: A | Discharge status: Home / Self Care | Payer: 59 | Source: Ambulatory Visit | Attending: Surgery | Admitting: Surgery

## 2020-04-21 ENCOUNTER — Other Ambulatory Visit: Payer: Self-pay | Admitting: Surgery

## 2020-04-21 ENCOUNTER — Telehealth: Payer: Self-pay | Admitting: Licensed Clinical Social Worker

## 2020-04-21 DIAGNOSIS — C50912 Malignant neoplasm of unspecified site of left female breast: Secondary | ICD-10-CM

## 2020-04-21 DIAGNOSIS — Z170 Estrogen receptor positive status [ER+]: Secondary | ICD-10-CM

## 2020-04-21 DIAGNOSIS — D241 Benign neoplasm of right breast: Secondary | ICD-10-CM

## 2020-04-21 IMAGING — US US PLC BREAST LOC DEV EA ADD LESION INC US GUIDE*L*
1 series · 4 of 4 positions shown · non-contrast
Comparison: Previous exam(s).

CLINICAL DATA: Patient for preoperative localization prior to left
lumpectomy, 2 sites both at the 2:30 o'clock position.

EXAM:
ULTRASOUND GUIDED RADIOACTIVE SEED LOCALIZATION OF THE LEFT BREAST

[Series 1: us plc breast loc dev ea add lesion inc us guide*l · 0.07mm/px · 4 of 4 slices shown]
[im 1/4]
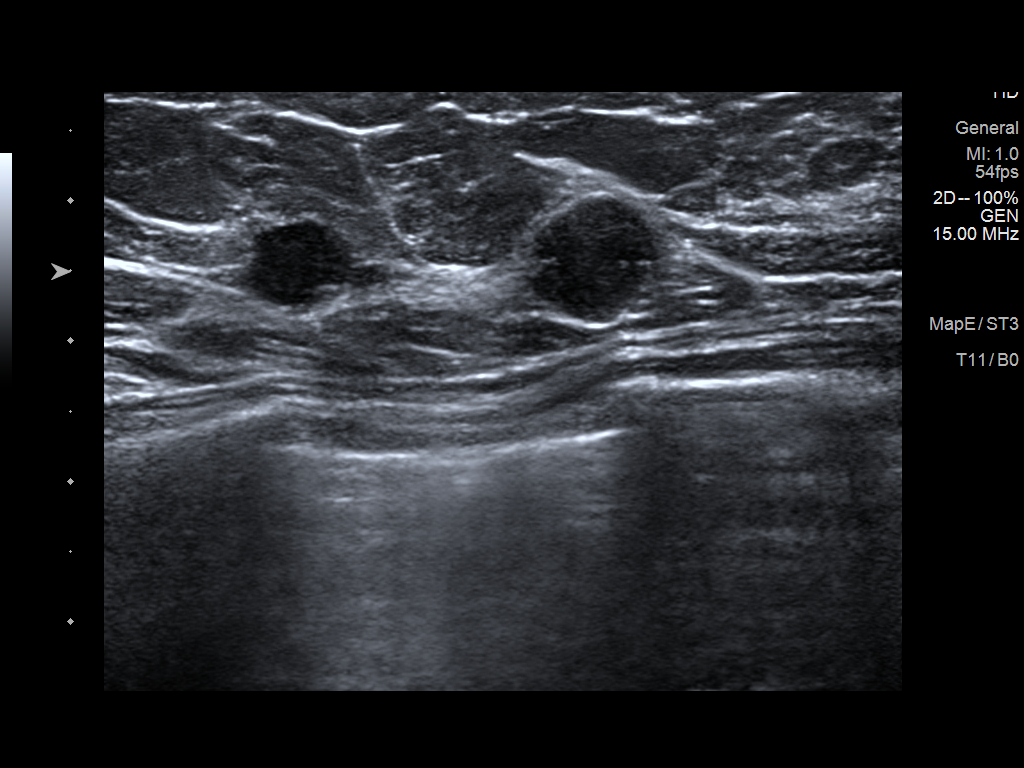
[im 2/4]
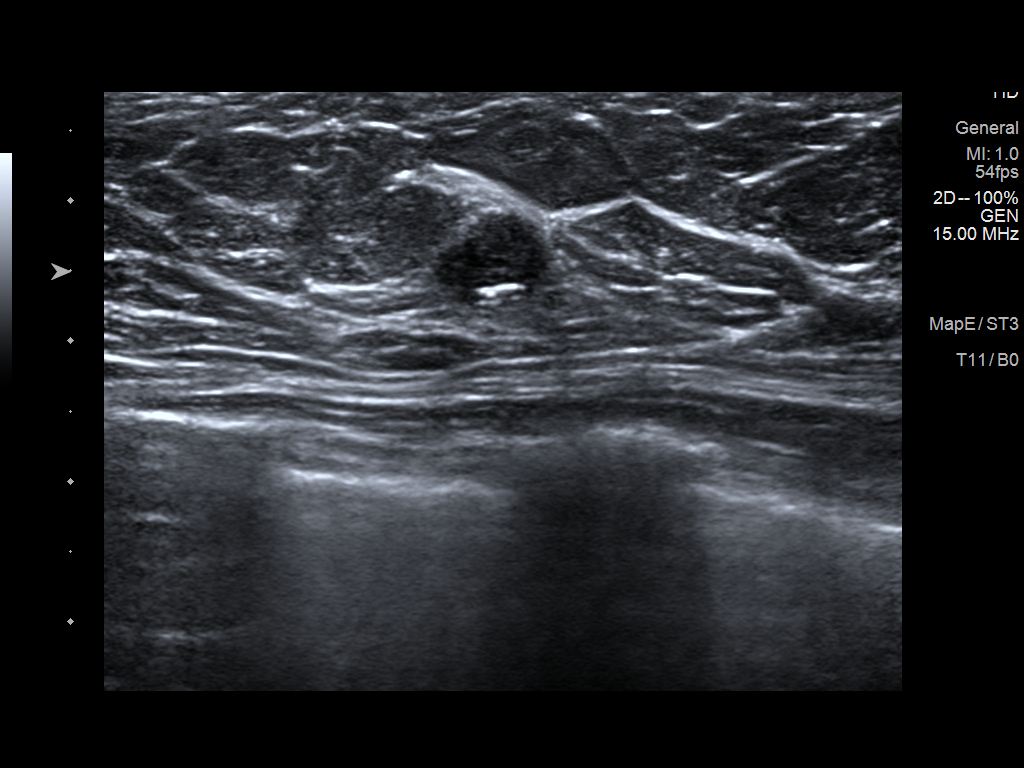
[im 3/4]
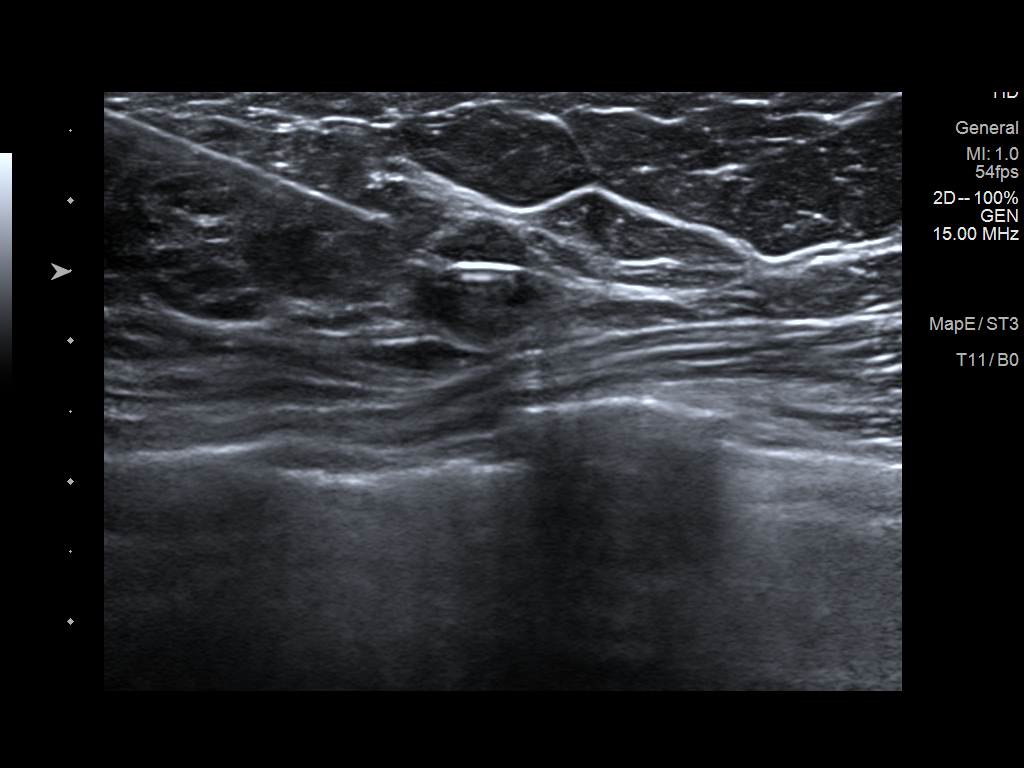
[im 4/4]
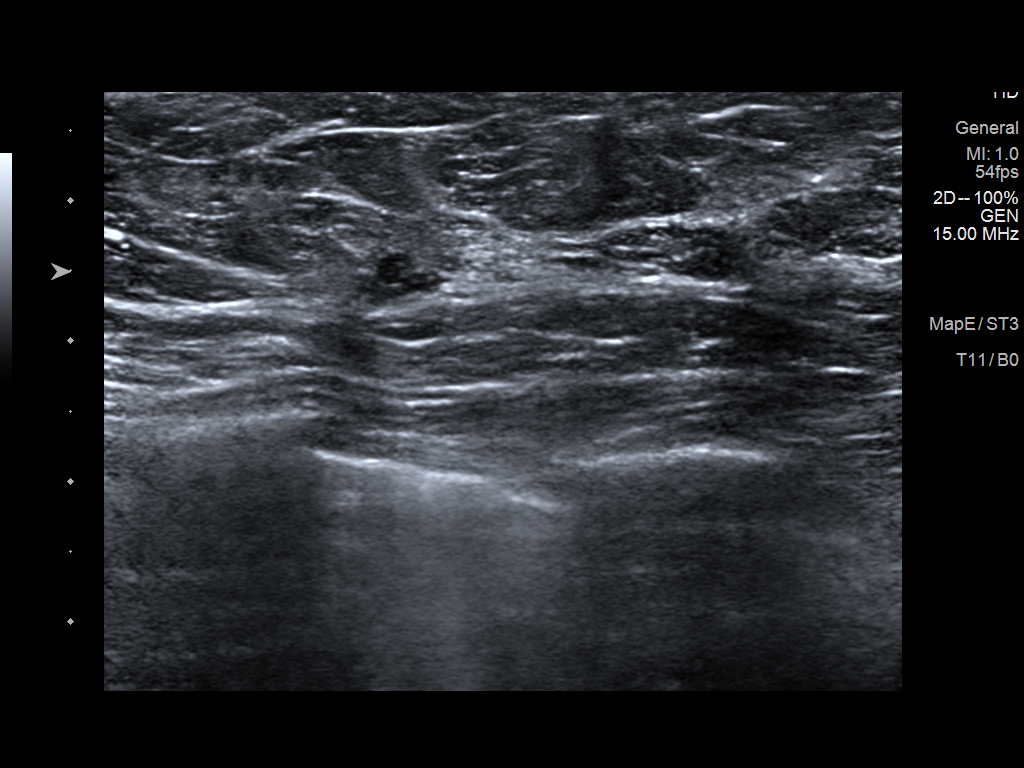

[4 of 4 positions shown; findings below may reference images not displayed]

FINDINGS: Patient presents for radioactive seed localization prior to left
lumpectomy. I met with the patient and we discussed the procedure of
seed localization including benefits and alternatives. We discussed
the high likelihood of a successful procedure. We discussed the
risks of the procedure including infection, bleeding, tissue injury
and further surgery. We discussed the low dose of radioactivity
involved in the procedure. Informed, written consent was given.

The usual time-out protocol was performed immediately prior to the
procedure.

Site 1: Left breast mass 2:30 o'clock

Using ultrasound guidance, sterile technique, 1% lidocaine and an
[UK] radioactive seed, left breast mass 2:30 o'clock was localized
using a lateral approach. The follow-up mammogram images confirm the
seed in the expected location and were marked for Dr. KOLBJORN.

Follow-up survey of the patient confirms presence of the radioactive
seed.

Order number of [UK] seed:  [PHONE_NUMBER].

Total activity:  0.241 millicuries reference Date: [DATE]

Site 2: Left breast mass 2:30 o'clock

Using ultrasound guidance, sterile technique, 1% lidocaine and an
[UK] radioactive seed, left breast mass 2:30 o'clock was localized
using a lateral approach. The follow-up mammogram images confirm the
seed in the expected location and were marked for Dr. KOLBJORN.

Follow-up survey of the patient confirms presence of the radioactive
seed.

Order number of [UK] seed:  [PHONE_NUMBER].

Total activity:  0.241 millicuries reference Date: [DATE]

The patient tolerated the procedure well and was released from the
[REDACTED]. She was given instructions regarding seed removal.
IMPRESSION: Radioactive seed localization left breast, 2 sites. No apparent
complications.

## 2020-04-21 NOTE — Telephone Encounter (Signed)
[employer]  CSW received request from RN/ surgeon's office to contact patient due to concern about pushing off surgery due to financial reasons. CSW has previously worked with patient and provided information and resources for assistance through breast cancer foundations. CSW attempted to call patient to follow-up. No answer, left VM.   Christeen Douglas, LCSW

## 2020-04-22 ENCOUNTER — Encounter (HOSPITAL_COMMUNITY): Payer: Self-pay

## 2020-04-22 ENCOUNTER — Encounter (HOSPITAL_COMMUNITY)
Admission: RE | Disposition: A | Discharge status: Home / Self Care | Payer: Self-pay | Source: Home / Self Care | Attending: Surgery

## 2020-04-22 ENCOUNTER — Encounter (HOSPITAL_COMMUNITY): Payer: Self-pay | Admitting: Surgery

## 2020-04-22 ENCOUNTER — Ambulatory Visit
Admission: RE | Admit: 2020-04-22 | Discharge: 2020-04-22 | Disposition: A | Discharge status: Home / Self Care | Payer: 59 | Source: Ambulatory Visit | Attending: Surgery | Admitting: Surgery

## 2020-04-22 ENCOUNTER — Ambulatory Visit (HOSPITAL_COMMUNITY): Payer: 59

## 2020-04-22 ENCOUNTER — Encounter (HOSPITAL_COMMUNITY): Payer: 59

## 2020-04-22 ENCOUNTER — Ambulatory Visit (HOSPITAL_COMMUNITY)
Admission: RE | Admit: 2020-04-22 | Discharge: 2020-04-22 | Disposition: A | Discharge status: Home / Self Care | Payer: 59 | Attending: Surgery | Admitting: Surgery

## 2020-04-22 ENCOUNTER — Other Ambulatory Visit: Payer: Self-pay

## 2020-04-22 DIAGNOSIS — K5900 Constipation, unspecified: Secondary | ICD-10-CM | POA: Diagnosis not present

## 2020-04-22 DIAGNOSIS — D241 Benign neoplasm of right breast: Secondary | ICD-10-CM | POA: Diagnosis not present

## 2020-04-22 DIAGNOSIS — Z87891 Personal history of nicotine dependence: Secondary | ICD-10-CM | POA: Insufficient documentation

## 2020-04-22 DIAGNOSIS — Z836 Family history of other diseases of the respiratory system: Secondary | ICD-10-CM | POA: Insufficient documentation

## 2020-04-22 DIAGNOSIS — K219 Gastro-esophageal reflux disease without esophagitis: Secondary | ICD-10-CM | POA: Diagnosis not present

## 2020-04-22 DIAGNOSIS — C50812 Malignant neoplasm of overlapping sites of left female breast: Secondary | ICD-10-CM | POA: Insufficient documentation

## 2020-04-22 DIAGNOSIS — Z8249 Family history of ischemic heart disease and other diseases of the circulatory system: Secondary | ICD-10-CM | POA: Insufficient documentation

## 2020-04-22 DIAGNOSIS — C50912 Malignant neoplasm of unspecified site of left female breast: Secondary | ICD-10-CM

## 2020-04-22 DIAGNOSIS — Z811 Family history of alcohol abuse and dependence: Secondary | ICD-10-CM | POA: Diagnosis not present

## 2020-04-22 DIAGNOSIS — Z8261 Family history of arthritis: Secondary | ICD-10-CM | POA: Insufficient documentation

## 2020-04-22 DIAGNOSIS — Z818 Family history of other mental and behavioral disorders: Secondary | ICD-10-CM | POA: Diagnosis not present

## 2020-04-22 DIAGNOSIS — Z170 Estrogen receptor positive status [ER+]: Secondary | ICD-10-CM

## 2020-04-22 DIAGNOSIS — Z87442 Personal history of urinary calculi: Secondary | ICD-10-CM | POA: Diagnosis not present

## 2020-04-22 HISTORY — PX: BREAST LUMPECTOMY: SHX2

## 2020-04-22 HISTORY — PX: BREAST LUMPECTOMY WITH RADIOACTIVE SEED LOCALIZATION: SHX6424

## 2020-04-22 IMAGING — MG MM BREAST SURGICAL SPECIMEN
1 series · 1 of 1 positions shown · non-contrast
Comparison: Previous exam(s).

CLINICAL DATA: Status post seed localized LEFT lumpectomy.

EXAM:
SPECIMEN RADIOGRAPH OF THE LEFT BREAST

[L]
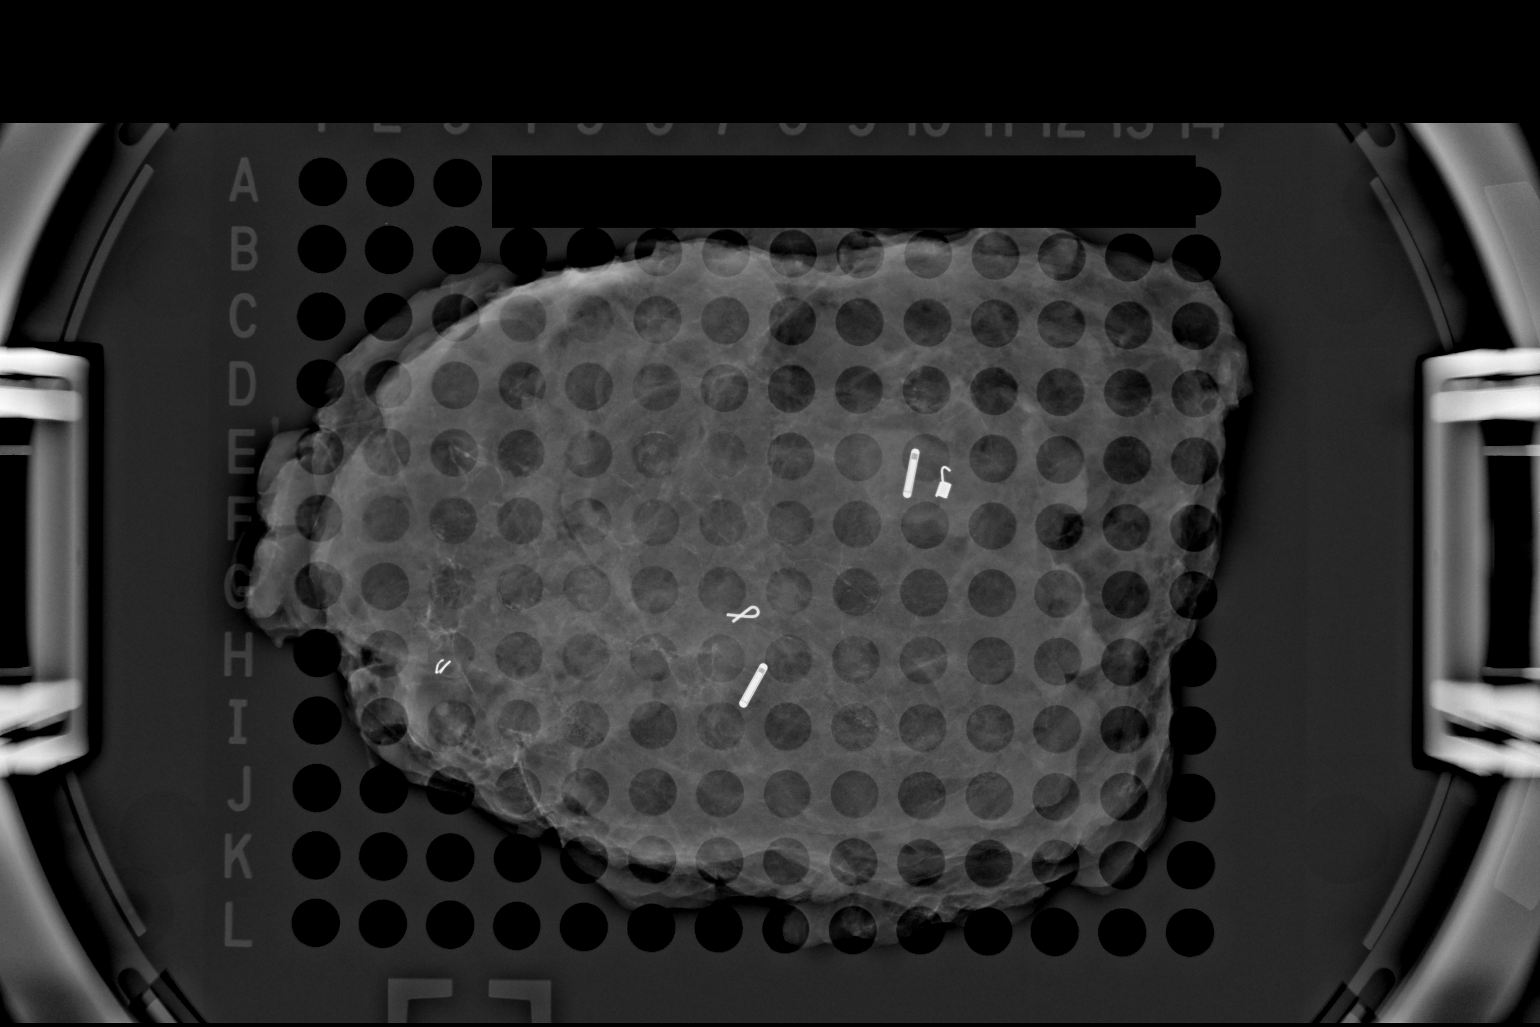

[1 of 1 positions shown; findings below may reference images not displayed]

FINDINGS: Status post excision of the left breast. Within the specimen, there
are 2 radioactive seeds, marked for pathology. In addition, there
are a coil shaped clip, a ribbon shaped clip, and heart shaped clips
which are also marked. Findings are discussed with the operating
room nurse at the time of interpretation.
IMPRESSION: Specimen radiograph of the left breast.

## 2020-04-22 SURGERY — BREAST LUMPECTOMY WITH RADIOACTIVE SEED LOCALIZATION
Anesthesia: General | Site: Breast | Laterality: Left

## 2020-04-22 MED ORDER — OXYCODONE HCL 5 MG PO TABS: 5.0000 mg | ORAL_TABLET | Freq: Once | ORAL | Status: DC | PRN

## 2020-04-22 MED ORDER — MIDAZOLAM HCL 2 MG/2ML IJ SOLN
INTRAMUSCULAR | Status: AC
  Filled 2020-04-22: qty 2

## 2020-04-22 MED ORDER — PHENYLEPHRINE 40 MCG/ML (10ML) SYRINGE FOR IV PUSH (FOR BLOOD PRESSURE SUPPORT)
PREFILLED_SYRINGE | INTRAVENOUS | Status: AC
  Filled 2020-04-22: qty 10

## 2020-04-22 MED ORDER — DEXAMETHASONE SODIUM PHOSPHATE 4 MG/ML IJ SOLN
INTRAMUSCULAR | Status: DC | PRN
  Administered 2020-04-22: 5 mg via INTRAVENOUS

## 2020-04-22 MED ORDER — CHLORHEXIDINE GLUCONATE 0.12 % MT SOLN
OROMUCOSAL | Status: AC
  Administered 2020-04-22: 15 mL via OROMUCOSAL
  Filled 2020-04-22: qty 15

## 2020-04-22 MED ORDER — LACTATED RINGERS IV SOLN: INTRAVENOUS | Status: DC

## 2020-04-22 MED ORDER — PROPOFOL 10 MG/ML IV BOLUS
INTRAVENOUS | Status: DC | PRN
  Administered 2020-04-22: 160 mg via INTRAVENOUS

## 2020-04-22 MED ORDER — PHENYLEPHRINE HCL (PRESSORS) 10 MG/ML IV SOLN
INTRAVENOUS | Status: DC | PRN
  Administered 2020-04-22: 80 ug via INTRAVENOUS

## 2020-04-22 MED ORDER — EPHEDRINE SULFATE 50 MG/ML IJ SOLN
INTRAMUSCULAR | Status: DC | PRN
  Administered 2020-04-22: 10 mg via INTRAVENOUS

## 2020-04-22 MED ORDER — OXYCODONE HCL 5 MG/5ML PO SOLN: 5.0000 mg | Freq: Once | ORAL | Status: DC | PRN

## 2020-04-22 MED ORDER — GLYCOPYRROLATE 0.2 MG/ML IJ SOLN
INTRAMUSCULAR | Status: DC | PRN
  Administered 2020-04-22: .2 mg via INTRAVENOUS

## 2020-04-22 MED ORDER — CEFAZOLIN SODIUM-DEXTROSE 2-4 GM/100ML-% IV SOLN
2.0000 g | INTRAVENOUS | Status: AC
  Administered 2020-04-22: 2 g via INTRAVENOUS

## 2020-04-22 MED ORDER — PHENYLEPHRINE HCL-NACL 10-0.9 MG/250ML-% IV SOLN
INTRAVENOUS | Status: DC | PRN
  Administered 2020-04-22: 30 ug/min via INTRAVENOUS

## 2020-04-22 MED ORDER — ACETAMINOPHEN 500 MG PO TABS
ORAL_TABLET | ORAL | Status: AC
  Administered 2020-04-22: 1000 mg via ORAL
  Filled 2020-04-22: qty 2

## 2020-04-22 MED ORDER — ACETAMINOPHEN 500 MG PO TABS: 1000.0000 mg | ORAL_TABLET | ORAL | Status: AC

## 2020-04-22 MED ORDER — FENTANYL CITRATE (PF) 250 MCG/5ML IJ SOLN
INTRAMUSCULAR | Status: AC
  Filled 2020-04-22: qty 5

## 2020-04-22 MED ORDER — CEFAZOLIN SODIUM-DEXTROSE 2-4 GM/100ML-% IV SOLN
INTRAVENOUS | Status: AC
  Filled 2020-04-22: qty 100

## 2020-04-22 MED ORDER — FENTANYL CITRATE (PF) 100 MCG/2ML IJ SOLN
INTRAMUSCULAR | Status: DC | PRN
  Administered 2020-04-22 (×2): 50 ug via INTRAVENOUS

## 2020-04-22 MED ORDER — LIDOCAINE 2% (20 MG/ML) 5 ML SYRINGE
INTRAMUSCULAR | Status: DC | PRN
  Administered 2020-04-22: 50 mg via INTRAVENOUS

## 2020-04-22 MED ORDER — MIDAZOLAM HCL 5 MG/5ML IJ SOLN
INTRAMUSCULAR | Status: DC | PRN
  Administered 2020-04-22 (×2): 1 mg via INTRAVENOUS

## 2020-04-22 MED ORDER — 0.9 % SODIUM CHLORIDE (POUR BTL) OPTIME
TOPICAL | Status: DC | PRN
  Administered 2020-04-22: 1000 mL

## 2020-04-22 MED ORDER — BUPIVACAINE HCL (PF) 0.25 % IJ SOLN
INTRAMUSCULAR | Status: DC | PRN
  Administered 2020-04-22: 30 mL

## 2020-04-22 MED ORDER — CHLORHEXIDINE GLUCONATE 4 % EX LIQD: 60.0000 mL | Freq: Once | CUTANEOUS | Status: DC

## 2020-04-22 MED ORDER — LIDOCAINE HCL (PF) 2 % IJ SOLN
INTRAMUSCULAR | Status: AC
  Filled 2020-04-22: qty 5

## 2020-04-22 MED ORDER — DEXAMETHASONE SODIUM PHOSPHATE 10 MG/ML IJ SOLN
INTRAMUSCULAR | Status: AC
  Filled 2020-04-22: qty 1

## 2020-04-22 MED ORDER — CHLORHEXIDINE GLUCONATE 0.12 % MT SOLN: 15.0000 mL | Freq: Once | OROMUCOSAL | Status: AC

## 2020-04-22 MED ORDER — GLYCOPYRROLATE PF 0.2 MG/ML IJ SOSY
PREFILLED_SYRINGE | INTRAMUSCULAR | Status: AC
  Filled 2020-04-22: qty 1

## 2020-04-22 MED ORDER — ONDANSETRON HCL 4 MG/2ML IJ SOLN
INTRAMUSCULAR | Status: DC | PRN
  Administered 2020-04-22: 4 mg via INTRAVENOUS

## 2020-04-22 MED ORDER — FENTANYL CITRATE (PF) 100 MCG/2ML IJ SOLN: 25.0000 ug | INTRAMUSCULAR | Status: DC | PRN

## 2020-04-22 MED ORDER — HYDROCODONE-ACETAMINOPHEN 5-325 MG PO TABS: 1.0000 | ORAL_TABLET | Freq: Four times a day (QID) | ORAL | 0 refills | Status: DC | PRN

## 2020-04-22 MED ORDER — ONDANSETRON HCL 4 MG/2ML IJ SOLN
INTRAMUSCULAR | Status: AC
  Filled 2020-04-22: qty 2

## 2020-04-22 MED ORDER — ONDANSETRON HCL 4 MG/2ML IJ SOLN: 4.0000 mg | Freq: Four times a day (QID) | INTRAMUSCULAR | Status: AC | PRN

## 2020-04-22 MED ORDER — ONDANSETRON HCL 4 MG/2ML IJ SOLN
INTRAMUSCULAR | Status: AC
  Administered 2020-04-22: 4 mg via INTRAVENOUS
  Filled 2020-04-22: qty 2

## 2020-04-22 MED ORDER — ORAL CARE MOUTH RINSE: 15.0000 mL | Freq: Once | OROMUCOSAL | Status: AC

## 2020-04-22 MED ORDER — FENTANYL CITRATE (PF) 100 MCG/2ML IJ SOLN
INTRAMUSCULAR | Status: AC
  Filled 2020-04-22: qty 2

## 2020-04-22 MED ORDER — BUPIVACAINE HCL (PF) 0.25 % IJ SOLN
INTRAMUSCULAR | Status: AC
  Filled 2020-04-22: qty 30

## 2020-04-22 SURGICAL SUPPLY — 41 items
BINDER BREAST LRG (GAUZE/BANDAGES/DRESSINGS) IMPLANT
BINDER BREAST XLRG (GAUZE/BANDAGES/DRESSINGS) ×4 IMPLANT
CANISTER SUCT 3000ML PPV (MISCELLANEOUS) IMPLANT
CHLORAPREP W/TINT 26 (MISCELLANEOUS) ×4 IMPLANT
CLIP VESOCCLUDE SM WIDE 6/CT (CLIP) ×4 IMPLANT
COVER PROBE W GEL 5X96 (DRAPES) ×4 IMPLANT
COVER SURGICAL LIGHT HANDLE (MISCELLANEOUS) ×4 IMPLANT
COVER WAND RF STERILE (DRAPES) ×4 IMPLANT
DECANTER SPIKE VIAL GLASS SM (MISCELLANEOUS) ×4 IMPLANT
DERMABOND ADVANCED (GAUZE/BANDAGES/DRESSINGS) ×2
DERMABOND ADVANCED .7 DNX12 (GAUZE/BANDAGES/DRESSINGS) ×2 IMPLANT
DEVICE DUBIN SPECIMEN MAMMOGRA (MISCELLANEOUS) ×4 IMPLANT
DRAPE CHEST BREAST 15X10 FENES (DRAPES) ×4 IMPLANT
DRAPE HALF SHEET 70X43 (DRAPES) ×4 IMPLANT
DRSG PAD ABDOMINAL 8X10 ST (GAUZE/BANDAGES/DRESSINGS) ×4 IMPLANT
ELECT COATED BLADE 2.86 ST (ELECTRODE) ×4 IMPLANT
ELECT REM PT RETURN 9FT ADLT (ELECTROSURGICAL) ×4
ELECTRODE REM PT RTRN 9FT ADLT (ELECTROSURGICAL) ×2 IMPLANT
GAUZE SPONGE 4X4 12PLY STRL (GAUZE/BANDAGES/DRESSINGS) ×4 IMPLANT
GLOVE SURG SYN 7.5  E (GLOVE) ×8
GLOVE SURG SYN 7.5 E (GLOVE) ×4 IMPLANT
GOWN STRL REUS W/ TWL LRG LVL3 (GOWN DISPOSABLE) ×2 IMPLANT
GOWN STRL REUS W/ TWL XL LVL3 (GOWN DISPOSABLE) ×2 IMPLANT
GOWN STRL REUS W/TWL LRG LVL3 (GOWN DISPOSABLE) ×4
GOWN STRL REUS W/TWL XL LVL3 (GOWN DISPOSABLE) ×4
ILLUMINATOR WAVEGUIDE N/F (MISCELLANEOUS) IMPLANT
KIT BASIN OR (CUSTOM PROCEDURE TRAY) ×4 IMPLANT
KIT MARKER MARGIN INK (KITS) ×4 IMPLANT
LIGHT WAVEGUIDE WIDE FLAT (MISCELLANEOUS) IMPLANT
NEEDLE 18GX1X1/2 (RX/OR ONLY) (NEEDLE) IMPLANT
NEEDLE FILTER BLUNT 18X 1/2SAF (NEEDLE)
NEEDLE FILTER BLUNT 18X1 1/2 (NEEDLE) IMPLANT
NEEDLE HYPO 25GX1X1/2 BEV (NEEDLE) ×4 IMPLANT
NS IRRIG 1000ML POUR BTL (IV SOLUTION) ×4 IMPLANT
PACK GENERAL/GYN (CUSTOM PROCEDURE TRAY) ×4 IMPLANT
PENCIL SMOKE EVACUATOR (MISCELLANEOUS) ×4 IMPLANT
SUT MNCRL AB 4-0 PS2 18 (SUTURE) ×4 IMPLANT
SUT VIC AB 3-0 SH 8-18 (SUTURE) ×4 IMPLANT
SYR CONTROL 10ML LL (SYRINGE) ×4 IMPLANT
TOWEL GREEN STERILE (TOWEL DISPOSABLE) ×4 IMPLANT
TOWEL GREEN STERILE FF (TOWEL DISPOSABLE) ×4 IMPLANT

## 2020-04-22 NOTE — Anesthesia Preprocedure Evaluation (Signed)
Anesthesia Evaluation  Patient identified by MRN, date of birth, ID band Patient awake    Reviewed: Allergy & Precautions, H&P , NPO status , Patient's Chart, lab work & pertinent test results  Airway Mallampati: II   Neck ROM: full    Dental   Pulmonary former smoker,    breath sounds clear to auscultation       Cardiovascular negative cardio ROS   Rhythm:regular Rate:Normal     Neuro/Psych PSYCHIATRIC DISORDERS Anxiety  Neuromuscular disease    GI/Hepatic   Endo/Other    Renal/GU      Musculoskeletal  (+) Arthritis ,   Abdominal   Peds  Hematology   Anesthesia Other Findings   Reproductive/Obstetrics Breast CA                             Anesthesia Physical Anesthesia Plan  ASA: II  Anesthesia Plan: General   Post-op Pain Management:  Regional for Post-op pain   Induction: Intravenous  PONV Risk Score and Plan: 3 and Ondansetron, Dexamethasone, Midazolam and Treatment may vary due to age or medical condition  Airway Management Planned: LMA  Additional Equipment:   Intra-op Plan:   Post-operative Plan: Extubation in OR  Informed Consent: I have reviewed the patients History and Physical, chart, labs and discussed the procedure including the risks, benefits and alternatives for the proposed anesthesia with the patient or authorized representative who has indicated his/her understanding and acceptance.       Plan Discussed with: CRNA, Anesthesiologist and Surgeon  Anesthesia Plan Comments:         Anesthesia Quick Evaluation

## 2020-04-22 NOTE — Progress Notes (Signed)
Dr. Lucia Gaskins made aware of the pt wanting to change the scheduled procedure. He sts he will come and sit with pt to discuss concerns and the plan for surgery.

## 2020-04-22 NOTE — Op Note (Signed)
04/22/2020  9:32 AM  PATIENT:  Beverly Mcclain DOB: 1960-07-18 MRN: [id number]  PREOP DIAGNOSIS:   LEFT BREAST CANCER, RIGHT BREAST PAPILLOMA  POSTOP DIAGNOSIS:    Left breast cancer, 2 and 3 o'clock position (T1, N0)  PROCEDURE:   Procedure(s): Left BREAST LUMPECTOMY WITH RADIOACTIVE SEED (single incision), 2 BRACKETED SEEDS LEFT BREAST   SURGEON:   Alphonsa Overall, M.D.  ANESTHESIA:   General  Anesthesiologist: Albertha Ghee, MD CRNA: Alain Marion, CRNA  General  EBL:  <75  ml  DRAINS:  none   LOCAL MEDICATIONS USED:   30 cc 1/4% marcaine  SPECIMEN:   Left breast lumpectomy (6 color paint)  COUNTS CORRECT:  YES  INDICATIONS FOR PROCEDURE:  Beverly Mcclain is a 60 y.o. (DOB: October 29, 1960) white female whose primary care physician is Maury Dus, MD and comes for left breast lumpectomy x 2.   The options for breast cancer treatment have been discussed with the patient. She elected to proceed with left breast lumpectomy alone.  She was originally seen at the [facility] with Drs. [place] and [place].   I have had lengthy discussions with the patient about the treatment of her breast cancer.  At this time, she wants very limited treatment and has agreed to surgical removal of the primary left breast cancer only.  She understands that this is not standard treatment.  We have discussed chemotherapy (she has an elevated Oncotype), radiation therapy, and axillary sentinel lymph node biopsy, but she has refused all these modalities at this time.    The indications and potential complications of surgery were explained to the patient. Potential complications include, but are not limited to, bleeding, infection, the need for further surgery, and nerve injury.     She had two I131 seed placed on 04/21/2020 in her left breast at [facility].  The seed is in the 2:30 and 3:00 o'clock position of the left breast.     OPERATIVE NOTE:   The patient was taken to  operating room # 2 at [facility] where she underwent a general anesthesia  supervised by Anesthesiologist: Albertha Ghee, MD CRNA: Alain Marion, CRNA. Her left breast and axilla were prepped with  ChloraPrep and sterilely draped.    A time-out was held and the surgical check list was reviewed.    The cancers wer about at the 2:30 and 3:00 o'clock position of the left breast.   The tumors are about 5 to 6 cm from the edge of the areola.  I used the Neoprobe to identify the two  I131 seeds.  I made a single incision laterally at 3:00 o'clock.   I tried to excise an area around the tumor of at least 1 cm.    I excised this block of breast tissue approximately 6 cm by 6 cm  in diameter.   I painted the lumpectomy specimen with the 6 color paint kit and did a specimen mammogram which confirmed both masses, both clips, and both seed were all in the right position in the specimen.  The specimen was sent to pathology who called back to confirm that they have the seed and the specimen.   I took an additional superior and inferior margin.  The margins were painted and a suture marked the anterior edge of the biopsy.   I then irrigated the wounds with saline. I infiltrated approximately 30 mL of 1/4% Marcaine in the incision. I placed 4 clips to mark biopsy cavity,  at 12, 3, 6, and 9 o'clock.  I then close the wound in layers using 3-0 Vicryl sutures for the deep layer. At the skin, I closed the incision with a 4-0 Monocryl suture. The incision was then painted with Dermabond.  She had gauze placed over the wounds and her breast placed in a breast binder.   The patient tolerated the procedure well, was transported to the recovery room in good condition. Sponge and needle count were correct at the end of the case.   Final pathology is pending.   Alphonsa Overall, MD, [facility] Surgery Pager: [phone] Office phone:  [phone]

## 2020-04-22 NOTE — Interval H&P Note (Signed)
History and Physical Interval Note:  04/22/2020 9:14 AM  Beverly Mcclain  has presented today for surgery, with the diagnosis of LEFT BREAST CANCER, RIGHT BREAST PAPILLOMA.  The various methods of treatment have been discussed with the patient and family.  I have had a lengthy discussion with the patient.  She has decided to have a left breast lumpectomy for removal of the two radioactive seeds.  We will not do a left axillary sentinel lymph node biopsy and will not do a right breast lumpectomy.  She has a friend, Belenda Cruise "[employer], as contact.  Her husband had a knee replacement 2 weeks ago.  We talked about financial help - she has been in contact with Garnet Koyanagi, but has not followed up.  After consideration of risks, benefits and other options for treatment, the patient has consented to  Procedure(s) with comments: BILATERAL BREAST LUMPECTOMY WITH RADIOACTIVE SEED, 2 BRACKETED SEEDS LEFT BREAST AND 1 SEED IN RIGHT BREAST AND LEFT AXILLARY SENTINEL LYMPH NODE BIOPSY (Bilateral) - LEFT PEC BLOCK as a surgical intervention.  The patient's history has been reviewed, patient examined, no change in status, stable for surgery.  I have reviewed the patient's chart and labs.  Questions were answered to the patient's satisfaction.     Shann Medal

## 2020-04-22 NOTE — Discharge Instructions (Signed)
[facility] - DISCHARGE INSTRUCTIONS TO PATIENT  Return to work on:  7 to 10 days  Activity:  Driving - May drive in one to 3 days, if doing well and off pain meds   Lifting - No lifting more than 15 pounds for 5 days, then no limit                       Practice your Covid-19 protection:  Wear a mask, social distance, and wash your hands frequently  Wound Care:   Leave the incision dry for 2 days, then you may shower  Diet:  As tolerated  Follow up appointment:  Call Dr. Pollie Friar office [facility]) at [phone] for an appointment in 1 to 2 weeks.  Medications and dosages:  Resume your home medications.  You have a prescription for:  Vicodin             You may also take Tylenol, ibuprofen, or Aleve for pain  Call Dr. Lucia Gaskins or his office  [phone]) if you have:  Temperature greater than 100.4,  Persistent nausea and vomiting,  Severe uncontrolled pain,  Redness, tenderness, or signs of infection (pain, swelling, redness, odor or green/yellow discharge around the site),  Difficulty breathing, headache or visual disturbances,  Any other questions or concerns you may have after discharge.  In an emergency, call 911 or go to an Emergency Department at a nearby hospital.

## 2020-04-22 NOTE — Transfer of Care (Signed)
Immediate Anesthesia Transfer of Care Note  Patient: Beverly Mcclain  Procedure(s) Performed: LEFT BREAST LUMPECTOMY WITH RADIOACTIVE SEED LOCALIZATION TIMES TWO (Left Breast)  Patient Location: PACU  Anesthesia Type:General  Level of Consciousness: awake, alert  and oriented  Airway & Oxygen Therapy: Patient Spontanous Breathing and Patient connected to face mask oxygen  Post-op Assessment: Report given to RN and Post -op Vital signs reviewed and stable  Post vital signs: Reviewed and stable  Last Vitals:  Vitals Value Taken Time  BP 121/67   Temp    Pulse 63   Resp 13   SpO2 100     Last Pain:  Vitals:   04/22/20 0734  TempSrc:   PainSc: 0-No pain      Patients Stated Pain Goal: 3 (37/54/23 7023)  Complications: No complications documented.

## 2020-04-22 NOTE — Anesthesia Procedure Notes (Signed)
Procedure Name: LMA Insertion Date/Time: 04/22/2020 9:39 AM Performed by: Alain Marion, CRNA Pre-anesthesia Checklist: Patient identified, Emergency Drugs available, Suction available and Patient being monitored Patient Re-evaluated:Patient Re-evaluated prior to induction Oxygen Delivery Method: Circle System Utilized Preoxygenation: Pre-oxygenation with 100% oxygen Induction Type: IV induction LMA: LMA inserted LMA Size: 4.0 Number of attempts: 1 Placement Confirmation: positive ETCO2 Tube secured with: Tape Dental Injury: Teeth and Oropharynx as per pre-operative assessment  Comments: Performed by Desma Mcgregor

## 2020-04-23 ENCOUNTER — Encounter (HOSPITAL_COMMUNITY): Payer: Self-pay | Admitting: Surgery

## 2020-04-25 NOTE — Anesthesia Postprocedure Evaluation (Signed)
Anesthesia Post Note  Patient: Beverly Mcclain  Procedure(s) Performed: LEFT BREAST LUMPECTOMY WITH RADIOACTIVE SEED LOCALIZATION TIMES TWO (Left Breast)     Patient location during evaluation: PACU Anesthesia Type: General Level of consciousness: awake and alert Pain management: pain level controlled Vital Signs Assessment: post-procedure vital signs reviewed and stable Respiratory status: spontaneous breathing, nonlabored ventilation, respiratory function stable and patient connected to nasal cannula oxygen Cardiovascular status: blood pressure returned to baseline and stable Postop Assessment: no apparent nausea or vomiting Anesthetic complications: no   No complications documented.  Last Vitals:  Vitals:   04/22/20 1115 04/22/20 1130  BP: 111/66 117/74  Pulse: 70 73  Resp: 12 16  Temp:    SpO2: 100% 100%    Last Pain:  Vitals:   04/22/20 1130  TempSrc:   PainSc: 0-No pain                 HODIERNE,ADAM S

## 2020-04-26 LAB — SURGICAL PATHOLOGY

## 2020-04-29 ENCOUNTER — Encounter: Payer: Self-pay | Admitting: *Deleted

## 2020-04-30 ENCOUNTER — Other Ambulatory Visit: Payer: Self-pay

## 2020-04-30 ENCOUNTER — Inpatient Hospital Stay: Payer: 59 | Attending: Hematology and Oncology | Admitting: Hematology and Oncology

## 2020-04-30 ENCOUNTER — Telehealth: Payer: Self-pay | Admitting: Hematology and Oncology

## 2020-04-30 DIAGNOSIS — Z170 Estrogen receptor positive status [ER+]: Secondary | ICD-10-CM | POA: Diagnosis not present

## 2020-04-30 DIAGNOSIS — Z9012 Acquired absence of left breast and nipple: Secondary | ICD-10-CM | POA: Insufficient documentation

## 2020-04-30 DIAGNOSIS — Z923 Personal history of irradiation: Secondary | ICD-10-CM | POA: Insufficient documentation

## 2020-04-30 DIAGNOSIS — Z9221 Personal history of antineoplastic chemotherapy: Secondary | ICD-10-CM | POA: Insufficient documentation

## 2020-04-30 DIAGNOSIS — Z79899 Other long term (current) drug therapy: Secondary | ICD-10-CM | POA: Diagnosis not present

## 2020-04-30 DIAGNOSIS — Z79811 Long term (current) use of aromatase inhibitors: Secondary | ICD-10-CM | POA: Diagnosis not present

## 2020-04-30 DIAGNOSIS — C50412 Malignant neoplasm of upper-outer quadrant of left female breast: Secondary | ICD-10-CM | POA: Insufficient documentation

## 2020-04-30 MED ORDER — ANASTROZOLE 1 MG PO TABS: 1.0000 mg | ORAL_TABLET | Freq: Every day | ORAL | 3 refills | Status: DC

## 2020-04-30 NOTE — Assessment & Plan Note (Addendum)
12/19/2019:Screening mammogram showed a possible mass and 2 asymmetries in the left breast. 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 T1cN 0 stage Ib clinical stage Oncotype DX: 43, risk of distant recurrence at 9 years: 31%: High risk CT CAP and bone scan: No metastatic disease Breast MRI 01/02/2020: 4.5 cm right breast mass at 12:00, non-mass enhancement 3.7 cm 12:00, 2 previously known malignancies 1.2 cm, 6 mm enhancement, 8 mmNMEleft breast (recommended biopsies)  Treatment plan: 1. Breast conserving surgerywith sentinel lymph node biopsy 04/22/2020 2.adjuvant chemotherapy with Taxotere and Cytoxan every 3 weeks x4 cycles (patient refused) 3. Adjuvant radiation therapy (patient refused) 4. Adjuvant antiestrogen therapy -------------------------------------------------------------------------------------------------------------------------- Left lumpectomy: Multifocal IDC 1.5 cm, grade 3, margins negative, 0/1 lymph node negative, ER 80% weak, PR 0%, HER-2 negative, Ki-67 90%  Pathology counseling: I discussed the final pathology report of the patient provided  a copy of this report. I discussed the margins as well as lymph node surgeries. We also discussed the final staging along with previously performed ER/PR and HER-2/neu testing.  Recommendation: Based on her wishes not to undergo chemo or radiation, I recommended starting antiestrogen therapy.  Anastrozole counseling:We discussed the risks and benefits of anti-estrogen therapy with aromatase inhibitors. These include but not limited to insomnia, hot flashes, mood changes, vaginal dryness, bone density loss, and weight gain. We strongly believe that the benefits far outweigh the risks. Patient understands these risks and consented to  starting treatment. Planned treatment duration is 7 years.  Return to clinic in 3 months for survivorship care plan visit

## 2020-04-30 NOTE — Progress Notes (Signed)
Patient Care Team: Maury Dus, MD as PCP - General (Family Medicine) Mauro Kaufmann, RN as Oncology Nurse Navigator Rockwell Germany, RN as Oncology Nurse Navigator Alphonsa Overall, MD as Consulting Physician (General Surgery) Nicholas Lose, MD as Consulting Physician (Hematology and Oncology) Kyung Rudd, MD as Consulting Physician (Radiation Oncology)  DIAGNOSIS:  Encounter Diagnosis  Name Primary?  . Malignant neoplasm of upper-outer quadrant of left breast in female, estrogen receptor positive ([place])     SUMMARY OF ONCOLOGIC HISTORY: Oncology History  Malignant neoplasm of upper-outer quadrant of left breast in female, estrogen receptor positive ([place])  12/19/2019 Initial Diagnosis   Screening mammogram showed a possible mass and 2 asymmetries in the left breast. Diagnostic mammogram showed two masses 1.4cm apart in the left breast at the 2:30 position, 0.7cm and 1.1cm, and two likely cysts, a 0.5cm mass a the 3:00 position, and a 0.9cm mass at the 11:00 position. Biopsy showed IDC in both masses at the 2:30 position, grade 3, HER-2 equivocal by IHC (2+), negative by FISH, ER+ 80%, PR- 0%, KI67 90%.   12/19/2019 Oncotype testing   Oncotype DX score 43: Distant recurrence at 9 years 31%   12/24/2019 Cancer Staging   Staging form: Breast, AJCC 8th Edition - Clinical stage from 12/24/2019: Stage IB (cT1c, cN0, cM0, G3, ER+, PR-, HER2-) - Signed by Nicholas Lose, MD on 12/24/2019   04/22/2020 Surgery   Left lumpectomy: Multifocal IDC 1.5 cm, grade 3, margins negative, 0/1 lymph node negative, ER 80% weak, PR 0%, HER-2 negative, Ki-67 90%     CHIEF COMPLIANT: Follow-up after recent left lumpectomy  INTERVAL HISTORY: Graceanna Theissen is a 59 year old with above-mentioned history of left breast cancer underwent lumpectomy on 04/22/2020.  She is here to discuss the final pathology report and to come up with a treatment plan.  She had previously been offered chemotherapy and radiation but  she does not want to go through any of those.  She was inquiring if immunotherapy may be an option.  She is recovering very well from recent surgery.   ALLERGIES:  is allergic to ibuprofen, sulfa antibiotics, and tuberculin tests.  MEDICATIONS:  Current Outpatient Medications  Medication Sig Dispense Refill  . acetaminophen (TYLENOL) 500 MG tablet Take 1,000 mg by mouth every 6 (six) hours as needed for moderate pain.     Marland Kitchen ALPRAZolam (XANAX) 0.5 MG tablet Take 0.25 mg by mouth daily as needed for anxiety.    Marland Kitchen anastrozole (ARIMIDEX) 1 MG tablet Take 1 tablet (1 mg total) by mouth daily. 90 tablet 3  . APPLE CIDER VINEGAR PO Take 400 mg by mouth daily.    Marland Kitchen ascorbic acid (VITAMIN C) 1000 MG tablet Take 1,000 mg by mouth daily.     Marland Kitchen b complex vitamins capsule Take 1 capsule by mouth daily.    . cholecalciferol (VITAMIN D3) 25 MCG (1000 UNIT) tablet Take 1,000 Units by mouth daily.    . Cyanocobalamin (VITAMIN B-12) 5000 MCG SUBL Place 5,000 mcg under the tongue daily.     . diphenhydrAMINE (BENADRYL) 25 MG tablet Take 25 mg by mouth daily as needed for allergies.    Marland Kitchen HYDROcodone-acetaminophen (NORCO/VICODIN) 5-325 MG tablet Take 0.5 tablets by mouth 2 (two) times daily as needed for moderate pain.    Marland Kitchen HYDROcodone-acetaminophen (NORCO/VICODIN) 5-325 MG tablet Take 1 tablet by mouth every 6 (six) hours as needed for moderate pain. 15 tablet 0  . meloxicam (MOBIC) 15 MG tablet Take 15 mg by  mouth daily as needed for pain.     Marland Kitchen sertraline (ZOLOFT) 100 MG tablet Take 50 mg by mouth daily.     . TURMERIC PO Take 1 capsule by mouth daily.     No current facility-administered medications for this visit.    PHYSICAL EXAMINATION: ECOG PERFORMANCE STATUS: 1 - Symptomatic but completely ambulatory  Vitals:   04/30/20 0911  BP: 120/76  Pulse: 69  Resp: 20  Temp: 98.7 F (37.1 C)  SpO2: 99%   Filed Weights   04/30/20 0911  Weight: 149 lb 12.8 oz (67.9 kg)       LABORATORY DATA:  I  have reviewed the data as listed CMP Latest Ref Rng & Units 04/07/2020 12/24/2019  Glucose 70 - 99 mg/dL 91 92  BUN 6 - 20 mg/dL 18 14  Creatinine 0.44 - 1.00 mg/dL 0.77 0.80  Sodium 135 - 145 mmol/L 140 140  Potassium 3.5 - 5.1 mmol/L 4.2 4.1  Chloride 98 - 111 mmol/L 106 107  CO2 22 - 32 mmol/L 27 26  Calcium 8.9 - 10.3 mg/dL 9.4 10.0  Total Protein 6.5 - 8.1 g/dL 7.0 7.1  Total Bilirubin 0.3 - 1.2 mg/dL 0.5 0.3  Alkaline Phos 38 - 126 U/L 65 82  AST 15 - 41 U/L 20 15  ALT 0 - 44 U/L 14 12    Lab Results  Component Value Date   WBC 6.4 04/20/2020   HGB 13.3 04/20/2020   HCT 41.1 04/20/2020   MCV 93.6 04/20/2020   PLT 301 04/20/2020   NEUTROABS 3.2 12/24/2019    ASSESSMENT & PLAN:  Malignant neoplasm of upper-outer quadrant of left breast in female, estrogen receptor positive ([place]) 12/19/2019:Screening mammogram showed a possible mass and 2 asymmetries in the left breast. 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 T1cN 0 stage Ib clinical stage Oncotype DX: 43, risk of distant recurrence at 9 years: 31%: High risk CT CAP and bone scan: No metastatic disease Breast MRI 01/02/2020: 4.5 cm right breast mass at 12:00, non-mass enhancement 3.7 cm 12:00, 2 previously known malignancies 1.2 cm, 6 mm enhancement, 8 mmNMEleft breast (recommended biopsies)  Treatment plan: 1. Breast conserving surgerywith sentinel lymph node biopsy 04/22/2020 2.adjuvant chemotherapy with Taxotere and Cytoxan every 3 weeks x4 cycles (patient refused) 3. Adjuvant radiation therapy (patient refused) 4. Adjuvant antiestrogen therapy -------------------------------------------------------------------------------------------------------------------------- Left lumpectomy: Multifocal IDC 1.5 cm, grade 3,  margins negative, 0/1 lymph node negative, ER 80% weak, PR 0%, HER-2 negative, Ki-67 90%  Pathology counseling: I discussed the final pathology report of the patient provided  a copy of this report. I discussed the margins as well as lymph node surgeries. We also discussed the final staging along with previously performed ER/PR and HER-2/neu testing.  Recommendation: Based on her wishes not to undergo chemo or radiation, I recommended starting antiestrogen therapy.  Anastrozole counseling:We discussed the risks and benefits of anti-estrogen therapy with aromatase inhibitors. These include but not limited to insomnia, hot flashes, mood changes, vaginal dryness, bone density loss, and weight gain. We strongly believe that the benefits far outweigh the risks. Patient understands these risks and consented to starting treatment. Planned treatment duration is 7 years.  Genetics consult will also be made because she has lots of family history that is unknown.  Return to clinic in 3 months for survivorship care plan visit   Orders Placed This Encounter  Procedures  . Ambulatory referral to Genetics    Referral Priority:   Routine    Referral Type:   Consultation    Referral Reason:   Specialty Services Required    Number of Visits Requested:   1   The patient has a good understanding of the overall plan. she agrees with it. she will call with any problems that may develop before the next visit here. Total time spent: 30 mins including face to face time and time spent for planning, charting and co-ordination of care   Harriette Ohara, MD 04/30/20

## 2020-04-30 NOTE — Telephone Encounter (Signed)
Scheduled appt per 12/10 los- pt is aware of appt date and time ./ my chart active.

## 2020-05-03 ENCOUNTER — Encounter: Payer: Self-pay | Admitting: *Deleted

## 2020-05-04 ENCOUNTER — Inpatient Hospital Stay (HOSPITAL_BASED_OUTPATIENT_CLINIC_OR_DEPARTMENT_OTHER): Payer: 59 | Admitting: Genetic Counselor

## 2020-05-04 ENCOUNTER — Other Ambulatory Visit: Payer: Self-pay | Admitting: Genetic Counselor

## 2020-05-04 ENCOUNTER — Other Ambulatory Visit: Payer: Self-pay

## 2020-05-04 ENCOUNTER — Inpatient Hospital Stay: Payer: 59

## 2020-05-04 ENCOUNTER — Encounter: Payer: Self-pay | Admitting: Genetic Counselor

## 2020-05-04 DIAGNOSIS — Z800 Family history of malignant neoplasm of digestive organs: Secondary | ICD-10-CM | POA: Insufficient documentation

## 2020-05-04 DIAGNOSIS — Z170 Estrogen receptor positive status [ER+]: Secondary | ICD-10-CM

## 2020-05-04 DIAGNOSIS — C50412 Malignant neoplasm of upper-outer quadrant of left female breast: Secondary | ICD-10-CM | POA: Diagnosis not present

## 2020-05-04 DIAGNOSIS — Z806 Family history of leukemia: Secondary | ICD-10-CM | POA: Diagnosis not present

## 2020-05-04 LAB — GENETIC SCREENING ORDER

## 2020-05-04 NOTE — Progress Notes (Signed)
REFERRING PROVIDER: Nicholas Lose, MD [place],  [place] [id number]  PRIMARY PROVIDER:  Maury Dus, MD  PRIMARY REASON FOR VISIT:  1. Malignant neoplasm of upper-outer quadrant of left breast in female, estrogen receptor positive ([place])   2. Family history of leukemia   3. Family history of colon cancer      HISTORY OF PRESENT ILLNESS:   Ms. Plazola, a 59 y.o. female, was seen for a [facility] cancer genetics consultation at the request of Dr. Lindi Adie due to a personal and family history of cancer.  Ms. Neukam presents to clinic today to discuss the possibility of a hereditary predisposition to cancer, genetic testing, and to further clarify her future cancer risks, as well as potential cancer risks for family members.   In 2021, at the age of 55, Ms. Tennyson was diagnosed with invasive ductal carcinoma, ER+/PR-/Her2-, of the left breast. The treatment plan includes lumpectomy (complete 04/22/20), Oncotype DX (score is 43), and antiestrogen therapy. Ms. Benway declined chemotherapy and radiation therapy.     CANCER HISTORY:  Oncology History  Malignant neoplasm of upper-outer quadrant of left breast in female, estrogen receptor positive ([place])  12/19/2019 Initial Diagnosis   Screening mammogram showed a possible mass and 2 asymmetries in the left breast. Diagnostic mammogram showed two masses 1.4cm apart in the left breast at the 2:30 position, 0.7cm and 1.1cm, and two likely cysts, a 0.5cm mass a the 3:00 position, and a 0.9cm mass at the 11:00 position. Biopsy showed IDC in both masses at the 2:30 position, grade 3, HER-2 equivocal by IHC (2+), negative by FISH, ER+ 80%, PR- 0%, KI67 90%.   12/19/2019 Oncotype testing   Oncotype DX score 43: Distant recurrence at 9 years 31%   12/24/2019 Cancer Staging   Staging form: Breast, AJCC 8th Edition - Clinical stage from 12/24/2019: Stage IB (cT1c, cN0, cM0, G3, ER+, PR-, HER2-) - Signed by Nicholas Lose, MD on  12/24/2019   04/22/2020 Surgery   Left lumpectomy: Multifocal IDC 1.5 cm, grade 3, margins negative, 0/1 lymph node negative, ER 80% weak, PR 0%, HER-2 negative, Ki-67 90%      RISK FACTORS:  Menarche was at age 53.  First live birth at age 70.  OCP use for approximately 7 years.  Ovaries intact: yes.  Hysterectomy: no.  Menopausal status: postmenopausal.  HRT use: 0 years. Colonoscopy: yes; 2019 - 3 polyps. Mammogram within the last year: yes. Any excessive radiation exposure in the past: no.   Past Medical History:  Diagnosis Date  . Anxiety   . Arthritis    neck and right hip  . Cancer ([place])    breast  . Carpal tunnel syndrome    right arm  . Family history of colon cancer   . Family history of leukemia   . History of kidney stones     Past Surgical History:  Procedure Laterality Date  . BREAST LUMPECTOMY WITH RADIOACTIVE SEED LOCALIZATION Left 04/22/2020   Procedure: LEFT BREAST LUMPECTOMY WITH RADIOACTIVE SEED LOCALIZATION TIMES TWO;  Surgeon: Alphonsa Overall, MD;  Location: [facility];  Service: General;  Laterality: Left;  LEFT PEC BLOCK  . CESAREAN SECTION    . COLONOSCOPY    . FRACTURE SURGERY     left hand 5th finger  . LITHOTRIPSY  2000  . polyp removal    . TUBAL LIGATION      Social History   Socioeconomic History  . Marital status: Married    Spouse name:  Not on file  . Number of children: Not on file  . Years of education: Not on file  . Highest education level: Not on file  Occupational History  . Not on file  Tobacco Use  . Smoking status: Former Smoker    Types: Cigarettes    Quit date: 12/23/1988    Years since quitting: 31.3  . Smokeless tobacco: Never Used  Vaping Use  . Vaping Use: Never used  Substance and Sexual Activity  . Alcohol use: Yes    Comment: one a day  . Drug use: Yes    Types: Marijuana    Comment: couple times a week  . Sexual activity: Not on file  Other Topics Concern  . Not on file  Social History Narrative  . Not  on file   Social Determinants of Health   Financial Resource Strain: Medium Risk  . Difficulty of Paying Living Expenses: Somewhat hard  Food Insecurity: No Food Insecurity  . Worried About Charity fundraiser in the Last Year: Never true  . Ran Out of Food in the Last Year: Never true  Transportation Needs: No Transportation Needs  . Lack of Transportation (Medical): No  . Lack of Transportation (Non-Medical): No  Physical Activity: Not on file  Stress: Not on file  Social Connections: Not on file     FAMILY HISTORY:  We obtained a detailed, 4-generation family history.  Significant diagnoses are listed below: Family History  Problem Relation Age of Onset  . Heart attack Paternal Grandmother   . Colon cancer Paternal Grandfather        dx unknown age  . Leukemia Paternal Aunt 48  . Cancer Paternal Uncle 29       unknown cancer type     Ms. Cedotal has one daughter (age 12) and one son (age 59). She had one full-brother who died at age 54, two maternal half-sisters (ages 46 and 12), two paternal half-sisters (ages 72 and 44), and one paternal half-brother (age 63). None of these relatives have had cancer.   Ms. Hosking mother is 76 and has not had cancer. Ms. Janeway had one maternal aunt who died at age 1 due to suicide. Her maternal grandmother died at age 51, and she does not have any information about her maternal grandfather.   Ms. Colombe father died at age 85 and did not have cancer. She had four paternal uncles and two paternal aunts. One uncle died at age 3 from cancer (type unknown to patient). One aunt died at age 42 from leukemia. The other aunt died younger than 6 from suicide. Her paternal grandmother died at age 35 from a massive heart attack, and her paternal grandfather died older than 34 from colon cancer.  Ms. Hendrie is unaware of previous family history of genetic testing for hereditary cancer risks. Patient's ancestors are of unkonwn descent. There  is no reported Ashkenazi Jewish ancestry. There is no known consanguinity.  GENETIC COUNSELING ASSESSMENT: Ms. Carver is a 59 y.o. female with a personal history of breast cancer and a family history of leukemia and colon cancer, which is somewhat suggestive of a hereditary cancer syndrome and predisposition to cancer. We, therefore, discussed and recommended the following at today's visit.   DISCUSSION: We discussed that approximately 5-10% of breast cancer is hereditary, with most cases associated with the BRCA1 and BRCA2 genes. There are other genes that can be associated with hereditary breast cancer syndromes. These include ATM, CHEK2, PALB2, etc. We  discussed that testing is beneficial for several reasons, including knowing about other cancer risks, identifying potential screening and risk-reduction options that may be appropriate, and to understand if other family members could be at risk for cancer and allow them to undergo genetic testing.   We reviewed the characteristics, features and inheritance patterns of hereditary cancer syndromes. We also discussed genetic testing, including the appropriate family members to test, the process of testing, insurance coverage and turn-around-time for results. We discussed the implications of a negative, positive and/or variant of uncertain significant result. We recommended Ms. Almanza pursue genetic testing for the [facility] CancerNext-Expanded + RNAinsight gene panel.   The CancerNext-Expanded + RNAinsight gene panel offered by [employer] and includes sequencing and rearrangement analysis for the following 77 genes: AIP, ALK, APC, ATM, AXIN2, BAP1, BARD1, BLM, BMPR1A, BRCA1, BRCA2, BRIP1, CDC73, CDH1, CDK4, CDKN1B, CDKN2A, CHEK2, CTNNA1, DICER1, FANCC, FH, FLCN, GALNT12, KIF1B, LZTR1, MAX, MEN1, MET, MLH1, MSH2, MSH3, MSH6, MUTYH, NBN, NF1, NF2, NTHL1, PALB2, PHOX2B, PMS2, POT1, PRKAR1A, PTCH1, PTEN, RAD51C, RAD51D, RB1, RECQL, RET, SDHA, SDHAF2, SDHB,  SDHC, SDHD, SMAD4, SMARCA4, SMARCB1, SMARCE1, STK11, SUFU, TMEM127, TP53, TSC1, TSC2, VHL and XRCC2 (sequencing and deletion/duplication); EGFR, EGLN1, HOXB13, KIT, MITF, PDGFRA, POLD1 and POLE (sequencing only); EPCAM and GREM1 (deletion/duplication only). RNA data is routinely analyzed for use in variant interpretation for all genes.   Based on Ms. Ola's personal and family history of cancer, she meets medical criteria (through the [facility]) for genetic testing. Despite that she meets criteria, there may still be an out of pocket cost. Based on the billing policy of [facility], her out of pocket cost should be no more than $100.  PLAN: After considering the risks, benefits, and limitations, Ms. Madonia provided informed consent to pursue genetic testing and the blood sample was sent to [facility] for analysis of the CancerNext-Expanded + RNAinsight. Results should be available within approximately two-three weeks' time, at which point they will be disclosed by telephone to Ms. Lackey, as will any additional recommendations warranted by these results. Ms. Prunty will receive a summary of her genetic counseling visit and a copy of her results once available. This information will also be available in Epic.   Ms. Rashad questions were answered to her satisfaction today. Our contact information was provided should additional questions or concerns arise. Thank you for the referral and allowing [place] to share in the care of your patient.   Clint Guy, [place], [facility] Licensed, Certified Dispensing [email or web address] Phone: [phone]  The patient was seen for a total of 40 minutes in face-to-face genetic counseling.  This patient was discussed with Drs. Magrinat, Lindi Adie and/or Burr Medico who agrees with the above.    _______________________________________________________________________ For Office Staff:  Number of people involved in  session: 1 Was an Intern/ student involved with case: no

## 2020-05-10 ENCOUNTER — Encounter: Payer: Self-pay | Admitting: Physical Therapy

## 2020-05-20 DIAGNOSIS — Z1501 Genetic susceptibility to malignant neoplasm of breast: Secondary | ICD-10-CM | POA: Insufficient documentation

## 2020-05-20 DIAGNOSIS — Z1379 Encounter for other screening for genetic and chromosomal anomalies: Secondary | ICD-10-CM | POA: Insufficient documentation

## 2020-05-25 ENCOUNTER — Ambulatory Visit: Payer: Self-pay | Admitting: Genetic Counselor

## 2020-05-25 ENCOUNTER — Telehealth: Payer: Self-pay | Admitting: Genetic Counselor

## 2020-05-25 DIAGNOSIS — Z1379 Encounter for other screening for genetic and chromosomal anomalies: Secondary | ICD-10-CM

## 2020-05-25 DIAGNOSIS — Z1501 Genetic susceptibility to malignant neoplasm of breast: Secondary | ICD-10-CM

## 2020-05-25 NOTE — Telephone Encounter (Signed)
Revealed positive genetic test results. 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 Beverly Mcclain declined a follow-up genetic counseling appointment to review these results; therefore, we discussed the cancer risks and management recommendations for her and her family members. Encouraged her to call back with any additional questions or if she would like to schedule a follow-up genetic counseling appointment in the future.

## 2020-05-26 ENCOUNTER — Encounter: Payer: Self-pay | Admitting: Genetic Counselor

## 2020-05-26 NOTE — Progress Notes (Signed)
GENETIC TEST RESULTS   Patient Name: Dulce Martian Patient Age: 60 y.o. Encounter Date: 05/25/2020  Referring Provider: Nicholas Lose, MD [address] [place],  [place] [id number]      Ms. Igo was seen in the [facility] on 05/04/2020 due to a personal and family history of cancer and concern regarding a hereditary predisposition to cancer in the family. Please refer to the prior Genetics clinic note for more information regarding Ms. Requejo's medical and family histories and our assessment at the time.    FAMILY HISTORY:  We obtained a detailed, 4-generation family history.  Significant diagnoses are listed below:  Family History  Problem Relation Age of Onset  . Heart attack Paternal Grandmother   . Colon cancer Paternal Grandfather        dx unknown age  . Leukemia Paternal Aunt 25  . Cancer Paternal Uncle 72       unknown cancer type   Ms. Sukhu has one daughter (age 51) and one son (age 72). She had one full-brother who died at age 82, two maternal half-sisters (ages 25 and 60), two paternal half-sisters (ages 53 and 57), and one paternal half-brother (age 66). None of these relatives have had cancer.   Ms. Roma mother is 80 and has not had cancer. Ms. Cumpton had one maternal aunt who died at age 58 due to suicide. Her maternal grandmother died at age 93, and she does not have any information about her maternal grandfather.   Ms. Macnair father died at age 36 and did not have cancer. She had four paternal uncles and two paternal aunts. One uncle died at age 20 from cancer (type unknown to patient). One aunt died at age 65 from leukemia. The other aunt died younger than 30 from suicide. Her paternal grandmother died at age 68 from a massive heart attack, and her paternal grandfather died older than 25 from colon cancer.  Ms. Deak is unaware of previous family history of genetic testing for hereditary cancer risks. Patient's  ancestors are of unkonwn descent. There is no reported Ashkenazi Jewish ancestry. There is no known consanguinity.   GENETIC TESTING:  Genetic testing reported out on 05/20/2020 through the [facility] + RNAinsight panel.  A single pathogenic variant was detected in the BRIP1 gene called [id number].    The [facility]Expanded + RNAinsight gene panel offered by [employer] and includes sequencing and rearrangement analysis for the following 77 genes:  AIP, ALK, APC, ATM, AXIN2, BAP1, BARD1, BLM, BMPR1A, BRCA1, BRCA2, BRIP1, CDC73, CDH1, CDK4, CDKN1B, CDKN2A, CHEK2, CTNNA1, DICER1, FANCC, FH, FLCN, GALNT12, KIF1B, LZTR1, MAX, MEN1, MET, MLH1, MSH2, MSH3, MSH6, MUTYH, NBN, NF1, NF2, NTHL1, PALB2, PHOX2B, PMS2, POT1, PRKAR1A, PTCH1, PTEN, RAD51C, RAD51D, RB1, RECQL, RET, SDHA, SDHAF2, SDHB, SDHC, SDHD, SMAD4, SMARCA4, SMARCB1, SMARCE1, STK11, SUFU, TMEM127, TP53, TSC1, TSC2, VHL and XRCC2 (sequencing and deletion/duplication); EGFR, EGLN1, HOXB13, KIT, MITF, PDGFRA, POLD1 and POLE (sequencing only); EPCAM and GREM1 (deletion/duplication only). RNA data is routinely analyzed for use in variant interpretation for all genes. The test report will be scanned into EPIC and located under the Molecular Pathology section of the Results Review tab.  A portion of the result report is included below for reference.       BRIP1 CANCER RISKS & RECOMMENDATIONS:  We discussed that women who are carriers of a single pathogenic variant in the BRIP1 gene have an increased risk of ovarian cancer, and possibly breast cancer. The risk for ovarian cancer in  these women is estimated to be up to 9.1% (compared to the general population ovarian cancer risk of approximately 1.3%). There is preliminary evidence supporting a correlation with BRIP1 and predisposition to breast cancer; however, the available evidence is insufficient to make a determination regarding this relationship. An individual with a BRIP1  pathogenic variant will not necessarily develop cancer in their lifetime, but the risk for cancer is increased over the general population risk.    Based on current understanding, men don't appear to have increased cancer risks due to BRIP1 mutations. However, men can carry the mutation and pass it on to their children. Additionally, information regarding female cancer risk may change as more research is done to understand BRIP1 mutations.   Ovarian Cancer Management:  The National Comprehensive Cancer Network (NCCN) recommends consideration of prophylactic salpingo-oophorectomy (surgical removal of the ovaries and fallopian tubes) for women with a pathogenic variant in BRIP1 after childbearing is complete. The current evidence is insufficient to make a firm recommendation as to the optimal age for this procedure. However, based on the current, limited evidence base, a discussion about surgery should be held around 63-82 years of age or earlier based on a specific family history of early-onset ovarian cancer (Artist. Genetic/Familial High-Risk Assessment: Breast, Ovarian, and Pancreatic; Version 1.2022). Women electing to defer prophylactic oophorectomy can consider screening with serum CA-125 and transvaginal ultrasound; however, data do not support such screening and it should not be a substitute for preventive surgery.   Ms. Lair sees Dr. Lindi Adie for her oncology care. Ms. Leys will discuss this information with further with this provider.    Breast Cancer Management:  The current NCCN guidelines do not recommend additional breast cancer screening for individuals with a single pathogenic BRIP1 variant beyond what is recommended for the general population. However, they caution that cancer screening should ultimately be guided by personal and family history (Artist. Genetic/Familial High-Risk Assessment: Breast, Ovarian, and Pancreatic;  Version 1.2022).   An individual's cancer risk and medical management are not determined by genetic test results alone. Overall cancer risk assessment incorporates additional factors, including personal medical history, family history, and any available genetic information that may result in a personalized plan for cancer prevention and surveillance.   INHERITANCE & RISK FOR FAMILY MEMBERS:  Hereditary predisposition to cancer due to a single pathogenic variant in the BRIP1 gene has autosomal dominant inheritance. This means that an individual with a pathogenic variant has a 50% chance of passing the condition on to their offspring. Once such a variant is detected in an individual, it is possible to identify at-risk relatives who can pursue testing for this specific familial variant. Many cases are inherited from a parent, but some cases can occur spontaneously (i.e., an individual with a pathogenic variant has parents who do not have it).    It is important that all of Ms. Parlier's relatives (both men and women) know of the presence of this gene mutation. Genetic testing can sort out who in the family is at risk and who is not. We would be happy to help meet with and coordinate genetic testing for any relative that is interested.   Ms. Arambula children have a 50% chance to have inherited the mutation found in her. Her half-siblings have a 25% chance to carry the BRIP1 mutation. We recommend they have genetic testing for this same mutation, as identifying the presence of this mutation would allow them to also take advantage of risk-reducing  measures.   Additionally, individuals with a pathogenic variant in BRIP1 are carriers of Fanconi anemia type J. Fanconi anemia is an autosomal recessive disorder that is characterized by bone marrow failure and variable presentation of anomalies, including short stature, abnormal skin pigmentation, abnormal thumbs, malformations of the skeletal and central nervous  systems, and developmental delay (PMID: 3437357, 89784784). Risks for leukemia and early onset solid tumors are significantly elevated (PMID: 12820813, 88719597, 47185501). For there to be a risk of Fanconi anemia in offspring, both parents would each have to have a single pathogenic variant in [place]; in such a case, the risk of having an affected child is 25%.  Lastly, we discussed that our knowledge of cancer risks related to BRIP1 mutations will continue to evolve. We recommended that Ms. Poellnitz follow up with the genetics clinic annually so we can provide her with the most current information about BRIP1 and cancer risk, as well as with any changes to her family history (e.g. new cancer diagnoses, genetic test results).  Our contact number was provided. Ms. Demary questions were answered to her satisfaction, and she knows she is welcome to call us at anytime with additional questions or concerns.   Clint Guy, [place], [facility] Licensed, Certified Dispensing [email or web address] Phone: [phone]

## 2020-06-14 ENCOUNTER — Encounter: Payer: Self-pay | Admitting: Hematology and Oncology

## 2020-06-16 ENCOUNTER — Ambulatory Visit
Admission: RE | Admit: 2020-06-16 | Discharge: 2020-06-16 | Disposition: A | Discharge status: Home / Self Care | Payer: 59 | Source: Ambulatory Visit | Attending: Family Medicine | Admitting: Family Medicine

## 2020-06-16 ENCOUNTER — Other Ambulatory Visit: Payer: Self-pay | Admitting: Family Medicine

## 2020-06-16 DIAGNOSIS — R1030 Lower abdominal pain, unspecified: Secondary | ICD-10-CM

## 2020-06-16 DIAGNOSIS — R1031 Right lower quadrant pain: Secondary | ICD-10-CM

## 2020-06-16 IMAGING — CT CT ABD-PELV W/ CM
1 of 3 series · 13 of 32 positions shown, 19 images · IV contrast (APPLIED)
Comparison: [DATE]

CLINICAL DATA: Right lower quadrant pain, fever

EXAM:
CT ABDOMEN AND PELVIS WITH CONTRAST
TECHNIQUE: Multidetector CT imaging of the abdomen and pelvis was performed
using the standard protocol following bolus administration of
intravenous contrast.
CONTRAST:  100mL [2P] IOPAMIDOL ([2P]) INJECTION 61%

[Series 2: abd/pelvis w/cm · axial · 0.74mm/px · z∈[-460,-70]mm · 13 of 90 slices shown, 19 images]
[im 6/90  soft-tissue]
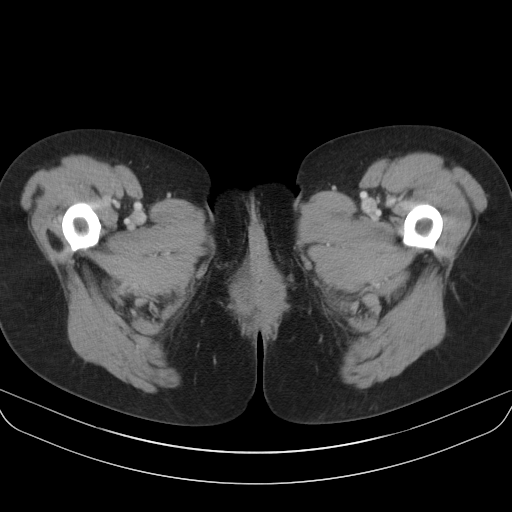
[im 6/90  bone]
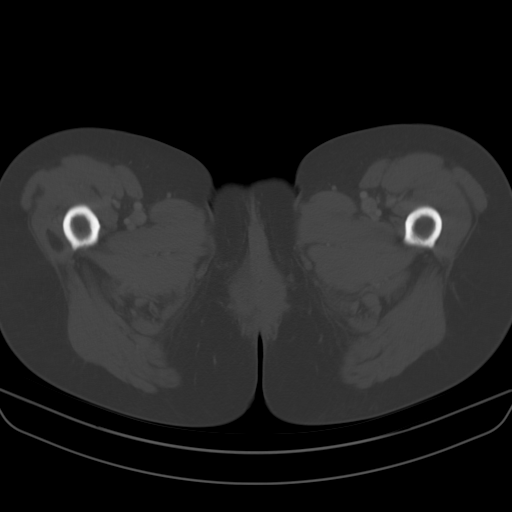
[im 12/90  soft-tissue]
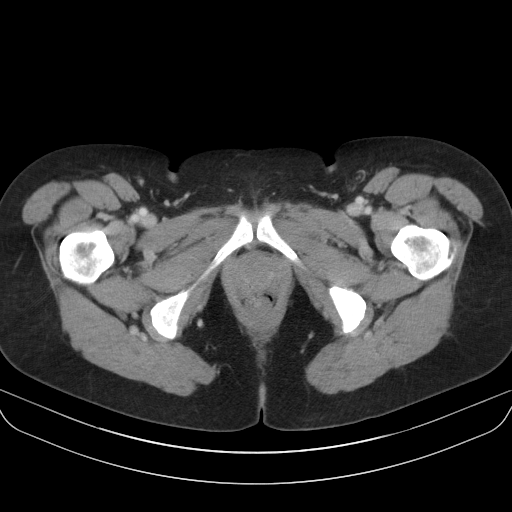
[im 18/90  soft-tissue]
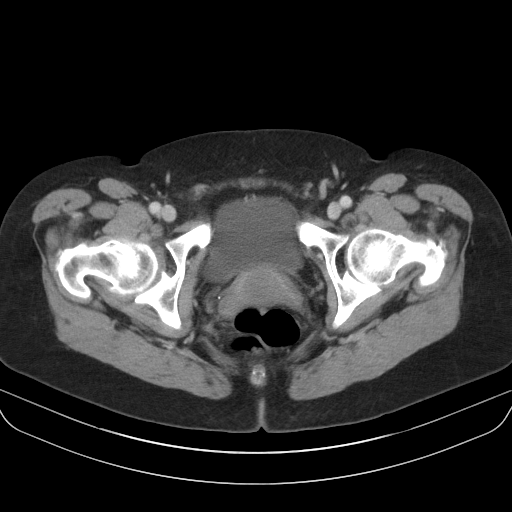
[im 24/90  soft-tissue]
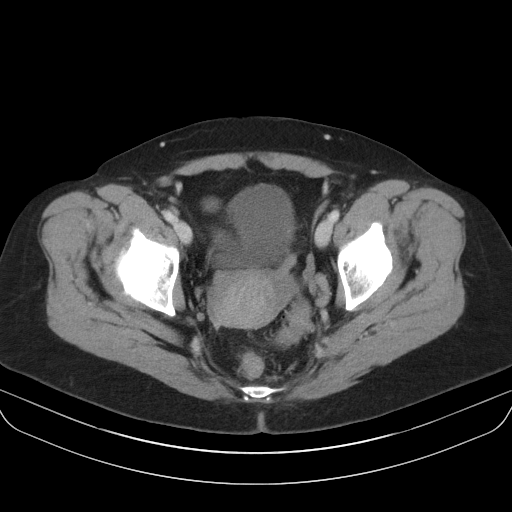
[im 30/90  soft-tissue]
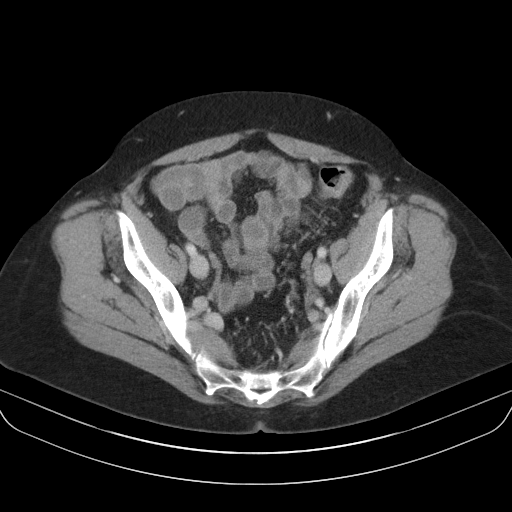
[im 36/90  soft-tissue]
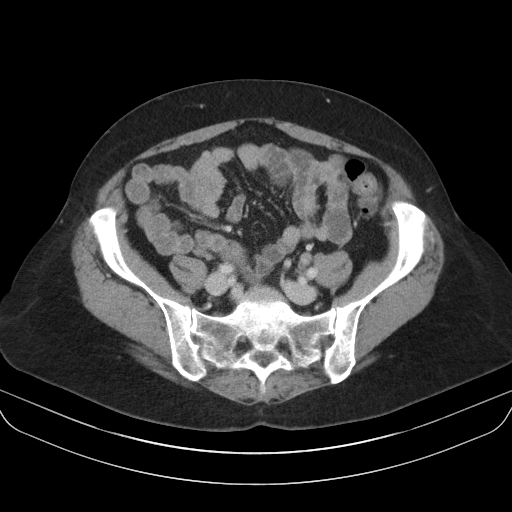
[im 48/90  soft-tissue]
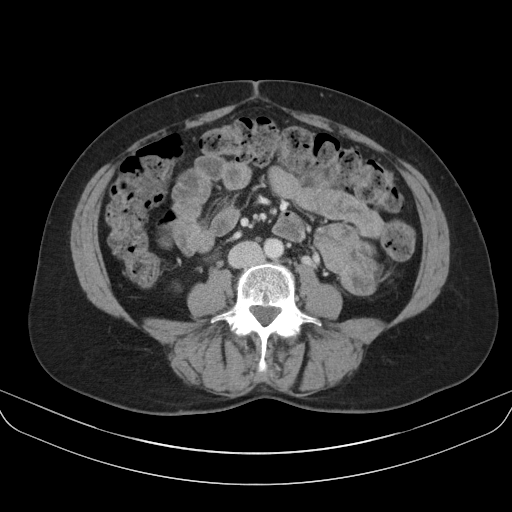
[im 54/90  soft-tissue]
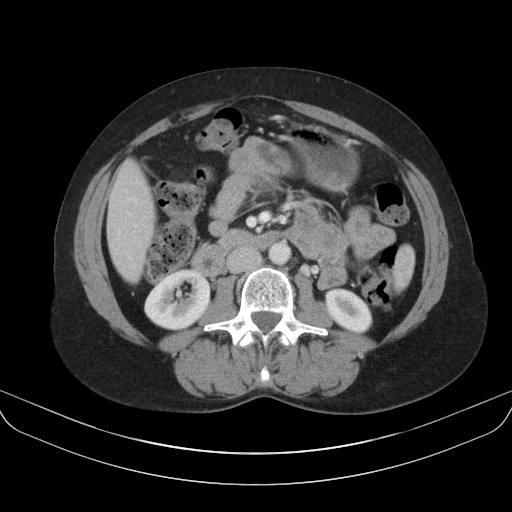
[im 60/90  soft-tissue]
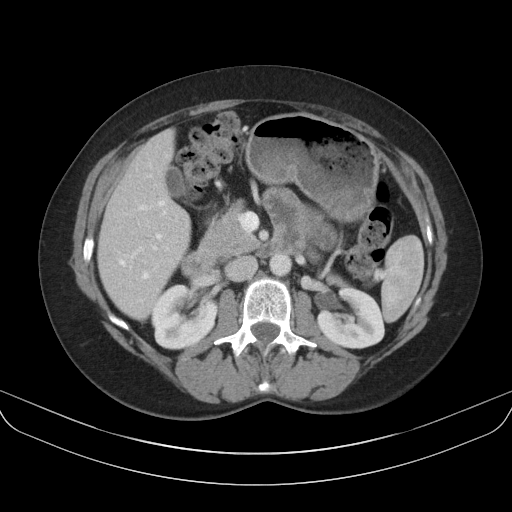
[im 60/90  bone]
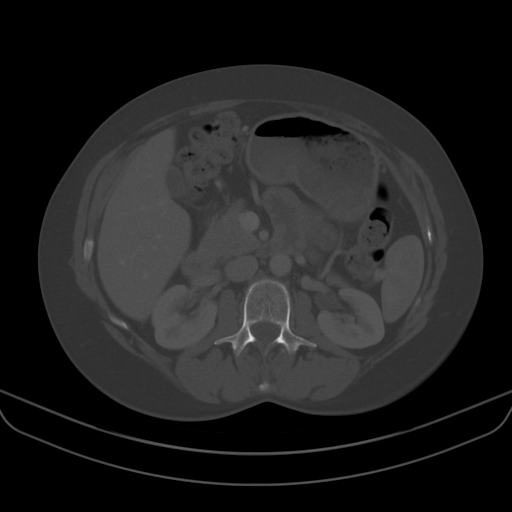
[im 66/90  soft-tissue]
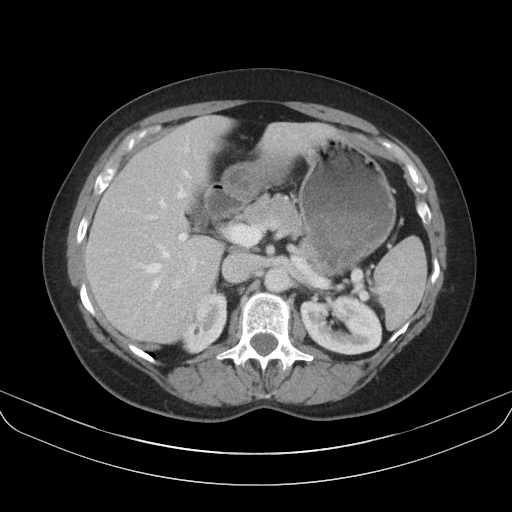
[im 66/90  lung]
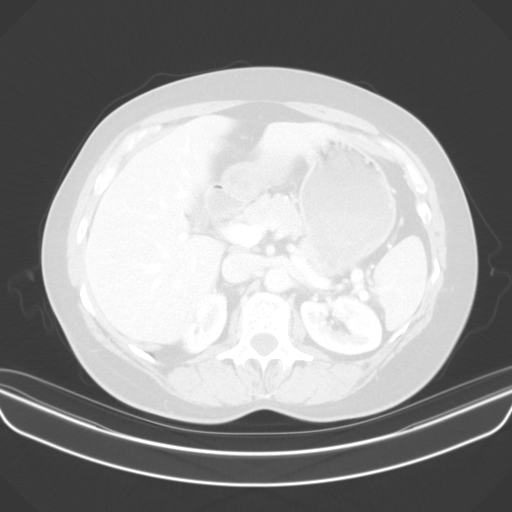
[im 72/90  soft-tissue]
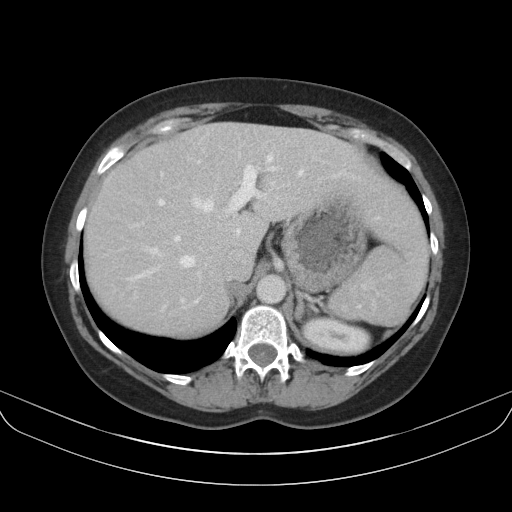
[im 72/90  lung]
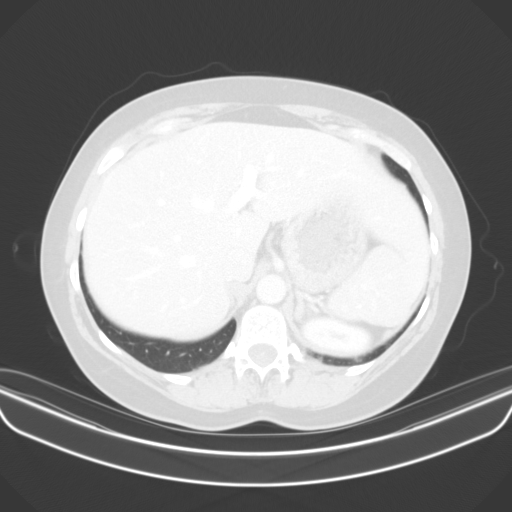
[im 78/90  soft-tissue]
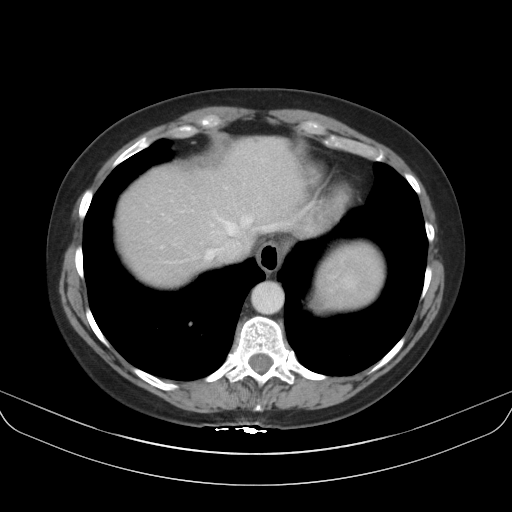
[im 78/90  lung]
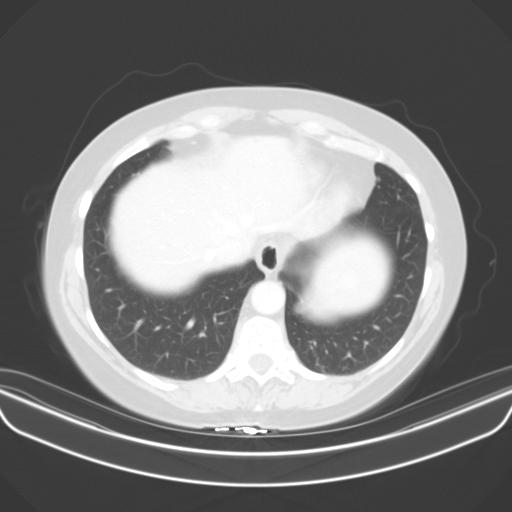
[im 84/90  soft-tissue]
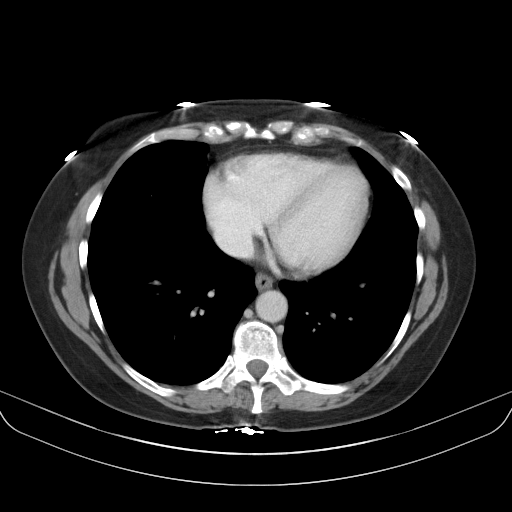
[im 84/90  lung]
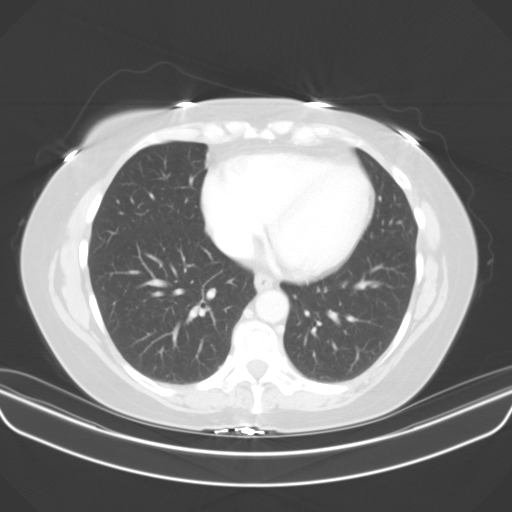

[13 of 32 positions shown; findings below may reference images not displayed]

FINDINGS: Lower chest: No acute abnormality.

Hepatobiliary: No focal liver abnormality is seen. No gallstones,
gallbladder wall thickening, or biliary dilatation.

Pancreas: Unremarkable. No pancreatic ductal dilatation or
surrounding inflammatory changes.

Spleen: Normal in size without focal abnormality.

Adrenals/Urinary Tract: Adrenal glands unremarkable. Stable 1.5 cm
right lower pole benign renal angiomyolipoma. No hydronephrosis.
Urinary bladder incompletely distended.

Stomach/Bowel: Stomach partially distended by ingested material. The
small bowel is nondilated. Right lower quadrant surgical clips.
Moderate colonic fecal material. Scattered sigmoid diverticula
without adjacent inflammatory change.

Vascular/Lymphatic: No significant vascular findings are present. No
enlarged abdominal or pelvic lymph nodes.

Reproductive: Uterus and bilateral adnexa are unremarkable.

Other: No ascites.  No free air.

Musculoskeletal: Lower lumbar facet DJD. Bilateral hip DJD, right
worse than left. Negative for fracture or worrisome bone lesion.
IMPRESSION: 1. No acute findings.
2. Sigmoid diverticulosis.
3. Stable benign right renal angiomyolipoma.

## 2020-06-16 MED ORDER — IOPAMIDOL (ISOVUE-300) INJECTION 61%
100.0000 mL | Freq: Once | INTRAVENOUS | Status: AC | PRN
  Administered 2020-06-16: 100 mL via INTRAVENOUS

## 2020-07-28 ENCOUNTER — Telehealth: Payer: Self-pay | Admitting: Adult Health

## 2020-07-28 NOTE — Progress Notes (Deleted)
SURVIVORSHIP VISIT:  BRIEF ONCOLOGIC HISTORY:  Oncology History  Malignant neoplasm of upper-outer quadrant of left breast in female, estrogen receptor positive (Beverly Mcclain)  12/19/2019 Initial Diagnosis   Screening mammogram showed a possible mass and 2 asymmetries in the left breast. Diagnostic mammogram showed two masses 1.4cm apart in the left breast at the 2:30 position, 0.7cm and 1.1cm, and two likely cysts, a 0.5cm mass a the 3:00 position, and a 0.9cm mass at the 11:00 position. Biopsy showed IDC in both masses at the 2:30 position, grade 3, HER-2 equivocal by IHC (2+), negative by FISH, ER+ 80%, PR- 0%, KI67 90%.   12/19/2019 Oncotype testing   Oncotype DX score 43: Distant recurrence at 9 years 31%   12/24/2019 Cancer Staging   Staging form: Breast, AJCC 8th Edition - Clinical stage from 12/24/2019: Stage IB (cT1c, cN0, cM0, G3, ER+, PR-, HER2-) - Signed by Nicholas Lose, MD on 12/24/2019   04/22/2020 Surgery   Left lumpectomy: Multifocal IDC 1.5 cm, grade 3, margins negative, 0/1 lymph node negative, ER 80% weak, PR 0%, HER-2 negative, Ki-67 90%   04/22/2020 Cancer Staging   Staging form: Breast, AJCC 8th Edition - Pathologic stage from 04/22/2020: Stage IA (pT1c, pN0, cM0, G3, ER+, PR-, HER2-) - Signed by Gardenia Phlegm, NP on 07/28/2020 Stage prefix: Initial diagnosis Histologic grading system: 3 grade system   04/26/2020 -  Anti-estrogen oral therapy   Anastrozole daily   05/20/2020 Genetic Testing   Positive genetic testing:  A single, 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 Testing was completed through the CancerNext-Expanded + RNAinsight offered by [facility] laboratories. The report date is 05/20/2020.  The CancerNext-Expanded + RNAinsight gene panel offered by [employer] and includes sequencing and rearrangement analysis for the following 77 genes: AIP, ALK, APC, ATM, AXIN2, BAP1, BARD1, BLM, BMPR1A, BRCA1, BRCA2, BRIP1, CDC73,  CDH1, CDK4, CDKN1B, CDKN2A, CHEK2, CTNNA1, DICER1, FANCC, FH, FLCN, GALNT12, KIF1B, LZTR1, MAX, MEN1, MET, MLH1, MSH2, MSH3, MSH6, MUTYH, NBN, NF1, NF2, NTHL1, PALB2, PHOX2B, PMS2, POT1, PRKAR1A, PTCH1, PTEN, RAD51C, RAD51D, RB1, RECQL, RET, SDHA, SDHAF2, SDHB, SDHC, SDHD, SMAD4, SMARCA4, SMARCB1, SMARCE1, STK11, SUFU, TMEM127, TP53, TSC1, TSC2, VHL and XRCC2 (sequencing and deletion/duplication); EGFR, EGLN1, HOXB13, KIT, MITF, PDGFRA, POLD1 and POLE (sequencing only); EPCAM and GREM1 (deletion/duplication only). RNA data is routinely analyzed for use in variant interpretation for all genes.      INTERVAL HISTORY:  Beverly Mcclain to review her survivorship care plan detailing her treatment course for breast cancer, as well as monitoring long-term side effects of that treatment, education regarding health maintenance, screening, and overall wellness and health promotion.     Overall, Beverly Mcclain reports feeling quite well .  She was last seen in December, 2021 by Dr. Lindi Adie.  On 05/26/2019 she reviewed her genetic testing results with our genetic counselor and these revealed that she has a + BRIP1 mutation.     REVIEW OF SYSTEMS:  Review of Systems  Constitutional: Negative for appetite change, chills, fatigue, fever and unexpected weight change.  HENT:   Negative for hearing loss, lump/mass and trouble swallowing.   Eyes: Negative for eye problems and icterus.  Respiratory: Negative for chest tightness, cough and shortness of breath.   Cardiovascular: Negative for chest pain, leg swelling and palpitations.  Gastrointestinal: Negative for abdominal distention, abdominal pain, constipation, diarrhea, nausea and vomiting.  Endocrine: Negative for hot flashes.  Genitourinary: Negative for difficulty urinating.   Musculoskeletal: Negative for arthralgias.  Skin: Negative  for itching and rash.  Neurological: Negative for dizziness, extremity weakness, headaches and numbness.  Hematological: Negative  for adenopathy. Does not bruise/bleed easily.  Psychiatric/Behavioral: Negative for depression. The patient is not nervous/anxious.    Breast: Denies any new nodularity, masses, tenderness, nipple changes, or nipple discharge.      ONCOLOGY TREATMENT TEAM:  1. Surgeon:  Dr. Ezzard Standing at [facility] 2. Medical Oncologist: Dr. Pamelia Hoit  3. Radiation Oncologist: Dr. Mitzi Hansen    PAST MEDICAL/SURGICAL HISTORY:  Past Medical History:  Diagnosis Date  . Anxiety   . Arthritis    neck and right hip  . Cancer (HCC)    breast  . Carpal tunnel syndrome    right arm  . Family history of colon cancer   . Family history of leukemia   . History of kidney stones    Past Surgical History:  Procedure Laterality Date  . BREAST LUMPECTOMY WITH RADIOACTIVE SEED LOCALIZATION Left 04/22/2020   Procedure: LEFT BREAST LUMPECTOMY WITH RADIOACTIVE SEED LOCALIZATION TIMES TWO;  Surgeon: Ovidio Kin, MD;  Location: [facility] OR;  Service: General;  Laterality: Left;  LEFT PEC BLOCK  . CESAREAN SECTION    . COLONOSCOPY    . FRACTURE SURGERY     left hand 5th finger  . LITHOTRIPSY  2000  . polyp removal    . TUBAL LIGATION       ALLERGIES:  Allergies  Allergen Reactions  . Ibuprofen Palpitations  . Sulfa Antibiotics Rash  . Tuberculin Tests Itching, Rash and Swelling     CURRENT MEDICATIONS:  Outpatient Encounter Medications as of 07/29/2020  Medication Sig  . acetaminophen (TYLENOL) 500 MG tablet Take 1,000 mg by mouth every 6 (six) hours as needed for moderate pain.   Marland Kitchen ALPRAZolam (XANAX) 0.5 MG tablet Take 0.25 mg by mouth daily as needed for anxiety.  Marland Kitchen anastrozole (ARIMIDEX) 1 MG tablet Take 1 tablet (1 mg total) by mouth daily.  . APPLE CIDER VINEGAR PO Take 400 mg by mouth daily.  Marland Kitchen ascorbic acid (VITAMIN C) 1000 MG tablet Take 1,000 mg by mouth daily.   Marland Kitchen b complex vitamins capsule Take 1 capsule by mouth daily.  . cholecalciferol (VITAMIN D3) 25 MCG (1000 UNIT) tablet Take 1,000  Units by mouth daily.  . Cyanocobalamin (VITAMIN B-12) 5000 MCG SUBL Place 5,000 mcg under the tongue daily.   . diphenhydrAMINE (BENADRYL) 25 MG tablet Take 25 mg by mouth daily as needed for allergies.  Marland Kitchen HYDROcodone-acetaminophen (NORCO/VICODIN) 5-325 MG tablet Take 0.5 tablets by mouth 2 (two) times daily as needed for moderate pain.  Marland Kitchen HYDROcodone-acetaminophen (NORCO/VICODIN) 5-325 MG tablet Take 1 tablet by mouth every 6 (six) hours as needed for moderate pain.  . meloxicam (MOBIC) 15 MG tablet Take 15 mg by mouth daily as needed for pain.   Marland Kitchen sertraline (ZOLOFT) 100 MG tablet Take 50 mg by mouth daily.   . TURMERIC PO Take 1 capsule by mouth daily.   No facility-administered encounter medications on file as of 07/29/2020.     ONCOLOGIC FAMILY HISTORY:  Family History  Problem Relation Age of Onset  . Heart attack Paternal Grandmother   . Colon cancer Paternal Grandfather        dx unknown age  . Leukemia Paternal Aunt 105  . Cancer Paternal Uncle 110       unknown cancer type     GENETIC COUNSELING/TESTING: BRIP mutation  SOCIAL HISTORY:  Social History   Socioeconomic History  . Marital status:  Married    Spouse name: Not on file  . Number of children: Not on file  . Years of education: Not on file  . Highest education level: Not on file  Occupational History  . Not on file  Tobacco Use  . Smoking status: Former Smoker    Types: Cigarettes    Quit date: 12/23/1988    Years since quitting: 31.6  . Smokeless tobacco: Never Used  Vaping Use  . Vaping Use: Never used  Substance and Sexual Activity  . Alcohol use: Yes    Comment: one a day  . Drug use: Yes    Types: Marijuana    Comment: couple times a week  . Sexual activity: Not on file  Other Topics Concern  . Not on file  Social History Narrative  . Not on file   Social Determinants of Health   Financial Resource Strain: Medium Risk  . Difficulty of Paying Living Expenses: Somewhat hard  Food  Insecurity: No Food Insecurity  . Worried About Charity fundraiser in the Last Year: Never true  . Ran Out of Food in the Last Year: Never true  Transportation Needs: No Transportation Needs  . Lack of Transportation (Medical): No  . Lack of Transportation (Non-Medical): No  Physical Activity: Not on file  Stress: Not on file  Social Connections: Not on file  Intimate Partner Violence: Not on file     OBSERVATIONS/OBJECTIVE:  There were no vitals taken for this visit. GENERAL: Patient is a well appearing female in no acute distress HEENT:  Sclerae anicteric.  Oropharynx clear and moist. No ulcerations or evidence of oropharyngeal candidiasis. Neck is supple.  NODES:  No cervical, supraclavicular, or axillary lymphadenopathy palpated.  BREAST EXAM:  Left breast s/p lumpectomy, no sign of local recurrence, right breast benign LUNGS:  Clear to auscultation bilaterally.  No wheezes or rhonchi. HEART:  Regular rate and rhythm. No murmur appreciated. ABDOMEN:  Soft, nontender.  Positive, normoactive bowel sounds. No organomegaly palpated. MSK:  No focal spinal tenderness to palpation. Full range of motion bilaterally in the upper extremities. EXTREMITIES:  No peripheral edema.   SKIN:  Clear with no obvious rashes or skin changes. No nail dyscrasia. NEURO:  Nonfocal. Well oriented.  Appropriate affect.    LABORATORY DATA:  None for this visit.  DIAGNOSTIC IMAGING:  None for this visit.      ASSESSMENT AND PLAN:  Ms.. Mcclain is a pleasant 60 y.o. female with Stage IB left breast invasive ductal carcinoma, ER+/PR-/HER2- and oncotype DX score of 43, diagnosed in 11/2019, treated with lumpectomy, adjuvant radiation therapy, and anti-estrogen therapy with Anastrozole beginning in 04/2020.  She presents to the Survivorship Clinic for our initial meeting and routine follow-up post-completion of treatment for breast cancer.    1. Stage IB left breast cancer:  Beverly Mcclain is continuing  to recover from definitive treatment for breast cancer. She will follow-up with her medical oncologist, Dr. Lindi Adie in 6 months with history and physical exam per surveillance protocol.  She will continue her anti-estrogen therapy with Anastrozole. Thus far, she is tolerating the Anastrozole well, with minimal side effects. She was instructed to make Dr. Lindi Adie or myself aware if she begins to experience any worsening side effects of the medication and I could see her back in clinic to help manage those side effects, as needed. Her mammogram is due 11/2020; orders placed today. Today, a comprehensive survivorship care plan and treatment summary was reviewed with the patient  today detailing her breast cancer diagnosis, treatment course, potential late/long-term effects of treatment, appropriate follow-up care with recommendations for the future, and patient education resources.  A copy of this summary, along with a letter will be sent to the patient's primary care provider via mail/fax/In Basket message after today's visit.    #. Problem(s) at Visit______________  #. Bone health:  Given Beverly Mcclain's age/history of breast cancer and her current treatment regimen including anti-estrogen therapy with Anastrozole, she is at risk for bone demineralization.  Her last DEXA scan was ***, which showed ***.  In the meantime, she was encouraged to increase her consumption of foods rich in calcium, as well as increase her weight-bearing activities.  She was given education on specific activities to promote bone health.  #. Cancer screening:  Due to Beverly Mcclain's history and her age, she should receive screening for skin cancers, colon cancer, and gynecologic cancers.  The information and recommendations are listed on the patient's comprehensive care plan/treatment summary and were reviewed in detail with the patient.    #. Health maintenance and wellness promotion: Beverly Mcclain was encouraged to consume 5-7 servings of  fruits and vegetables per day. We reviewed the "Nutrition Rainbow" handout, as well as the handout "Take Control of Your Health and Reduce Your Cancer Risk" from the [employer] [place].  She was also encouraged to engage in moderate to vigorous exercise for 30 minutes per day most days of the week. We discussed the [employer] fitness program, which is designed for cancer survivors to help them become more physically fit after cancer treatments.  She was instructed to limit her alcohol consumption and continue to abstain from tobacco use/***was encouraged stop smoking.     #. Support services/counseling: It is not uncommon for this period of the patient's cancer care trajectory to be one of many emotions and stressors.  We discussed how this can be increasingly difficult during the times of quarantine and social distancing due to the COVID-19 pandemic.   She was given information regarding our available services and encouraged to contact me with any questions or for help enrolling in any of our support group/programs.    Follow up instructions:    -Return to cancer center ***  -Mammogram due in *** -Follow up with surgery *** -She is welcome to return back to the Survivorship Clinic at any time; no additional follow-up needed at this time.  -Consider referral back to survivorship as a long-term survivor for continued surveillance  The patient was provided an opportunity to ask questions and all were answered. The patient agreed with the plan and demonstrated an understanding of the instructions.    Total encounter time: *** minutes  Wilber Bihari, NP 07/28/20 10:03 AM Medical Oncology and Hematology [facility] [place], [place] [address] Tel. [phone]    Fax. [phone]  *Total Encounter Time as defined by the Centers for Medicare and Medicaid Services includes, in addition to the face-to-face time of a patient visit (documented in the note above)  non-face-to-face time: obtaining and reviewing outside history, ordering and reviewing medications, tests or procedures, care coordination (communications with other health care professionals or caregivers) and documentation in the medical record.

## 2020-07-28 NOTE — Telephone Encounter (Signed)
Returned call to cancel upcoming appointment. Patient opted to reschedule due to work. Rescheduled appointment and patient is aware of changes.

## 2020-07-29 ENCOUNTER — Inpatient Hospital Stay: Payer: 59 | Admitting: Adult Health

## 2020-08-06 ENCOUNTER — Encounter: Payer: Self-pay | Admitting: Adult Health

## 2020-08-06 ENCOUNTER — Other Ambulatory Visit: Payer: Self-pay

## 2020-08-06 ENCOUNTER — Inpatient Hospital Stay: Payer: 59 | Attending: Adult Health | Admitting: Adult Health

## 2020-08-06 VITALS — BP 108/56 | HR 62 | Temp 97.9°F | Resp 16 | Ht 62.0 in | Wt 154.8 lb

## 2020-08-06 DIAGNOSIS — Z806 Family history of leukemia: Secondary | ICD-10-CM | POA: Insufficient documentation

## 2020-08-06 DIAGNOSIS — C50412 Malignant neoplasm of upper-outer quadrant of left female breast: Secondary | ICD-10-CM | POA: Diagnosis not present

## 2020-08-06 DIAGNOSIS — Z79811 Long term (current) use of aromatase inhibitors: Secondary | ICD-10-CM | POA: Insufficient documentation

## 2020-08-06 DIAGNOSIS — Z170 Estrogen receptor positive status [ER+]: Secondary | ICD-10-CM | POA: Diagnosis not present

## 2020-08-06 DIAGNOSIS — Z87442 Personal history of urinary calculi: Secondary | ICD-10-CM | POA: Insufficient documentation

## 2020-08-06 DIAGNOSIS — Z800 Family history of malignant neoplasm of digestive organs: Secondary | ICD-10-CM | POA: Insufficient documentation

## 2020-08-06 DIAGNOSIS — Z1501 Genetic susceptibility to malignant neoplasm of breast: Secondary | ICD-10-CM | POA: Diagnosis not present

## 2020-08-06 DIAGNOSIS — Z87891 Personal history of nicotine dependence: Secondary | ICD-10-CM | POA: Insufficient documentation

## 2020-08-06 DIAGNOSIS — Z1589 Genetic susceptibility to other disease: Secondary | ICD-10-CM

## 2020-08-06 DIAGNOSIS — K5730 Diverticulosis of large intestine without perforation or abscess without bleeding: Secondary | ICD-10-CM | POA: Diagnosis not present

## 2020-08-06 DIAGNOSIS — F419 Anxiety disorder, unspecified: Secondary | ICD-10-CM | POA: Insufficient documentation

## 2020-08-06 DIAGNOSIS — E2839 Other primary ovarian failure: Secondary | ICD-10-CM

## 2020-08-06 DIAGNOSIS — D1771 Benign lipomatous neoplasm of kidney: Secondary | ICD-10-CM | POA: Insufficient documentation

## 2020-08-06 DIAGNOSIS — Z923 Personal history of irradiation: Secondary | ICD-10-CM | POA: Insufficient documentation

## 2020-08-06 DIAGNOSIS — N393 Stress incontinence (female) (male): Secondary | ICD-10-CM | POA: Insufficient documentation

## 2020-08-06 DIAGNOSIS — Z791 Long term (current) use of non-steroidal anti-inflammatories (NSAID): Secondary | ICD-10-CM | POA: Diagnosis not present

## 2020-08-06 NOTE — Progress Notes (Signed)
SURVIVORSHIP VISIT:   BRIEF ONCOLOGIC HISTORY:  Oncology History  Malignant neoplasm of upper-outer quadrant of left breast in female, estrogen receptor positive ([place])  12/19/2019 Initial Diagnosis   Screening mammogram showed a possible mass and 2 asymmetries in the left breast. Diagnostic mammogram showed two masses 1.4cm apart in the left breast at the 2:30 position, 0.7cm and 1.1cm, and two likely cysts, a 0.5cm mass a the 3:00 position, and a 0.9cm mass at the 11:00 position. Biopsy showed IDC in both masses at the 2:30 position, grade 3, HER-2 equivocal by IHC (2+), negative by FISH, ER+ 80%, PR- 0%, KI67 90%.   12/19/2019 Oncotype testing   Oncotype DX score 43: Distant recurrence at 9 years 31%   12/24/2019 Cancer Staging   Staging form: Breast, AJCC 8th Edition - Clinical stage from 12/24/2019: Stage IB (cT1c, cN0, cM0, G3, ER+, PR-, HER2-) - Signed by Nicholas Lose, MD on 12/24/2019   04/22/2020 Surgery   Left lumpectomy: Multifocal IDC 1.5 cm, grade 3, margins negative, 0/1 lymph node negative, ER 80% weak, PR 0%, HER-2 negative, Ki-67 90%   04/22/2020 Cancer Staging   Staging form: Breast, AJCC 8th Edition - Pathologic stage from 04/22/2020: Stage IA (pT1c, pN0, cM0, G3, ER+, PR-, HER2-) - Signed by Gardenia Phlegm, NP on 07/28/2020 Stage prefix: Initial diagnosis Histologic grading system: 3 grade system   04/26/2020 -  Anti-estrogen oral therapy   Anastrozole daily   05/20/2020 Genetic Testing   Positive genetic testing:  A single, 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 Testing was completed through the CancerNext-Expanded + RNAinsight offered by [facility] laboratories. The report date is 05/20/2020.  The CancerNext-Expanded + RNAinsight gene panel offered by [employer] and includes sequencing and rearrangement analysis for the following 77 genes: AIP, ALK, APC, ATM, AXIN2, BAP1, BARD1, BLM, BMPR1A, BRCA1, BRCA2, BRIP1, CDC73,  CDH1, CDK4, CDKN1B, CDKN2A, CHEK2, CTNNA1, DICER1, FANCC, FH, FLCN, GALNT12, KIF1B, LZTR1, MAX, MEN1, MET, MLH1, MSH2, MSH3, MSH6, MUTYH, NBN, NF1, NF2, NTHL1, PALB2, PHOX2B, PMS2, POT1, PRKAR1A, PTCH1, PTEN, RAD51C, RAD51D, RB1, RECQL, RET, SDHA, SDHAF2, SDHB, SDHC, SDHD, SMAD4, SMARCA4, SMARCB1, SMARCE1, STK11, SUFU, TMEM127, TP53, TSC1, TSC2, VHL and XRCC2 (sequencing and deletion/duplication); EGFR, EGLN1, HOXB13, KIT, MITF, PDGFRA, POLD1 and POLE (sequencing only); EPCAM and GREM1 (deletion/duplication only). RNA data is routinely analyzed for use in variant interpretation for all genes.      INTERVAL HISTORY:  Beverly Mcclain to review her survivorship care plan detailing her treatment course for breast cancer, as well as monitoring long-term side effects of that treatment, education regarding health maintenance, screening, and overall wellness and health promotion.     Overall, Beverly Mcclain reports feeling quite well.  After reading about the Anastrozole she decided not to take it.   She also had a BRIP mutation noted on genetic testing results.  She wants to know next steps for that.    REVIEW OF SYSTEMS:  Review of Systems  Constitutional: Negative for appetite change, chills, fatigue, fever and unexpected weight change.  HENT:   Negative for hearing loss, lump/mass and trouble swallowing.   Eyes: Negative for eye problems and icterus.  Respiratory: Negative for chest tightness, cough and shortness of breath.   Cardiovascular: Negative for chest pain, leg swelling and palpitations.  Gastrointestinal: Negative for abdominal distention, abdominal pain, constipation, diarrhea, nausea and vomiting.  Endocrine: Negative for hot flashes.  Genitourinary: Negative for difficulty urinating.   Musculoskeletal: Negative for arthralgias.  Skin: Negative for  itching and rash.  Neurological: Negative for dizziness, extremity weakness, headaches and numbness.  Hematological: Negative for adenopathy.  Does not bruise/bleed easily.  Psychiatric/Behavioral: Negative for depression. The patient is not nervous/anxious.    Breast: Denies any new nodularity, masses, tenderness, nipple changes, or nipple discharge.      ONCOLOGY TREATMENT TEAM:  1. Surgeon:  Dr.Newman at [facility] 2. Medical Oncologist: Dr. Lindi Adie  3. Radiation Oncologist: Dr. Lisbeth Renshaw    PAST MEDICAL/SURGICAL HISTORY:  Past Medical History:  Diagnosis Date  . Anxiety   . Arthritis    neck and right hip  . Cancer ([place])    breast  . Carpal tunnel syndrome    right arm  . Family history of colon cancer   . Family history of leukemia   . History of kidney stones    Past Surgical History:  Procedure Laterality Date  . BREAST LUMPECTOMY WITH RADIOACTIVE SEED LOCALIZATION Left 04/22/2020   Procedure: LEFT BREAST LUMPECTOMY WITH RADIOACTIVE SEED LOCALIZATION TIMES TWO;  Surgeon: Alphonsa Overall, MD;  Location: [facility];  Service: General;  Laterality: Left;  LEFT PEC BLOCK  . CESAREAN SECTION    . COLONOSCOPY    . FRACTURE SURGERY     left hand 5th finger  . LITHOTRIPSY  2000  . polyp removal    . TUBAL LIGATION       ALLERGIES:  Allergies  Allergen Reactions  . Ibuprofen Palpitations  . Sulfa Antibiotics Rash  . Tuberculin Tests Itching, Rash and Swelling     CURRENT MEDICATIONS:  Outpatient Encounter Medications as of 08/06/2020  Medication Sig  . acetaminophen (TYLENOL) 500 MG tablet Take 1,000 mg by mouth every 6 (six) hours as needed for moderate pain.   Marland Kitchen ALPRAZolam (XANAX) 0.5 MG tablet Take 0.25 mg by mouth daily as needed for anxiety.  . APPLE CIDER VINEGAR PO Take 400 mg by mouth daily.  Marland Kitchen ascorbic acid (VITAMIN C) 1000 MG tablet Take 1,000 mg by mouth daily.   Marland Kitchen b complex vitamins capsule Take 1 capsule by mouth daily.  . cholecalciferol (VITAMIN D3) 25 MCG (1000 UNIT) tablet Take 1,000 Units by mouth daily.  . diphenhydrAMINE (BENADRYL) 25 MG tablet Take 25 mg by mouth daily as needed  for allergies.  Marland Kitchen HYDROcodone-acetaminophen (NORCO/VICODIN) 5-325 MG tablet Take 0.5 tablets by mouth 2 (two) times daily as needed for moderate pain.  . meloxicam (MOBIC) 15 MG tablet Take 15 mg by mouth daily as needed for pain.   Marland Kitchen sertraline (ZOLOFT) 100 MG tablet Take 50 mg by mouth daily.   . TURMERIC PO Take 1 capsule by mouth daily.  Marland Kitchen amphetamine-dextroamphetamine (ADDERALL) 30 MG tablet TAKE 1/2 TABLET BY MOUTH EVERY DAY AT 7 AM, 12 NOON, AND 4 PM 30 DAYS  . anastrozole (ARIMIDEX) 1 MG tablet Take 1 tablet (1 mg total) by mouth daily.  . Cyanocobalamin (VITAMIN B-12) 5000 MCG SUBL Place 5,000 mcg under the tongue daily.   Marland Kitchen HYDROcodone-acetaminophen (NORCO/VICODIN) 5-325 MG tablet Take 1 tablet by mouth every 6 (six) hours as needed for moderate pain.   No facility-administered encounter medications on file as of 08/06/2020.     ONCOLOGIC FAMILY HISTORY:  Family History  Problem Relation Age of Onset  . Heart attack Paternal Grandmother   . Colon cancer Paternal Grandfather        dx unknown age  . Leukemia Paternal Aunt 59  . Cancer Paternal Uncle 60       unknown cancer type  GENETIC COUNSELING/TESTING: See above  SOCIAL HISTORY:  Social History   Socioeconomic History  . Marital status: Married    Spouse name: Not on file  . Number of children: Not on file  . Years of education: Not on file  . Highest education level: Not on file  Occupational History  . Not on file  Tobacco Use  . Smoking status: Former Smoker    Types: Cigarettes    Quit date: 12/23/1988    Years since quitting: 31.6  . Smokeless tobacco: Never Used  Vaping Use  . Vaping Use: Never used  Substance and Sexual Activity  . Alcohol use: Yes    Comment: one a day  . Drug use: Yes    Types: Marijuana    Comment: couple times a week  . Sexual activity: Not on file  Other Topics Concern  . Not on file  Social History Narrative  . Not on file   Social Determinants of Health    Financial Resource Strain: Medium Risk  . Difficulty of Paying Living Expenses: Somewhat hard  Food Insecurity: No Food Insecurity  . Worried About Charity fundraiser in the Last Year: Never true  . Ran Out of Food in the Last Year: Never true  Transportation Needs: No Transportation Needs  . Lack of Transportation (Medical): No  . Lack of Transportation (Non-Medical): No  Physical Activity: Not on file  Stress: Not on file  Social Connections: Not on file  Intimate Partner Violence: Not on file     OBSERVATIONS/OBJECTIVE:  BP (!) 108/56 (BP Location: Left Arm, Patient Position: Sitting)   Pulse 62   Temp 97.9 F (36.6 C) (Tympanic)   Resp 16   Ht _0  (1.575 m)   Wt 154 lb 12.8 oz (70.2 kg)   SpO2 99%   BMI 28.31 kg/m  GENERAL: Patient is a well appearing female in no acute distress HEENT:  Sclerae anicteric.  Oropharynx clear and moist. No ulcerations or evidence of oropharyngeal candidiasis. Neck is supple.  NODES:  No cervical, supraclavicular, or axillary lymphadenopathy palpated.  BREAST EXAM:  Deferred. LUNGS:  Clear to auscultation bilaterally.  No wheezes or rhonchi. HEART:  Regular rate and rhythm. No murmur appreciated. ABDOMEN:  Soft, nontender.  Positive, normoactive bowel sounds. No organomegaly palpated. MSK:  No focal spinal tenderness to palpation. Full range of motion bilaterally in the upper extremities. EXTREMITIES:  No peripheral edema.   SKIN:  Clear with no obvious rashes or skin changes. No nail dyscrasia. NEURO:  Nonfocal. Well oriented.  Appropriate affect.    LABORATORY DATA:  None for this visit.  DIAGNOSTIC IMAGING:  None for this visit.      ASSESSMENT AND PLAN:  Ms.. Mcclain is a pleasant 60 y.o. female with Stage IA left breast invasive ductal carcinoma, ER+/PR+/HER2-, diagnosed in 12/2019, treated with lumpectomy and anti-estrogen therapy with Anastrozole beginning in 03/2020.  Declined chemotherapy and radiation therapy.  She  presents to the Survivorship Clinic for our initial meeting and routine follow-up post-completion of treatment for breast cancer.    1. Stage IA left breast cancer:  Beverly Mcclain is continuing to recover from definitive treatment for breast cancer. She will follow-up with her medical oncologist, Dr. Lindi Adie in 6 months with history and physical exam per surveillance protocol.  She has opted to forego taking Anastrozole.  She understands her risk for breast cancer recurrence outside the breast is now between 50-60%.. Her mammogram is due 11/2020; orders placed today.  Today, a comprehensive survivorship care plan and treatment summary was reviewed with the patient today detailing her breast cancer diagnosis, treatment course, potential late/long-term effects of treatment, appropriate follow-up care with recommendations for the future, and patient education resources.  A copy of this summary, along with a letter will be sent to the patient's primary care provider via mail/fax/In Basket message after today's visit.    2. BRIP mutation: Reviewed with patient in detail.  She was referred to gyn-oncology for discussion of RRSO versus monitoring.  She will get annual breast MRI as well.    3. Bone health:  Given Beverly Mcclain's age/history of breast cancer, she is at risk for bone demineralization.  She has not undergone bone density testing and I ordered this for her today to be completed when she undergoes her mammogram. She was given education on specific activities to promote bone health.  4. Cancer screening:  Due to Beverly Mcclain's history and her age, she should receive screening for skin cancers, colon cancer, and gynecologic cancers.  The information and recommendations are listed on the patient's comprehensive care plan/treatment summary and were reviewed in detail with the patient.    5. Health maintenance and wellness promotion: Beverly Mcclain was encouraged to consume 5-7 servings of fruits and  vegetables per day. We reviewed the "Nutrition Rainbow" handout, as well as the handout "Take Control of Your Health and Reduce Your Cancer Risk" from the [place].  She was also encouraged to engage in moderate to vigorous exercise for 30 minutes per day most days of the week. We discussed the [employer] fitness program, which is designed for cancer survivors to help them become more physically fit after cancer treatments.  She was instructed to limit her alcohol consumption and continue to abstain from tobacco use.  .     6. Support services/counseling: It is not uncommon for this period of the patient's cancer care trajectory to be one of many emotions and stressors.  We discussed how this can be increasingly difficult during the times of quarantine and social distancing due to the COVID-19 pandemic.   She was given information regarding our available services and encouraged to contact me with any questions or for help enrolling in any of our support group/programs.    Follow up instructions:    -Return to cancer center 6 months for f/u with Dr. Lindi Adie  -Mammogram due in 11/2020 -Breast MRI 04/2021 -Bone density in 11/2020 -Referral to GYN oncology -Refer to dermatology -She is welcome to return back to the Survivorship Clinic at any time; no additional follow-up needed at this time.  -Consider referral back to survivorship as a long-term survivor for continued surveillance  The patient was provided an opportunity to ask questions and all were answered. The patient agreed with the plan and demonstrated an understanding of the instructions.   Total encounter time: 30 minutes*  Wilber Bihari, NP 08/06/20 9:42 AM Medical Oncology and Hematology [facility] [place], [place] [address] Tel. [phone]    Fax. [phone]  *Total Encounter Time as defined by the Centers for Medicare and Medicaid Services includes, in addition to the  face-to-face time of a patient visit (documented in the note above) non-face-to-face time: obtaining and reviewing outside history, ordering and reviewing medications, tests or procedures, care coordination (communications with other health care professionals or caregivers) and documentation in the medical record.

## 2020-08-09 ENCOUNTER — Telehealth: Payer: Self-pay | Admitting: Hematology and Oncology

## 2020-08-09 NOTE — Telephone Encounter (Signed)
Scheduled appt per 3/18 los. Pt aware.

## 2020-08-10 ENCOUNTER — Telehealth: Payer: Self-pay | Admitting: *Deleted

## 2020-08-10 NOTE — Telephone Encounter (Signed)
Called and scheduled the patient for a new patient appt on 3/25 at 9 am with Dr Berline Lopes; patient to arrive at 8:30 am. Patient aware of the address and phone number for the clinic

## 2020-08-12 ENCOUNTER — Telehealth: Payer: Self-pay | Admitting: Dermatology

## 2020-08-12 ENCOUNTER — Encounter: Payer: Self-pay | Admitting: Gynecologic Oncology

## 2020-08-12 NOTE — Telephone Encounter (Signed)
Patient returning our call to schedule an appointment. Pt scheduled with Dr Denna Haggard 12/08/20, told to arrive at 8am.

## 2020-08-12 NOTE — Patient Instructions (Addendum)
It was a pleasure meeting you today. We will have a phone visit in a couple of months to discuss possible planning for surgery. Surgery would involve removal of your fallopian tubes and ovaries.  I will release your CA-125 results (lab test done today) and your ultrasound in mychart.  Please call if you have any concerning pelvic symptoms or if you are ready to schedule surgery before our phone visit.

## 2020-08-12 NOTE — Telephone Encounter (Signed)
Referral attached to appointment

## 2020-08-13 ENCOUNTER — Encounter: Payer: Self-pay | Admitting: Gynecologic Oncology

## 2020-08-13 ENCOUNTER — Inpatient Hospital Stay: Payer: 59

## 2020-08-13 ENCOUNTER — Inpatient Hospital Stay (HOSPITAL_BASED_OUTPATIENT_CLINIC_OR_DEPARTMENT_OTHER): Payer: 59 | Admitting: Gynecologic Oncology

## 2020-08-13 ENCOUNTER — Other Ambulatory Visit: Payer: Self-pay

## 2020-08-13 VITALS — BP 106/79 | HR 70 | Temp 97.2°F | Resp 20 | Wt 155.0 lb

## 2020-08-13 DIAGNOSIS — Z1501 Genetic susceptibility to malignant neoplasm of breast: Secondary | ICD-10-CM

## 2020-08-13 DIAGNOSIS — Z148 Genetic carrier of other disease: Secondary | ICD-10-CM | POA: Diagnosis not present

## 2020-08-13 DIAGNOSIS — Z1509 Genetic susceptibility to other malignant neoplasm: Secondary | ICD-10-CM

## 2020-08-13 DIAGNOSIS — Z1589 Genetic susceptibility to other disease: Secondary | ICD-10-CM

## 2020-08-13 DIAGNOSIS — Z1502 Genetic susceptibility to malignant neoplasm of ovary: Secondary | ICD-10-CM | POA: Diagnosis not present

## 2020-08-13 DIAGNOSIS — C50412 Malignant neoplasm of upper-outer quadrant of left female breast: Secondary | ICD-10-CM

## 2020-08-13 NOTE — Progress Notes (Signed)
GYNECOLOGIC ONCOLOGY NEW PATIENT CONSULTATION   Patient Name: Beverly Mcclain  Patient Age: 60 y.o. Date of Service: 08/13/2020 Referring Provider: Wilber Bihari, Dr. Nicholas Lose  Primary Care Provider: Maury Dus, MD Consulting Provider: Jeral Pinch, MD   Assessment/Plan:  Postmenopausal patient with estrogen receptor positive breast cancer and recently diagnosed BRIP1 mutation who presents for discussion regarding risk reduction.  The patient and I reviewed her genetic testing results.  This mutation has been found to cause an increased risk of epithelial ovarian cancer.  Lifetime risk associated with development of ovarian cancer is likely somewhere between 5 and 12%.  Given this increased risk, recommendations are for sideration of risk reducing surgery (bilateral salpingo-oophorectomy) beginning at 38-58-72 years of age.    We spent some time talking about the risks of surgery, goals of surgery, expectations and the perioperative period, and recovery time.  Plan would be to do minimally invasive surgery for removal of fallopian tubes and ovaries.  I do not see an indication for hysterectomy as there is no associated risk with uterine cancer in the setting of the BRIP1 mutation and there are not plans for tamoxifen therapy.  Given her livelihood, the patient would like to wait until a busier time of year, probably over the summer, to plan for scheduling surgery.  In the meantime, we discussed that there is no good screening test for ovarian cancer.  In the setting of a genetic mutation that increases the risk of ovarian cancer, we can use ultrasound and CA-125.  I discussed why these are not good screening tools but are the tools that we have available to use in the setting of close surveillance with known increased risk of ovarian cancer development.  We will plan to get a CA-125 today and will get the patient scheduled in the next few weeks for a pelvic ultrasound.  Bethany and I  will have a phone visit at the end of May to discuss planning for surgery.  A copy of this note was sent to the patient's referring provider.   60 minutes of total time was spent for this patient encounter, including preparation, face-to-face counseling with the patient and coordination of care, and documentation of the encounter.  Jeral Pinch, MD  Division of Gynecologic Oncology  Department of Obstetrics and Gynecology  [facility]  ___________________________________________  Chief Complaint: Chief Complaint  Patient presents with  . Monoallelic mutation of BRIP1 gene    History of Present Illness:  Beverly Mcclain is a 60 y.o. y.o. female who is seen in consultation at the request of Wilber Bihari, NP for an evaluation of treatment options and possible risk reducing surgery in the setting of a newly diagnosed BRIP1 mutation.  The patient's history is notable for ER+ breast cancer. 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  Antiestrogen therapy had been recommended in the form of anastrozole, but the patient has not taken this given risks and side effects that she researched on the Internet.  Overall, she reports feeling well.  She has some pain in her right hip related to arthritis and get flares of pain she thinks related to diverticulosis.  She was treated for diverticulitis a couple of months ago with a course of antibiotics.  She has a history of IBS as well as constipation.  She is not on regular meds for this and works on drinking a lot of water.  She endorses a good appetite without nausea or emesis.  She has some stress urinary incontinence for which she wears a panty liner.  She has some intermittent vaginal discharge and was treated with Flagyl within the last 6 to 12 months for bacterial vaginosis.  She also notes feeling "swollen" in her labial and vaginal area sometimes although notes that this does  not bother her.  She denies any vaginal bleeding.  Patient lives in [place] with her husband.  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  Her husband works at [employer].  She endorses alcohol intake in the form of a couple of drinks a night.  Treatment History: Oncology History  Malignant neoplasm of upper-outer quadrant of left breast in female, estrogen receptor positive ([place])  12/19/2019 Initial Diagnosis   Screening mammogram showed a possible mass and 2 asymmetries in the left breast. Diagnostic mammogram showed two masses 1.4cm apart in the left breast at the 2:30 position, 0.7cm and 1.1cm, and two likely cysts, a 0.5cm mass a the 3:00 position, and a 0.9cm mass at the 11:00 position. Biopsy showed IDC in both masses at the 2:30 position, grade 3, HER-2 equivocal by IHC (2+), negative by FISH, ER+ 80%, PR- 0%, KI67 90%.   12/19/2019 Oncotype testing   Oncotype DX score 43: Distant recurrence at 9 years 31%   12/24/2019 Cancer Staging   Staging form: Breast, AJCC 8th Edition - Clinical stage from 12/24/2019: Stage IB (cT1c, cN0, cM0, G3, ER+, PR-, HER2-) - Signed by Nicholas Lose, MD on 12/24/2019   04/22/2020 Surgery   Left lumpectomy: Multifocal IDC 1.5 cm, grade 3, margins negative, 0/1 lymph node negative, ER 80% weak, PR 0%, HER-2 negative, Ki-67 90%   04/22/2020 Cancer Staging   Staging form: Breast, AJCC 8th Edition - Pathologic stage from 04/22/2020: Stage IA (pT1c, pN0, cM0, G3, ER+, PR-, HER2-) - Signed by Gardenia Phlegm, NP on 07/28/2020 Stage prefix: Initial diagnosis Histologic grading system: 3 grade system   04/26/2020 -  Anti-estrogen oral therapy   Anastrozole daily   05/20/2020 Genetic Testing   Positive genetic testing:  A single, 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 Testing was completed through the CancerNext-Expanded + RNAinsight offered by [facility] laboratories. The report date is 05/20/2020.  The CancerNext-Expanded  + RNAinsight gene panel offered by [employer] and includes sequencing and rearrangement analysis for the following 77 genes: AIP, ALK, APC, ATM, AXIN2, BAP1, BARD1, BLM, BMPR1A, BRCA1, BRCA2, BRIP1, CDC73, CDH1, CDK4, CDKN1B, CDKN2A, CHEK2, CTNNA1, DICER1, FANCC, FH, FLCN, GALNT12, KIF1B, LZTR1, MAX, MEN1, MET, MLH1, MSH2, MSH3, MSH6, MUTYH, NBN, NF1, NF2, NTHL1, PALB2, PHOX2B, PMS2, POT1, PRKAR1A, PTCH1, PTEN, RAD51C, RAD51D, RB1, RECQL, RET, SDHA, SDHAF2, SDHB, SDHC, SDHD, SMAD4, SMARCA4, SMARCB1, SMARCE1, STK11, SUFU, TMEM127, TP53, TSC1, TSC2, VHL and XRCC2 (sequencing and deletion/duplication); EGFR, EGLN1, HOXB13, KIT, MITF, PDGFRA, POLD1 and POLE (sequencing only); EPCAM and GREM1 (deletion/duplication only). RNA data is routinely analyzed for use in variant interpretation for all genes.     PAST MEDICAL HISTORY:  Past Medical History:  Diagnosis Date  . Anxiety   . Arthritis    neck and right hip  . Cancer ([place])    breast  . Carpal tunnel syndrome    right arm  . Constipation   . Family history of colon cancer   . Family history of leukemia   . History of IBS   . History of kidney stones   . Hx of degenerative disc disease    Stage 2  . Mutation  in BRIP1 gene      PAST SURGICAL HISTORY:  Past Surgical History:  Procedure Laterality Date  . BREAST LUMPECTOMY WITH RADIOACTIVE SEED LOCALIZATION Left 04/22/2020   Procedure: LEFT BREAST LUMPECTOMY WITH RADIOACTIVE SEED LOCALIZATION TIMES TWO;  Surgeon: Alphonsa Overall, MD;  Location: [facility];  Service: General;  Laterality: Left;  LEFT PEC BLOCK  . CESAREAN SECTION    . COLONOSCOPY    . FRACTURE SURGERY     left hand 5th finger  . LITHOTRIPSY  2000  . polyp removal    . TUBAL LIGATION      OB/GYN HISTORY:  OB History  Gravida Para Term Preterm AB Living  3 2          SAB IAB Ectopic Multiple Live Births               # Outcome Date GA Lbr Len/2nd Weight Sex Delivery Anes PTL Lv  3 Gravida           2 Para           1  Para             No LMP recorded. Patient is postmenopausal.  Age at menarche: 53  Age at menopause: 26 Hx of HRT: no Hx of STDs: yes, history of Chlamydia Last pap: 2021 History of abnormal pap smears: denies  SCREENING STUDIES:  Last mammogram: 2021  Last colonoscopy: 2020  MEDICATIONS: Outpatient Encounter Medications as of 08/13/2020  Medication Sig  . acetaminophen (TYLENOL) 500 MG tablet Take 1,000 mg by mouth every 6 (six) hours as needed for moderate pain.   Marland Kitchen ALPRAZolam (XANAX) 0.5 MG tablet Take 0.25 mg by mouth daily as needed for anxiety.  Marland Kitchen amphetamine-dextroamphetamine (ADDERALL) 30 MG tablet TAKE 1/2 TABLET BY MOUTH EVERY DAY AT 7 AM, 12 NOON, AND 4 PM 30 DAYS  . APPLE CIDER VINEGAR PO Take 400 mg by mouth daily.  Marland Kitchen ascorbic acid (VITAMIN C) 1000 MG tablet Take 1,000 mg by mouth daily.   Marland Kitchen b complex vitamins capsule Take 1 capsule by mouth daily.  . cholecalciferol (VITAMIN D3) 25 MCG (1000 UNIT) tablet Take 1,000 Units by mouth daily.  . diphenhydrAMINE (BENADRYL) 25 MG tablet Take 25 mg by mouth daily as needed for allergies.  Marland Kitchen HYDROcodone-acetaminophen (NORCO/VICODIN) 5-325 MG tablet Take 0.5 tablets by mouth 2 (two) times daily as needed for moderate pain.  . meloxicam (MOBIC) 15 MG tablet Take 15 mg by mouth daily as needed for pain.   Marland Kitchen sertraline (ZOLOFT) 100 MG tablet Take 50 mg by mouth daily.   . TURMERIC PO Take 1 capsule by mouth daily.  . Zinc 50 MG CAPS Take 50 mg by mouth daily.  . [DISCONTINUED] HYDROcodone-acetaminophen (NORCO/VICODIN) 5-325 MG tablet Take 1 tablet by mouth every 6 (six) hours as needed for moderate pain.   No facility-administered encounter medications on file as of 08/13/2020.    ALLERGIES:  Allergies  Allergen Reactions  . Ibuprofen Palpitations  . Cephalexin Rash  . Tuberculin Tests Itching, Rash and Swelling     FAMILY HISTORY:  Family History  Problem Relation Age of Onset  . Heart attack Paternal Grandmother   .  Colon cancer Paternal Grandfather        dx unknown age  . Leukemia Paternal Aunt 61  . Cancer Paternal Uncle 56       unknown cancer type     SOCIAL HISTORY:  Social Connections: Not on file  REVIEW OF SYSTEMS:  + fatigue, ringing in ears, constipation, UI, vaginal discharge, joint pain, back pain. Denies appetite changes, fevers, chills, unexplained weight changes. Denies hearing loss, neck lumps or masses, mouth sores or voice changes. Denies cough or wheezing.  Denies shortness of breath. Denies chest pain or palpitations. Denies leg swelling. Denies abdominal distention, pain, blood in stools, diarrhea, nausea, vomiting, or early satiety. Denies pain with intercourse, dysuria, frequency, hematuria. Denies hot flashes, pelvic pain, vaginal bleeding.   Denies muscle pain/cramps. Denies itching, rash, or wounds. Denies dizziness, headaches, numbness or seizures. Denies swollen lymph nodes or glands, denies easy bruising or bleeding. Denies anxiety, depression, confusion, or decreased concentration.  Physical Exam:  Vital Signs for this encounter:  Blood pressure 106/79, pulse 70, temperature (!) 97.2 F (36.2 C), temperature source Tympanic, resp. rate 20, weight 155 lb (70.3 kg), SpO2 100 %. Body mass index is 28.35 kg/m. General: Alert, oriented, no acute distress.  HEENT: Normocephalic, atraumatic. Sclera anicteric.  Chest: Clear to auscultation bilaterally. No wheezes, rhonchi, or rales. Cardiovascular: Regular rate and rhythm, no murmurs, rubs, or gallops.  Abdomen: Normoactive bowel sounds. Soft, nondistended, nontender to palpation. No masses or hepatosplenomegaly appreciated. No palpable fluid wave. Well healed Pfannenstiel and laparoscopic incisions. Extremities: Grossly normal range of motion. Warm, well perfused. No edema bilaterally.  Skin: No rashes or lesions.  Lymphatics: No cervical, supraclavicular, or inguinal adenopathy.  GU:  Normal external female  genitalia. No lesions. No discharge or bleeding.             Bladder/urethra:  No lesions or masses, well supported bladder             Vagina: No lesions, well rugated.             Cervix: Normal appearing, no lesions.             Uterus: Small, mobile, no parametrial involvement or nodularity.             Adnexa: No masses appreaciated.  LABORATORY AND RADIOLOGIC DATA:  Outside medical records were reviewed to synthesize the above history, along with the history and physical obtained during the visit.   Lab Results  Component Value Date   WBC 6.4 04/20/2020   HGB 13.3 04/20/2020   HCT 41.1 04/20/2020   PLT 301 04/20/2020   GLUCOSE 91 04/07/2020   ALT 14 04/07/2020   AST 20 04/07/2020   NA 140 04/07/2020   K 4.2 04/07/2020   CL 106 04/07/2020   CREATININE 0.77 04/07/2020   BUN 18 04/07/2020   CO2 27 04/07/2020   CT A/P on 06/16/20: IMPRESSION: 1. No acute findings. 2. Sigmoid diverticulosis. 3. Stable benign right renal angiomyolipoma.

## 2020-08-14 LAB — CA 125: Cancer Antigen (CA) 125: 9.9 U/mL (ref 0.0–38.1)

## 2020-08-18 ENCOUNTER — Other Ambulatory Visit (HOSPITAL_COMMUNITY): Payer: 59

## 2020-08-20 ENCOUNTER — Ambulatory Visit (HOSPITAL_COMMUNITY): Payer: 59

## 2020-08-27 ENCOUNTER — Other Ambulatory Visit: Payer: Self-pay

## 2020-08-27 ENCOUNTER — Ambulatory Visit (HOSPITAL_COMMUNITY)
Admission: RE | Admit: 2020-08-27 | Discharge: 2020-08-27 | Disposition: A | Discharge status: Home / Self Care | Payer: 59 | Source: Ambulatory Visit | Attending: Gynecologic Oncology | Admitting: Gynecologic Oncology

## 2020-08-27 DIAGNOSIS — Z1501 Genetic susceptibility to malignant neoplasm of breast: Secondary | ICD-10-CM | POA: Insufficient documentation

## 2020-08-27 DIAGNOSIS — Z1589 Genetic susceptibility to other disease: Secondary | ICD-10-CM | POA: Insufficient documentation

## 2020-08-27 IMAGING — US US PELVIS COMPLETE WITH TRANSVAGINAL
2 series · 13 of 25 positions shown · non-contrast
Comparison: None

CLINICAL DATA: Monoallelic [[place]] gene mutation, postmenopausal



[Series 1: us pelvis complete with transvaginal · 12 of 127 slices shown (1 of 2)]
[im 1/127]
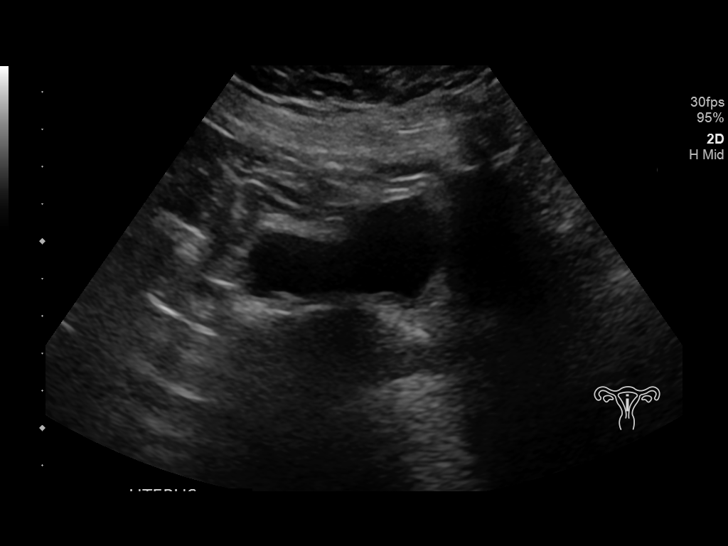
[im 11/127]
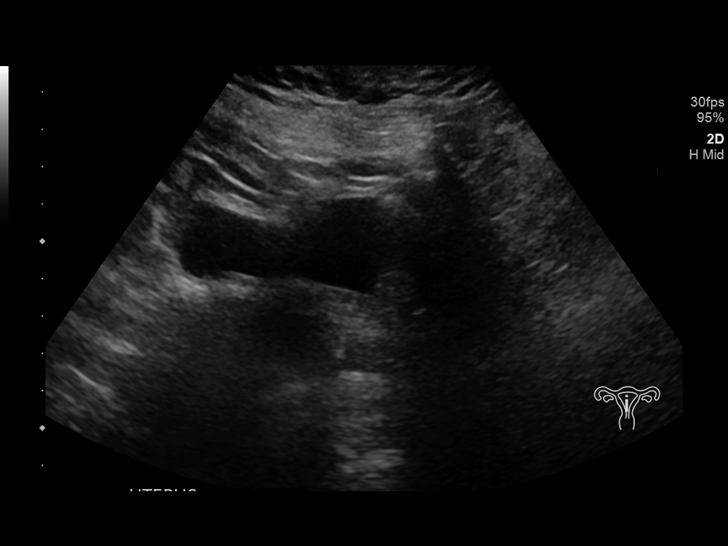
[im 22/127]
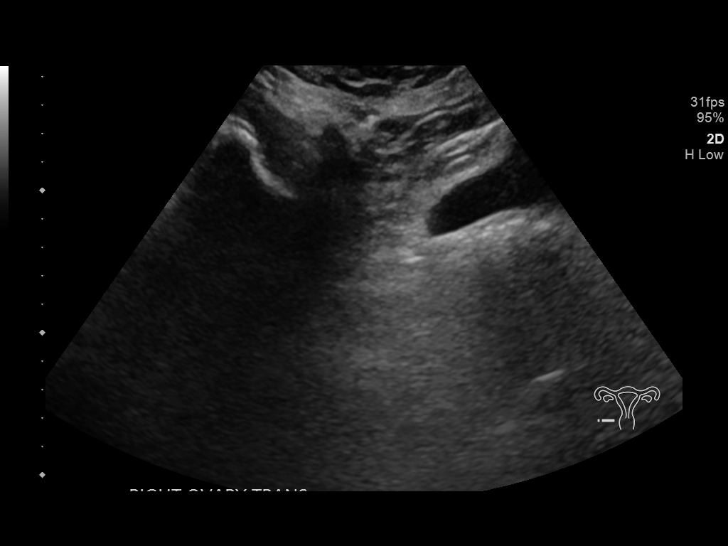
[im 33/127]
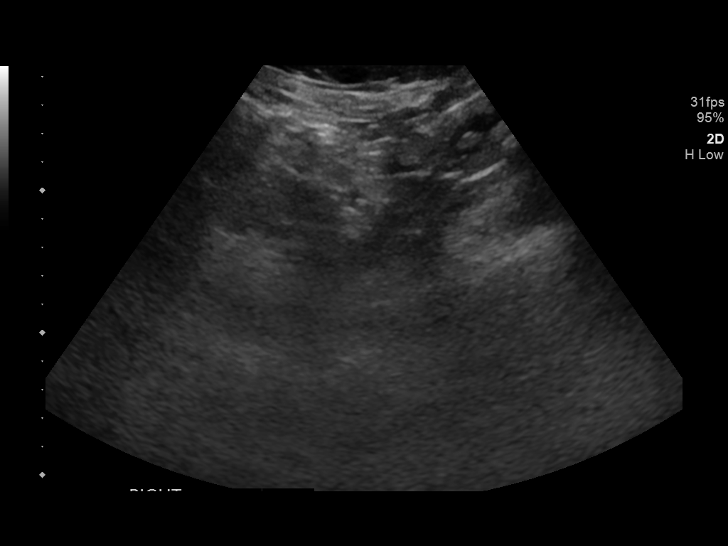
[im 44/127]
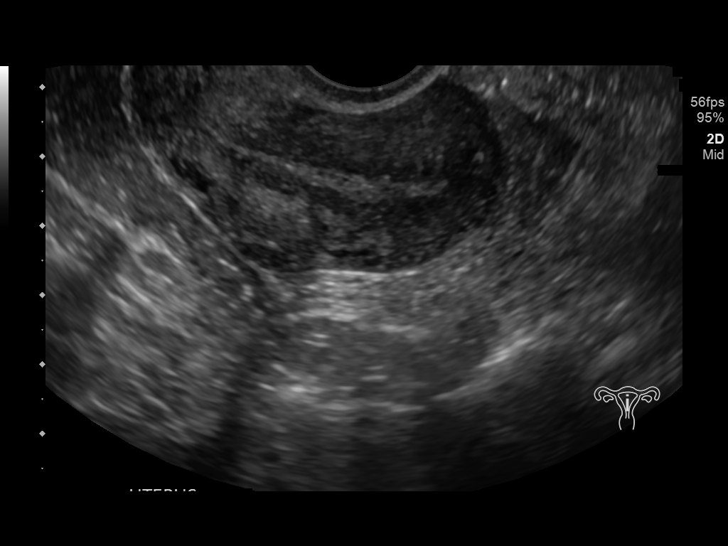
[im 55/127]
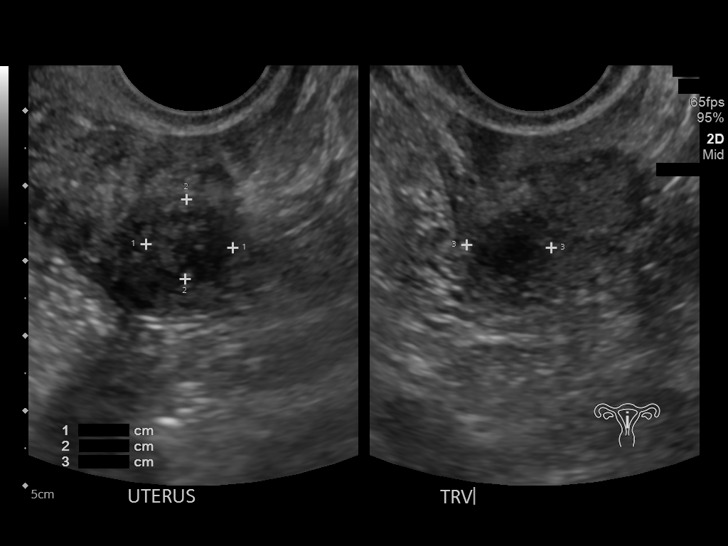
[im 66/127]
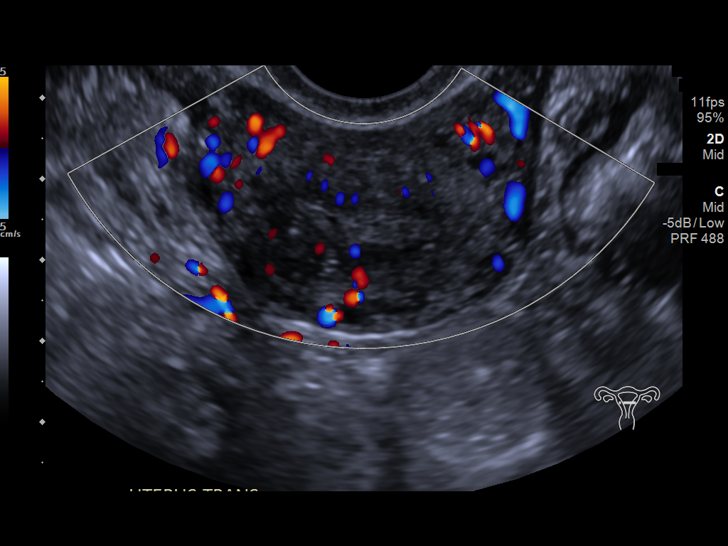
[im 77/127]
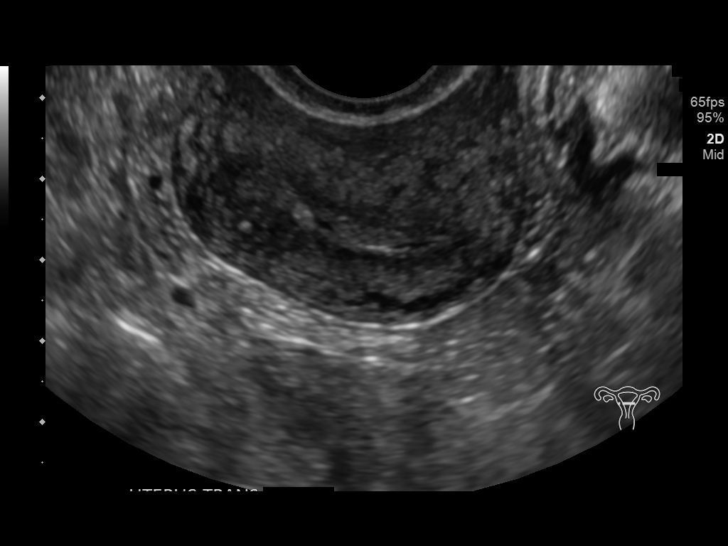
[im 88/127]
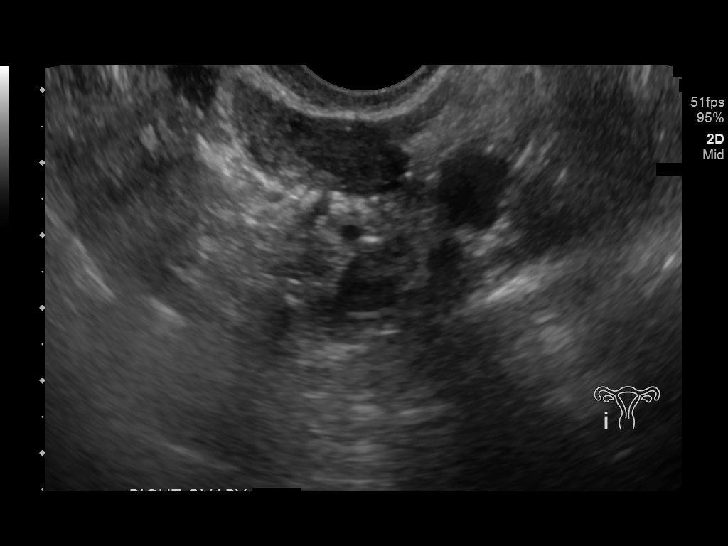
[im 99/127]
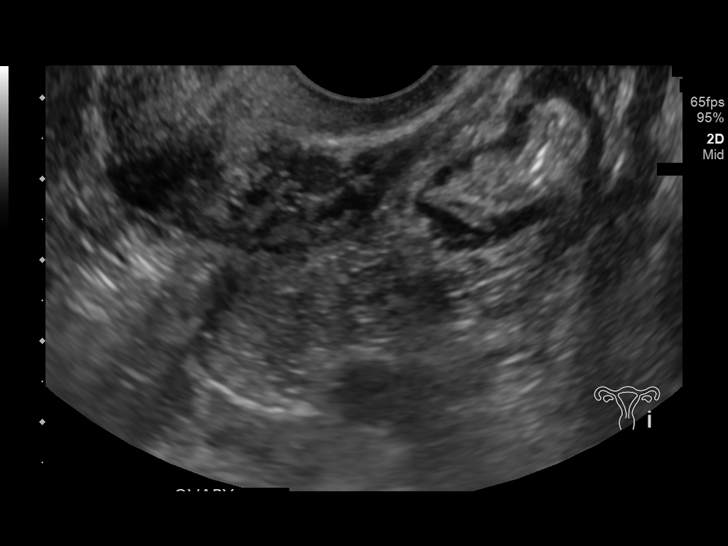
[im 110/127]
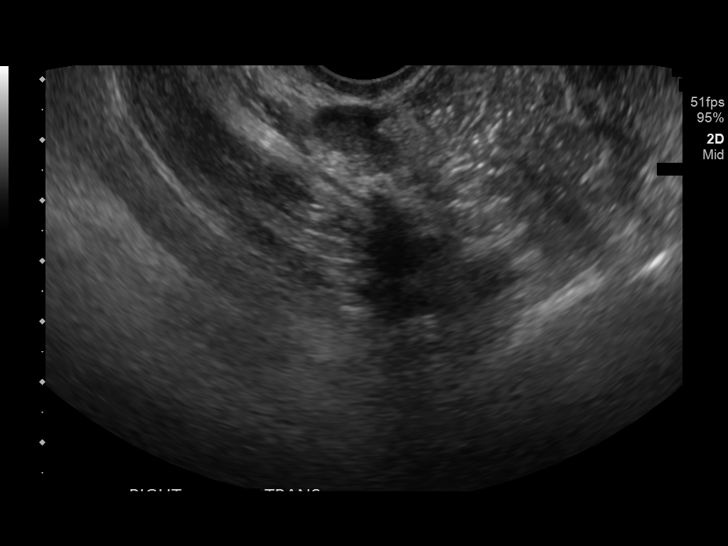
[im 121/127]
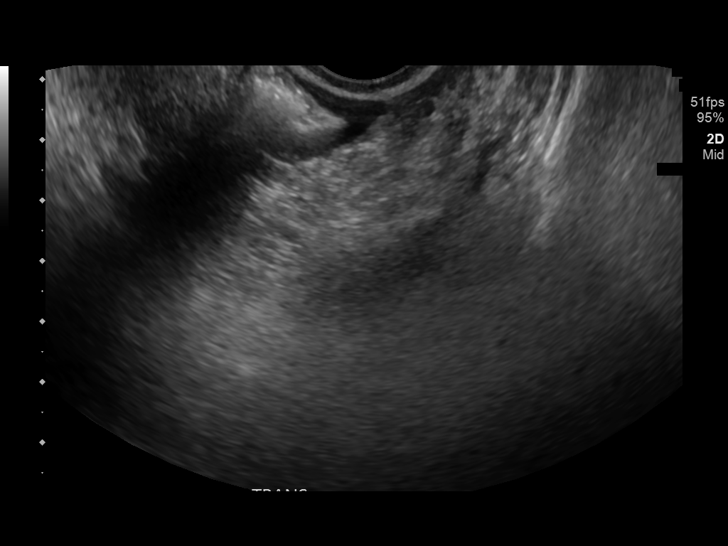

[Series 3: us pelvis complete with transvaginal · 1 of 2 slices shown (2 of 2)]
[im 1/2]
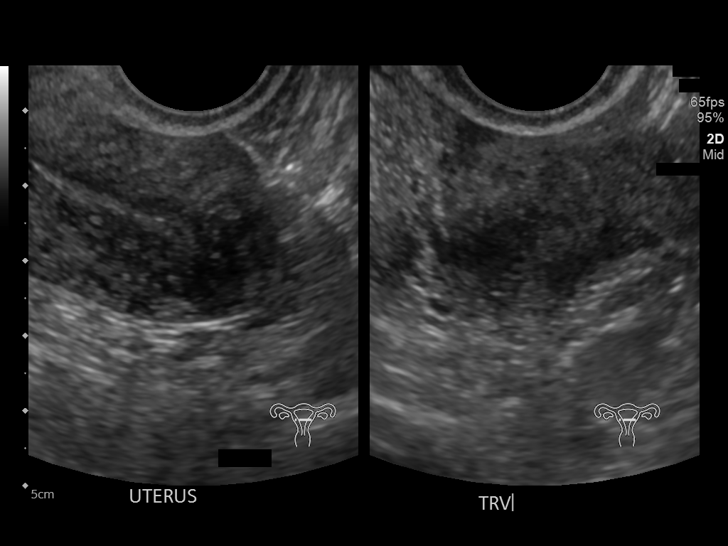

[13 of 25 positions shown; findings below may reference images not displayed]

FINDINGS: Uterus

Measurements: 5.5 x 2.3 x 4.3 cm = volume: 29 mL. Retroverted.
Slightly heterogeneous myometrium. Small uterine nodules are seen
consistent with leiomyomata. Largest of these measure 2.1 cm
anterior wall transmural and intramural RIGHT fundal 1.2 cm.

Endometrium

Thickness: 4 mm. Few tiny myometrial cysts, nonspecific. No
additional endometrial fluid or mass

Right ovary

Measurements: 2.7 x 1.0 x 1.1 cm = volume: 1.5 mL. Normal morphology
without mass. Few nonspecific calcifications present.

Left ovary

Measurements: 2.4 x 1.4 x 1.1 cm = volume: 1.8 mL. Normal morphology
without mass

Other findings

No free pelvic fluid or adnexal masses.
IMPRESSION: Normal appearing endometrial complex and ovaries.

Small uterine leiomyomata, larger 2.1 cm diameter extends
transmural.

## 2020-10-14 ENCOUNTER — Encounter: Payer: Self-pay | Admitting: Gynecologic Oncology

## 2020-10-14 ENCOUNTER — Inpatient Hospital Stay: Payer: 59 | Attending: Gynecologic Oncology | Admitting: Gynecologic Oncology

## 2020-10-14 ENCOUNTER — Other Ambulatory Visit: Payer: Self-pay | Admitting: Gynecologic Oncology

## 2020-10-14 ENCOUNTER — Telehealth: Payer: Self-pay | Admitting: *Deleted

## 2020-10-14 DIAGNOSIS — Z1501 Genetic susceptibility to malignant neoplasm of breast: Secondary | ICD-10-CM | POA: Diagnosis not present

## 2020-10-14 DIAGNOSIS — Z170 Estrogen receptor positive status [ER+]: Secondary | ICD-10-CM

## 2020-10-14 DIAGNOSIS — C50412 Malignant neoplasm of upper-outer quadrant of left female breast: Secondary | ICD-10-CM | POA: Diagnosis not present

## 2020-10-14 DIAGNOSIS — Z1589 Genetic susceptibility to other disease: Secondary | ICD-10-CM

## 2020-10-14 NOTE — Telephone Encounter (Signed)
Spoke with the patient and scheduled a pre op appt, patient requested Tuesday morning appts

## 2020-10-14 NOTE — Progress Notes (Signed)
Gynecologic Oncology Telehealth Consult Note: Gyn-Onc  I connected with Beverly Mcclain on 10/14/20 at  3:30 PM EDT by telephone and verified that I am speaking with the correct person using two identifiers.  I discussed the limitations, risks, security and privacy concerns of performing an evaluation and management service by telemedicine and the availability of in-person appointments. I also discussed with the patient that there may be a patient responsible charge related to this service. The patient expressed understanding and agreed to proceed.  Other persons participating in the visit and their role in the encounter: none.  Patient's location: home Provider's location: [facility]  Chief Complaint: Discuss surgery plans  Treatment History: Oncology History  Malignant neoplasm of upper-outer quadrant of left breast in female, estrogen receptor positive ([place])  12/19/2019 Initial Diagnosis   Screening mammogram showed a possible mass and 2 asymmetries in the left breast. Diagnostic mammogram showed two masses 1.4cm apart in the left breast at the 2:30 position, 0.7cm and 1.1cm, and two likely cysts, a 0.5cm mass a the 3:00 position, and a 0.9cm mass at the 11:00 position. Biopsy showed IDC in both masses at the 2:30 position, grade 3, HER-2 equivocal by IHC (2+), negative by FISH, ER+ 80%, PR- 0%, KI67 90%.   12/19/2019 Oncotype testing   Oncotype DX score 43: Distant recurrence at 9 years 31%   12/24/2019 Cancer Staging   Staging form: Breast, AJCC 8th Edition - Clinical stage from 12/24/2019: Stage IB (cT1c, cN0, cM0, G3, ER+, PR-, HER2-) - Signed by Nicholas Lose, MD on 12/24/2019   04/22/2020 Surgery   Left lumpectomy: Multifocal IDC 1.5 cm, grade 3, margins negative, 0/1 lymph node negative, ER 80% weak, PR 0%, HER-2 negative, Ki-67 90%   04/22/2020 Cancer Staging   Staging form: Breast, AJCC 8th Edition - Pathologic stage from 04/22/2020: Stage IA (pT1c, pN0, cM0, G3, ER+, PR-, HER2-) -  Signed by Gardenia Phlegm, NP on 07/28/2020 Stage prefix: Initial diagnosis Histologic grading system: 3 grade system   04/26/2020 -  Anti-estrogen oral therapy   Anastrozole daily   05/20/2020 Genetic Testing   Positive genetic testing:  A single, 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 Testing was completed through the CancerNext-Expanded + RNAinsight offered by [facility] laboratories. The report date is 05/20/2020.  The CancerNext-Expanded + RNAinsight gene panel offered by [employer] and includes sequencing and rearrangement analysis for the following 77 genes: AIP, ALK, APC, ATM, AXIN2, BAP1, BARD1, BLM, BMPR1A, BRCA1, BRCA2, BRIP1, CDC73, CDH1, CDK4, CDKN1B, CDKN2A, CHEK2, CTNNA1, DICER1, FANCC, FH, FLCN, GALNT12, KIF1B, LZTR1, MAX, MEN1, MET, MLH1, MSH2, MSH3, MSH6, MUTYH, NBN, NF1, NF2, NTHL1, PALB2, PHOX2B, PMS2, POT1, PRKAR1A, PTCH1, PTEN, RAD51C, RAD51D, RB1, RECQL, RET, SDHA, SDHAF2, SDHB, SDHC, SDHD, SMAD4, SMARCA4, SMARCB1, SMARCE1, STK11, SUFU, TMEM127, TP53, TSC1, TSC2, VHL and XRCC2 (sequencing and deletion/duplication); EGFR, EGLN1, HOXB13, KIT, MITF, PDGFRA, POLD1 and POLE (sequencing only); EPCAM and GREM1 (deletion/duplication only). RNA data is routinely analyzed for use in variant interpretation for all genes.      Interval History: Beverly Mcclain reports doing well since her visit with me. She expresses concern about payment of hospital bills (despite good coverage by her insurance company) given her low income. She is still wanting to move forward with surgery over the summer, thinking July would be best from a work and vacation standpoint. Denies any new symptoms including pelvic pain, bloating, nausea or emesis.  Past Medical/Surgical History: Past Medical History:  Diagnosis Date  . Anxiety   .  Arthritis    neck and right hip  . Cancer ([place])    breast  . Carpal tunnel syndrome    right arm  . Constipation   . Family  history of colon cancer   . Family history of leukemia   . History of IBS   . History of kidney stones   . Hx of degenerative disc disease    Stage 2  . Mutation in BRIP1 gene     Past Surgical History:  Procedure Laterality Date  . BREAST LUMPECTOMY WITH RADIOACTIVE SEED LOCALIZATION Left 04/22/2020   Procedure: LEFT BREAST LUMPECTOMY WITH RADIOACTIVE SEED LOCALIZATION TIMES TWO;  Surgeon: Alphonsa Overall, MD;  Location: [facility];  Service: General;  Laterality: Left;  LEFT PEC BLOCK  . CESAREAN SECTION    . COLONOSCOPY    . FRACTURE SURGERY     left hand 5th finger  . LITHOTRIPSY  2000  . polyp removal    . TUBAL LIGATION      Family History  Problem Relation Age of Onset  . Heart attack Paternal Grandmother   . Colon cancer Paternal Grandfather        dx unknown age  . Leukemia Paternal Aunt 102  . Cancer Paternal Uncle 61       unknown cancer type    Social History   Socioeconomic History  . Marital status: Married    Spouse name: Not on file  . Number of children: Not on file  . Years of education: Not on file  . Highest education level: Not on file  Occupational History  . Occupation: cleaner  Tobacco Use  . Smoking status: Former Smoker    Types: Cigarettes    Quit date: 12/23/1988    Years since quitting: 31.8  . Smokeless tobacco: Never Used  Vaping Use  . Vaping Use: Never used  Substance and Sexual Activity  . Alcohol use: Yes    Comment: one a day  . Drug use: Yes    Types: Marijuana    Comment: couple times a week  . Sexual activity: Yes    Birth control/protection: Post-menopausal  Other Topics Concern  . Not on file  Social History Narrative  . Not on file   Social Determinants of Health   Financial Resource Strain: Medium Risk  . Difficulty of Paying Living Expenses: Somewhat hard  Food Insecurity: No Food Insecurity  . Worried About Charity fundraiser in the Last Year: Never true  . Ran Out of Food in the Last Year: Never true   Transportation Needs: No Transportation Needs  . Lack of Transportation (Medical): No  . Lack of Transportation (Non-Medical): No  Physical Activity: Not on file  Stress: Not on file  Social Connections: Not on file    Current Medications:  Current Outpatient Medications:  .  acetaminophen (TYLENOL) 500 MG tablet, Take 1,000 mg by mouth every 6 (six) hours as needed for moderate pain. , Disp: , Rfl:  .  ALPRAZolam (XANAX) 0.5 MG tablet, Take 0.25 mg by mouth daily as needed for anxiety., Disp: , Rfl:  .  amphetamine-dextroamphetamine (ADDERALL) 30 MG tablet, TAKE 1/2 TABLET BY MOUTH EVERY DAY AT 7 AM, 12 NOON, AND 4 PM 30 DAYS, Disp: , Rfl:  .  APPLE CIDER VINEGAR PO, Take 400 mg by mouth daily., Disp: , Rfl:  .  ascorbic acid (VITAMIN C) 1000 MG tablet, Take 1,000 mg by mouth daily. , Disp: , Rfl:  .  b complex  vitamins capsule, Take 1 capsule by mouth daily., Disp: , Rfl:  .  cholecalciferol (VITAMIN D3) 25 MCG (1000 UNIT) tablet, Take 1,000 Units by mouth daily., Disp: , Rfl:  .  diphenhydrAMINE (BENADRYL) 25 MG tablet, Take 25 mg by mouth daily as needed for allergies., Disp: , Rfl:  .  HYDROcodone-acetaminophen (NORCO/VICODIN) 5-325 MG tablet, Take 0.5 tablets by mouth 2 (two) times daily as needed for moderate pain., Disp: , Rfl:  .  meloxicam (MOBIC) 15 MG tablet, Take 15 mg by mouth daily as needed for pain. , Disp: , Rfl:  .  sertraline (ZOLOFT) 100 MG tablet, Take 50 mg by mouth daily. , Disp: , Rfl:  .  TURMERIC PO, Take 1 capsule by mouth daily., Disp: , Rfl:  .  Zinc 50 MG CAPS, Take 50 mg by mouth daily., Disp: , Rfl:   Review of Symptoms: Pertinent positive as per HPI.  Physical Exam: There were no vitals taken for this visit. Not performed given limitations of this type of visit.  Laboratory & Radiologic Studies: None new  Assessment & Plan: Beverly Mcclain is a 60 y.o. woman with ER+ breast cancer and BRIP1 mutation.  The patient continues to want to move  forward with plan for surgery over the summer. Given significant financial concerns, I will ask my office to contact financial counseling so that they may reach out to the patient and offer any additional support the hospital/Cancer center may be able to provide.   The patient would like to tentatively plan for risk-reducing surgery in mid July. We picked 7/21 as a date and will schedule a pre-operative phone visit with Joylene John, NP for early July.  I discussed the assessment and treatment plan with the patient. The patient was provided with an opportunity to ask questions and all were answered. The patient agreed with the plan and demonstrated an understanding of the instructions.   The patient was advised to call back or see an in-person evaluation if the symptoms worsen or if the condition fails to improve as anticipated.   I provided 16 minutes of non face-to-face telephone visit time during this encounter, and > 50% was spent counseling as documented under my assessment & plan.   Jeral Pinch, MD  Division of Gynecologic Oncology  Department of Obstetrics and Gynecology  [facility]

## 2020-11-03 ENCOUNTER — Other Ambulatory Visit: Payer: Self-pay

## 2020-11-03 ENCOUNTER — Ambulatory Visit
Admission: RE | Admit: 2020-11-03 | Discharge: 2020-11-03 | Disposition: A | Discharge status: Home / Self Care | Payer: 59 | Source: Ambulatory Visit | Attending: Adult Health | Admitting: Adult Health

## 2020-11-03 DIAGNOSIS — E2839 Other primary ovarian failure: Secondary | ICD-10-CM

## 2020-11-03 DIAGNOSIS — Z170 Estrogen receptor positive status [ER+]: Secondary | ICD-10-CM

## 2020-11-04 ENCOUNTER — Telehealth: Payer: Self-pay

## 2020-11-04 NOTE — Telephone Encounter (Signed)
-----   Message from Gardenia Phlegm, NP sent at 11/03/2020  2:34 PM EDT ----- Beverly Mcclain call patient, she has osteopenia. I recommend calcium, vitamin d, weight bearing exercises.  Repeat testing in 2 years, and f/u with Dr. Lindi Adie in 01/2021 for additional recs. ----- Message ----- From: Interface, Rad Results In Sent: 11/03/2020   2:01 PM EDT To: Gardenia Phlegm, NP

## 2020-11-04 NOTE — Telephone Encounter (Signed)
Pt given message per Wilber Bihari. Pt states she will begin calcium supplement with vitamin D and start walking daily. All questions answered. Pt verbalized thanks and understanding.

## 2020-11-11 ENCOUNTER — Telehealth: Payer: Self-pay | Admitting: Genetic Counselor

## 2020-11-11 NOTE — Telephone Encounter (Signed)
Returned Beverly Mcclain's call regarding her genetic test results and setting up a genetic counseling appointment. Requested that she call back.

## 2020-11-18 ENCOUNTER — Telehealth: Payer: Self-pay

## 2020-11-18 NOTE — Telephone Encounter (Signed)
Received phone call from [facility] regarding her upcoming surgery. She has just scheduled hip replacement surgery on 01/07/21. She is considering cancelling her surgery with Dr. Berline Lopes on 12/09/20.   Told patient I would speak with Dr. Berline Lopes and will call her back.

## 2020-11-18 NOTE — Telephone Encounter (Signed)
Spoke with Beverly Mcclain, offered earlier surgery date. Or surgery with Dr. Berline Lopes after her hip surgery (we will need clearance from her ortho surgeon). Patient states at this time she would like to cancel her surgery. She is going to focus on her hip replacement. Should she decide to proceed with surgery with Dr. Berline Lopes she will call us back.

## 2020-11-30 ENCOUNTER — Encounter: Payer: 59 | Admitting: Gynecologic Oncology

## 2020-12-03 ENCOUNTER — Other Ambulatory Visit (HOSPITAL_COMMUNITY): Payer: 59

## 2020-12-08 ENCOUNTER — Ambulatory Visit (INDEPENDENT_AMBULATORY_CARE_PROVIDER_SITE_OTHER): Payer: 59 | Admitting: Dermatology

## 2020-12-08 ENCOUNTER — Other Ambulatory Visit: Payer: Self-pay

## 2020-12-08 ENCOUNTER — Encounter: Payer: Self-pay | Admitting: Dermatology

## 2020-12-08 DIAGNOSIS — D1801 Hemangioma of skin and subcutaneous tissue: Secondary | ICD-10-CM

## 2020-12-08 DIAGNOSIS — Z1283 Encounter for screening for malignant neoplasm of skin: Secondary | ICD-10-CM | POA: Diagnosis not present

## 2020-12-08 DIAGNOSIS — L815 Leukoderma, not elsewhere classified: Secondary | ICD-10-CM | POA: Diagnosis not present

## 2020-12-09 ENCOUNTER — Ambulatory Visit: Admit: 2020-12-09 | Payer: 59 | Admitting: Gynecologic Oncology

## 2020-12-09 DIAGNOSIS — Z1589 Genetic susceptibility to other disease: Secondary | ICD-10-CM

## 2020-12-09 SURGERY — SALPINGO-OOPHORECTOMY, BILATERAL, LAPAROSCOPIC
Anesthesia: General | Laterality: Bilateral

## 2020-12-14 ENCOUNTER — Other Ambulatory Visit: Payer: Self-pay

## 2020-12-14 ENCOUNTER — Ambulatory Visit
Admission: RE | Admit: 2020-12-14 | Discharge: 2020-12-14 | Disposition: A | Discharge status: Home / Self Care | Payer: 59 | Source: Ambulatory Visit | Attending: Adult Health | Admitting: Adult Health

## 2020-12-14 DIAGNOSIS — Z170 Estrogen receptor positive status [ER+]: Secondary | ICD-10-CM

## 2020-12-14 DIAGNOSIS — C50412 Malignant neoplasm of upper-outer quadrant of left female breast: Secondary | ICD-10-CM

## 2020-12-14 IMAGING — MG DIGITAL DIAGNOSTIC BILAT W/ TOMO W/ CAD
6 of 9 series · 6 of 25 positions shown · non-contrast
Comparison: Previous exam(s).

CLINICAL DATA: 59-year-old with a personal history of malignant
lumpectomy of the UPPER OUTER QUADRANT of the LEFT breast and
excisional biopsy of a papilloma in the outer LEFT breast in
[DATE]. No adjuvant therapy. The patient had multiple
BILATERAL breast core needle biopsies prior to her lumpectomy. This
is the patient's initial post lumpectomy annual evaluation.
TECHNIQUE: Bilateral digital diagnostic mammography and breast tomosynthesis
was performed. The images were evaluated with computer-aided
detection.

[L MLO]
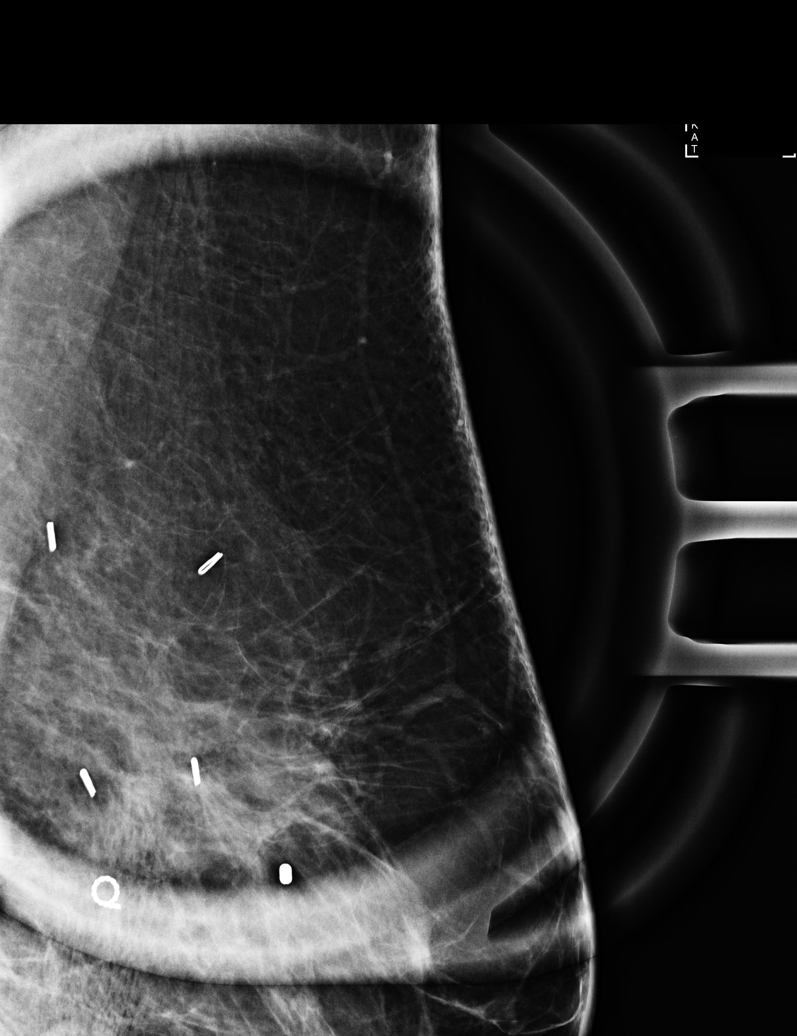

[R MLO synth-2D]
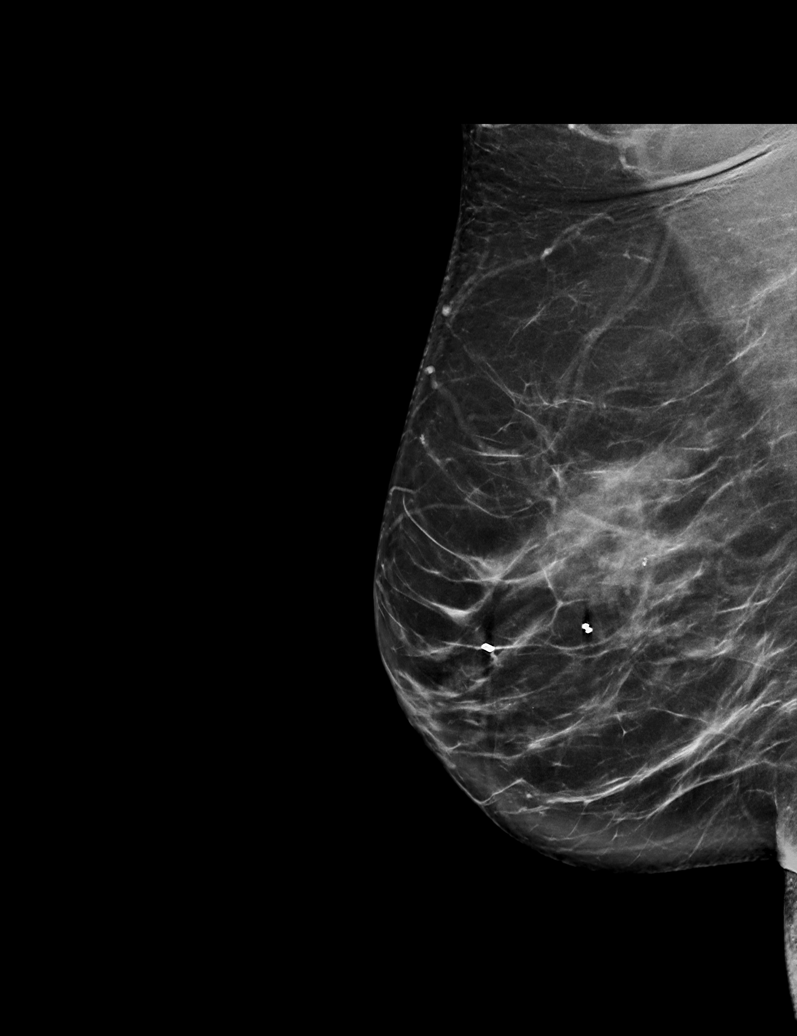

[L MLO synth-2D]
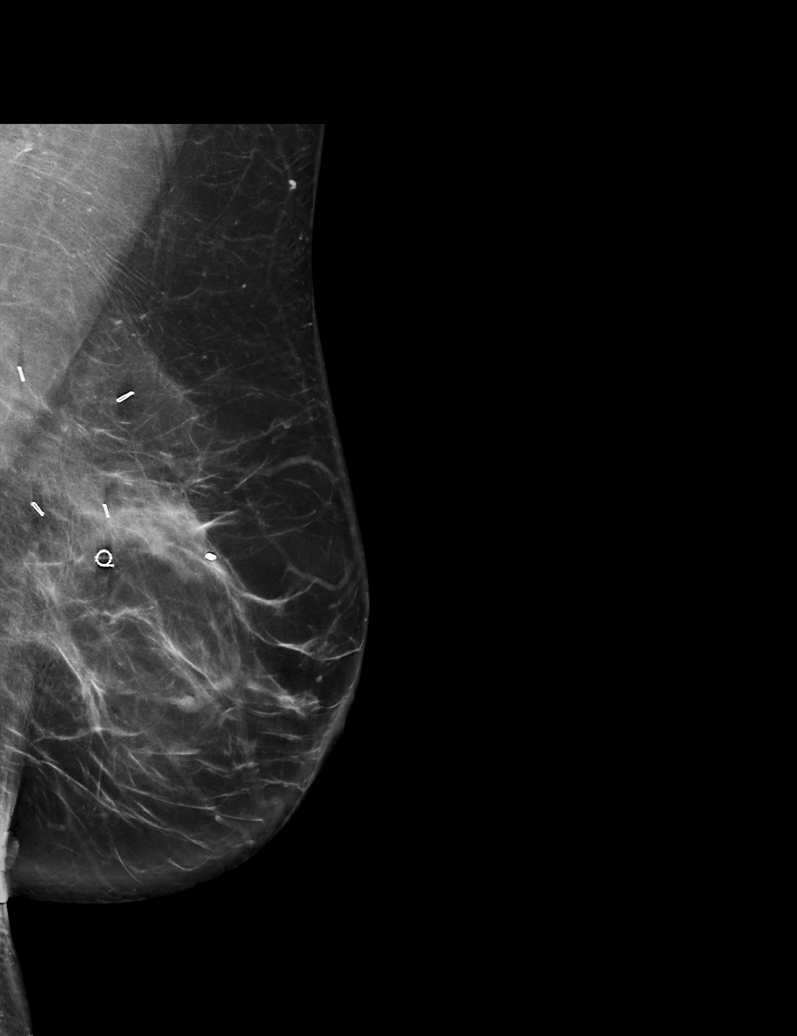

[R CC synth-2D]
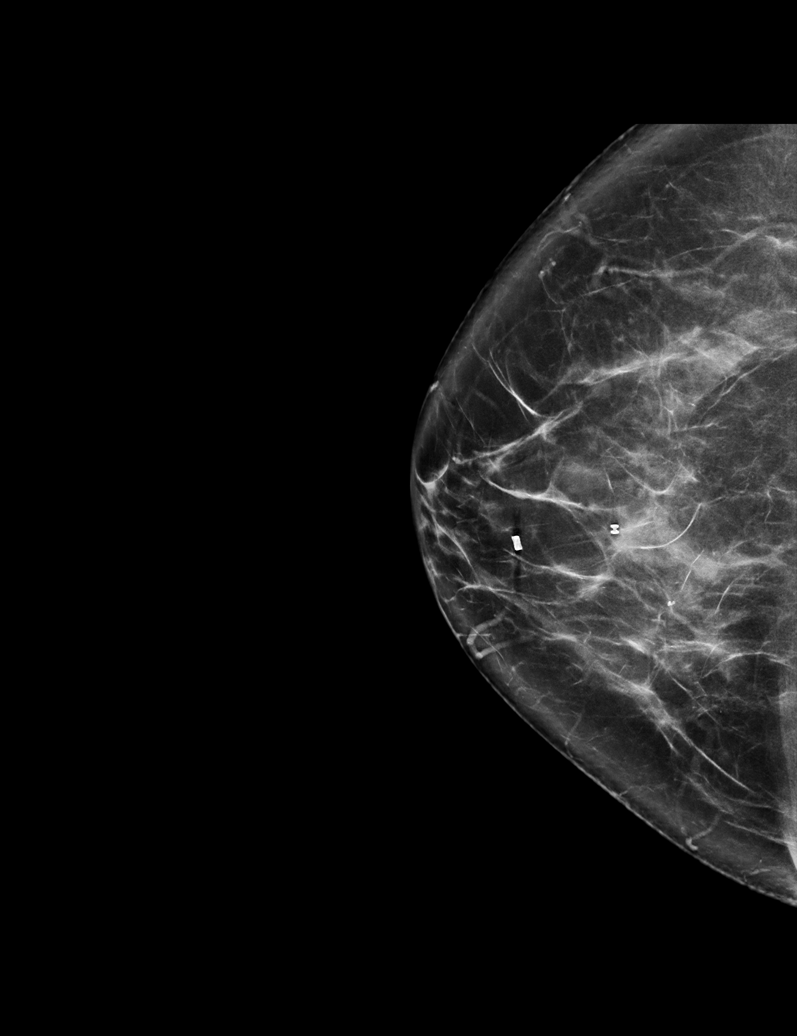

[L CC synth-2D]
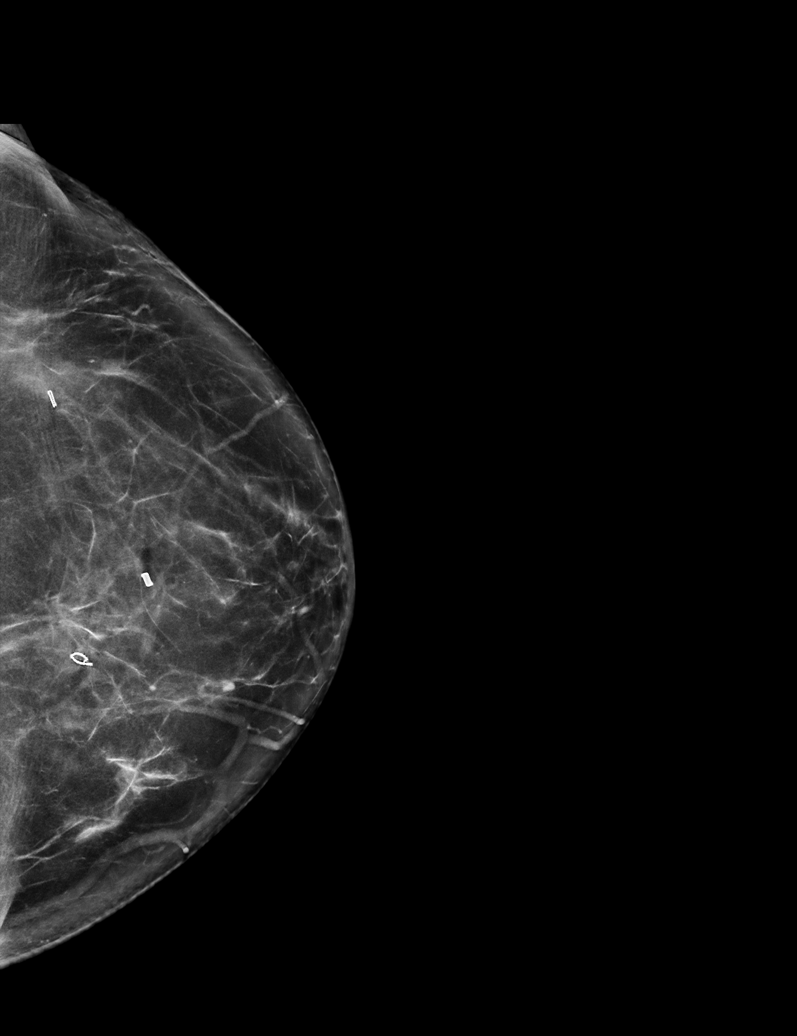

[L MLO tomo · tomo slice 41/80.0]
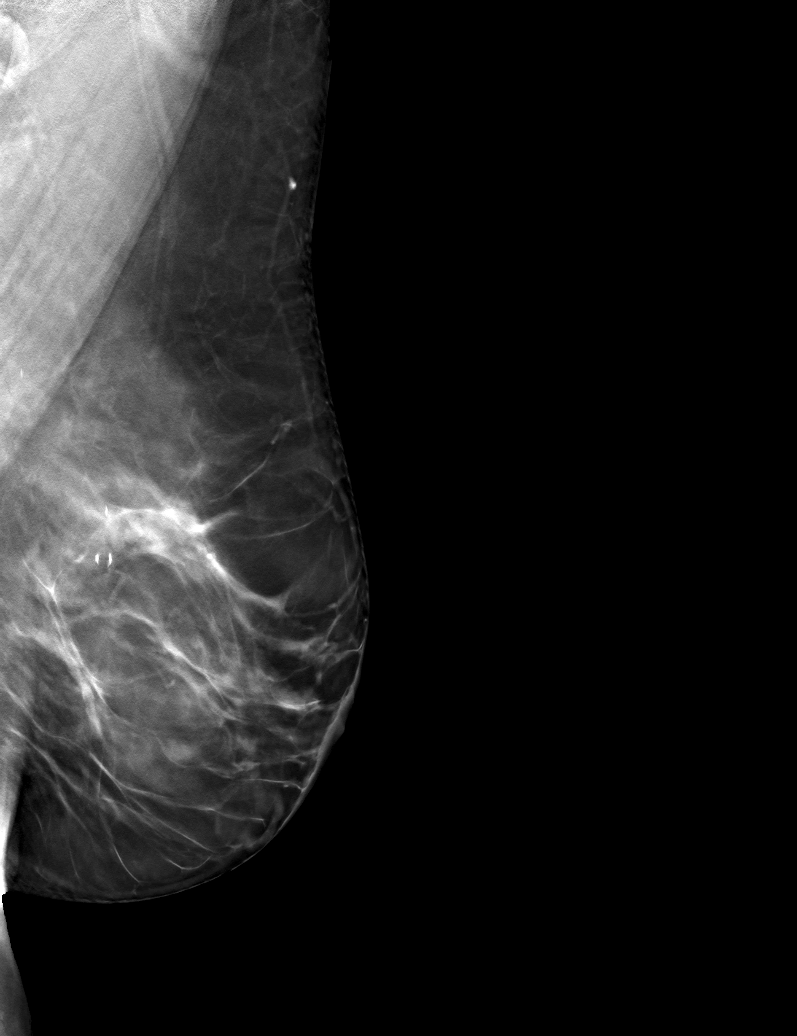

[6 of 25 positions shown; findings below may reference images not displayed]

Pathology related to the prior biopsies is:

-RIGHT breast, slight UPPER INNER QUADRANT at middle depth, barbell
clip: Papillary apically metaplasia, fibrocystic changes.

-RIGHT breast, retroareolar at anterior depth, cylinder clip:
Intraductal papilloma.

-LEFT breast, upper breast near 12 o'clock location at middle depth,
cylinder clip: Usual ductal hyperplasia, fibrocystic changes,
apocrine metaplasia, PASH.

-LEFT breast, UPPER INNER QUADRANT at posterior depth, Q clip:
Fibrocystic changes.

EXAM:
DIGITAL DIAGNOSTIC BILATERAL MAMMOGRAM WITH TOMOSYNTHESIS AND CAD
ACR Breast Density Category c: The breast tissue is heterogeneously
dense, which may obscure small masses.
FINDINGS: Full field CC and MLO views of both breasts and a spot magnification
MLO view of the lumpectomy site in the LEFT breast were obtained.

RIGHT: No findings suspicious for malignancy.

LEFT: No findings suspicious for malignancy. Post lumpectomy
scar/distortion in the UPPER OUTER QUADRANT at posterior depth.
IMPRESSION: 1. No mammographic evidence of malignancy involving either breast.
2. Expected post lumpectomy changes involving the LEFT breast.

RECOMMENDATION:
BILATERAL diagnostic mammography in 1 year.

I have discussed the findings and recommendations with the patient.
If applicable, a reminder letter will be sent to the patient
regarding the next appointment.

BI-RADS CATEGORY  2: Benign.

## 2020-12-17 ENCOUNTER — Encounter: Payer: Self-pay | Admitting: Dermatology

## 2020-12-17 NOTE — Progress Notes (Signed)
   New Patient   Subjective  Beverly Mcclain is a 60 y.o. female who presents for the following: New Patient (Initial Visit) (Patient here today for skin check. Per patient her Oncologist wanted her to come be seen she was diagnosed with breast cancer last year. Per patient no personal history or family history of atypical moles, melanoma or non mole skin cancer. ).  General skin examination Location:  Duration:  Quality:  Associated Signs/Symptoms: Modifying Factors:  Severity:  Timing: Context:    The following portions of the chart were reviewed this encounter and updated as appropriate:  Tobacco  Allergies  Meds  Problems  Med Hx  Surg Hx  Fam Hx      Objective  Well appearing patient in no apparent distress; mood and affect are within normal limits. Left Upper Arm - Anterior, Right Upper Arm - Anterior Hypopigmented for millimeter multiple macules.  Abdomen (Lower Torso, Anterior) Multiple 2 mm smooth red dermal papules  Torso - Posterior (Back) Head to toe exam today no signs of atypical moles, melanoma or non mole skin cancer.  A brief literature search did not show her gene mutation to be a significant risk factor for skin cancer.    A full examination was performed including scalp, head, eyes, ears, nose, lips, neck, chest, axillae, abdomen, back, buttocks, bilateral upper extremities, bilateral lower extremities, hands, feet, fingers, toes, fingernails, and toenails. All findings within normal limits unless otherwise noted below.  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  No intervention initiated.  Hemangioma of skin Abdomen (Lower Torso, Anterior)  No intervention indicated  Skin exam for malignant neoplasm Torso - Posterior (Back)  Yearly skin check.  Encouraged to self  examine twice annually.  Continued ultraviolet protection.

## 2020-12-20 HISTORY — PX: TOTAL HIP ARTHROPLASTY: SHX124

## 2020-12-30 ENCOUNTER — Telehealth: Payer: Self-pay | Admitting: Genetic Counselor

## 2020-12-30 NOTE — Telephone Encounter (Signed)
Beverly Mcclain called back to schedule a genetic counseling appointment to review her genetic test results. Scheduled a virtual mychart appointment for Monday 01/17/21 at 10:00 am.

## 2021-01-17 ENCOUNTER — Ambulatory Visit (HOSPITAL_BASED_OUTPATIENT_CLINIC_OR_DEPARTMENT_OTHER): Payer: 59 | Admitting: Genetic Counselor

## 2021-01-17 DIAGNOSIS — Z1501 Genetic susceptibility to malignant neoplasm of breast: Secondary | ICD-10-CM

## 2021-01-17 DIAGNOSIS — Z806 Family history of leukemia: Secondary | ICD-10-CM

## 2021-01-17 DIAGNOSIS — Z809 Family history of malignant neoplasm, unspecified: Secondary | ICD-10-CM | POA: Diagnosis not present

## 2021-01-17 DIAGNOSIS — Z1379 Encounter for other screening for genetic and chromosomal anomalies: Secondary | ICD-10-CM | POA: Diagnosis not present

## 2021-01-17 DIAGNOSIS — Z1589 Genetic susceptibility to other disease: Secondary | ICD-10-CM

## 2021-01-18 NOTE — Progress Notes (Signed)
GENETIC TEST RESULTS   Patient Name: Beverly Mcclain Patient Age: 60 y.o. Encounter Date: 01/17/2021  Referring Provider: Nicholas Lose, MD [place],  [place] [id number]   I connected with Ms. Beverly Mcclain on 01/17/2021 at 10:00 am EDT by video conference and verified that I am speaking with the correct person using two identifiers.   Patient location: Home Provider location: [facility]    HPI:  Ms. Beverly Mcclain was previously seen in the [facility] due to a personal and family history of cancer and concern regarding a hereditary predisposition to cancer in the family. Please refer to the prior Genetics clinic note for more information regarding Ms. Beverly Mcclain's medical and family histories and our assessment at the time. Ms. Beverly Mcclain requested this appointment to review her age-specific risks for BRIP1-related cancer as she weighs her option of bilateral salpingo-oophorectomy.  FAMILY HISTORY:  We obtained a detailed, 4-generation family history.  Significant diagnoses are listed below: Family History  Problem Relation Age of Onset   Heart attack Paternal Grandmother    Colon cancer Paternal Grandfather        dx unknown age   Leukemia Paternal Aunt 40   Cancer Paternal Uncle 95       unknown cancer type   (Information from family history taken on 05/04/2020):  Ms. Pytel has one daughter (age 17) and one son (age 32). She had one full-brother who died at age 25, two maternal half-sisters (ages 2 and 84), two paternal half-sisters (ages 41 and 44), and one paternal half-brother (age 54). None of these relatives have had cancer.    Ms. Briguglio mother is 53 and has not had cancer. Ms. Filyaw had one maternal aunt who died at age 33 due to suicide. Her maternal grandmother died at age 51, and she does not have any information about her maternal grandfather.    Ms. Contino father died at age 60 and did not have cancer. She had  four paternal uncles and two paternal aunts. One uncle died at age 74 from cancer (type unknown to patient). One aunt died at age 32 from leukemia. The other aunt died younger than 74 from suicide. Her paternal grandmother died at age 67 from a massive heart attack, and her paternal grandfather died older than 32 from colon cancer.   Ms. Cranmore is unaware of previous family history of genetic testing for hereditary cancer risks. Patient's ancestors are of unkonwn descent. There is no reported Ashkenazi Jewish ancestry. There is no known consanguinity.   GENETIC TESTING: Genetic testing previously reported on 05/20/2020 through the CancerNext-Expanded+RNAinsight panel offered by [facility]. 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  The CancerNext-Expanded + RNAinsight gene panel offered by [employer] and includes sequencing and rearrangement analysis for the following 77 genes: AIP, ALK, APC, ATM, AXIN2, BAP1, BARD1, BLM, BMPR1A, BRCA1, BRCA2, BRIP1, CDC73, CDH1, CDK4, CDKN1B, CDKN2A, CHEK2, CTNNA1, DICER1, FANCC, FH, FLCN, GALNT12, KIF1B, LZTR1, MAX, MEN1, MET, MLH1, MSH2, MSH3, MSH6, MUTYH, NBN, NF1, NF2, NTHL1, PALB2, PHOX2B, PMS2, POT1, PRKAR1A, PTCH1, PTEN, RAD51C, RAD51D, RB1, RECQL, RET, SDHA, SDHAF2, SDHB, SDHC, SDHD, SMAD4, SMARCA4, SMARCB1, SMARCE1, STK11, SUFU, TMEM127, TP53, TSC1, TSC2, VHL and XRCC2 (sequencing and deletion/duplication); EGFR, EGLN1, HOXB13, KIT, MITF, PDGFRA, POLD1 and POLE (sequencing only); EPCAM and GREM1 (deletion/duplication only). RNA data is routinely analyzed for use in variant interpretation for all genes. The test report will be scanned into EPIC and located under the  Molecular Pathology section of the Results Review tab.  A portion of the result report is included below for reference.      BRIP1 CANCER RISKS & RECOMMENDATIONS:  We discussed that women who are carriers of a single pathogenic variant  in the BRIP1 gene have an increased risk of ovarian cancer, and possibly breast cancer. The risk for ovarian cancer in these women is estimated to be up to 9.1% (compared to the general population ovarian cancer risk of approximately 1.3%). There is preliminary evidence supporting a correlation with BRIP1 and predisposition to breast cancer; however, the available evidence is insufficient to make a determination regarding this relationship. An individual with a BRIP1 pathogenic variant will not necessarily develop cancer in their lifetime, but the risk for cancer is increased over the general population risk.   We reviewed available data on age-related risk for BRIP1-related ovarian cancer. One study showed that the median age of diagnosis for ovarian cancer among BRIP1 carriers was 58 years old, and that 90% of ovarian cancer patients with a BRIP1 mutation were diagnosed after age 2 (PMID [id number]). We recommend that Ms. Beverly Mcclain consider the risk-reduction recommendations for individuals with a BRIP1 mutation, especially given that, at age 20, she has not outlived the 72 of her ovarian cancer risk.  Based on current understanding, men don't appear to have increased cancer risks due to BRIP1 mutations. However, men can carry the mutation and pass it on to their children. Additionally, information regarding female cancer risk could change as more research is done to understand BRIP1 mutations.   Ovarian Cancer Management:  The National Comprehensive Cancer Network (NCCN) recommends consideration of prophylactic salpingo-oophorectomy (surgical removal of the ovaries and fallopian tubes) for women with a pathogenic variant in BRIP1 after childbearing is complete. The current evidence is insufficient to make a firm recommendation as to the optimal age for this procedure. However, based on the current, limited evidence base, a discussion about surgery should be held around 65-39 years of age or earlier based  on a specific family history of early-onset ovarian cancer (Artist. Genetic/Familial High-Risk Assessment: Breast, Ovarian, and Pancreatic; Version 2.2022). Women electing to defer prophylactic oophorectomy can consider screening with serum CA-125 and transvaginal ultrasound; however, data do not support such screening and it should not be a substitute for preventive surgery.    Breast Cancer Management:  The current NCCN guidelines do not recommend additional breast cancer screening for individuals with a single pathogenic BRIP1 variant beyond what is recommended for the general population. However, they caution that cancer screening should ultimately be guided by personal and family history (Artist. Genetic/Familial High-Risk Assessment: Breast, Ovarian, and Pancreatic; Version 2.2022).   An individual's cancer risk and medical management are not determined by genetic test results alone. Overall cancer risk assessment incorporates additional factors, including personal medical history, family history, and any available genetic information that may result in a personalized plan for cancer prevention and surveillance.  FAMILY MEMBERS: It is important that all of Ms. Beverly Mcclain's relatives (both men and women) know of the presence of this gene mutation. Site-specific genetic testing can sort out who in the family is at risk and who is not.   Ms. Beverly Mcclain first-degree relatives (children, full-siblings, and parents) each have a 50% (1 in 2) chance to have inherited this mutation. We recommend they have genetic testing for this same mutation, as identifying the presence of this mutation would allow them to also take advantage of risk-reducing  measures.   Additionally, individuals with a pathogenic variant in BRIP1 are carriers of a rare genetic condition called Fanconi anemia. Fanconi anemia is an autosomal recessive disorder that is  characterized by bone marrow failure and variable presentation of anomalies, including short stature, abnormal skin pigmentation, abnormal thumbs, malformations of the skeletal and central nervous systems, and developmental delay. Risks for leukemia and early onset solid tumors are significantly elevated. For there to be a risk of Fanconi anemia in offspring, both parents would each have to have a single pathogenic variant in [place]; in such a case, the chance of having an affected child is 25% (1 in 4).  SUPPORT AND RESOURCES: If Ms. Beverly Mcclain is interested in BRIP1-specific information and support, there are two groups, Facing Our Risk (www.facingourrisk.org) and Bright Pink (www.brightpink.org) which some people have found useful. They provide opportunities to speak with other individuals from high-risk families. To locate genetic counselors in other cities, visit the website www.FindAGeneticCounselor.com and search for a counselor by zip code.  We encouraged Ms. Beverly Mcclain to remain in contact with [place] on an annual basis so we can update her personal and family histories, and let her know of advances in cancer genetics that may benefit the family. Our contact number was provided. Ms. Beverly Mcclain questions were answered to her satisfaction today, and she knows she is welcome to call anytime with additional questions.   Clint Guy, [place], [facility] Licensed, Certified Dispensing [email or web address] phone: [phone]  The patient was seen for a total of 35 minutes in face-to-face genetic counseling.

## 2021-02-07 ENCOUNTER — Ambulatory Visit: Payer: 59 | Admitting: Hematology and Oncology

## 2021-02-07 NOTE — Assessment & Plan Note (Deleted)
12/19/2019:Screening mammogram showed a possible mass and 2 asymmetries in the left breast. 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 T1cN 0 stage Ib clinical stage Oncotype DX: 43, risk of distant recurrence at 9 years: 31%: High risk CT CAP and bone scan: No metastatic disease Breast MRI 01/02/2020: 4.5 cm right breast mass at 12:00, non-mass enhancement 3.7 cm 12:00, 2 previously known malignancies 1.2 cm, 6 mm enhancement, 8 mmNMEleft breast (recommended biopsies)  Treatment plan: 1. 04/22/2020: Left lumpectomy: Multifocal IDC 1.5 cm, grade 3, margins negative, 0/1 lymph node negative, ER 80% weak, PR 0%, HER-2 negative, Ki-67 90% 2.adjuvant chemotherapy with Taxotere and Cytoxan every 3 weeks x4 cycles (patient refused) 3. Adjuvant radiation therapy (patient refused) 4. Adjuvant antiestrogen therapy -------------------------------------------------------------------------------------------------------------------------- Current treatment: Antiestrogen therapy with anastrozole started 04/30/2020 Anastrozole toxicities:  Breast cancer surveillance: 1.  Breast exam 02/07/2021: Benign 2. mammogram 12/14/2020: Benign breast density category C  Return to clinic in 1 year for follow-up

## 2021-04-21 ENCOUNTER — Ambulatory Visit (HOSPITAL_COMMUNITY): Payer: 59

## 2021-04-21 ENCOUNTER — Encounter (HOSPITAL_COMMUNITY): Payer: Self-pay

## 2021-04-24 ENCOUNTER — Ambulatory Visit (HOSPITAL_COMMUNITY)
Admission: RE | Admit: 2021-04-24 | Discharge: 2021-04-24 | Disposition: A | Discharge status: Home / Self Care | Payer: 59 | Source: Ambulatory Visit | Attending: Adult Health | Admitting: Adult Health

## 2021-04-24 ENCOUNTER — Ambulatory Visit (HOSPITAL_COMMUNITY): Payer: 59

## 2021-04-24 DIAGNOSIS — C50412 Malignant neoplasm of upper-outer quadrant of left female breast: Secondary | ICD-10-CM | POA: Insufficient documentation

## 2021-04-24 DIAGNOSIS — Z170 Estrogen receptor positive status [ER+]: Secondary | ICD-10-CM | POA: Insufficient documentation

## 2021-04-24 IMAGING — MR MR BREAST BILAT WO/W CM
8 of 12 series · 28 of 48 positions shown · IV contrast (7ml GADAVIST)
Comparison: Prior exams including previous breast MRIs, most recent
dated [DATE].

CLINICAL DATA: High risk screening. Personal history of left breast
malignancy.
TECHNIQUE: Multiplanar, multisequence MR images of both breasts were obtained
prior to and following the intravenous administration of 7 ml of
Gadavist

[Series 2: T2 · axial · 3.0mm · 0.91mm/px · 1 of 48 slices shown]
[im 1/48]
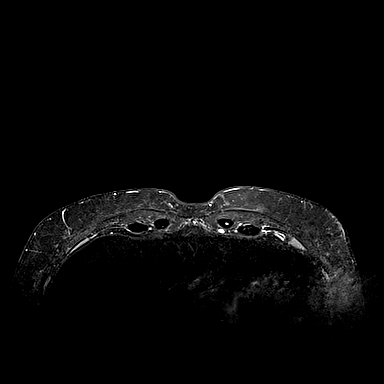

[Series 3: T1 fat-sat · axial · 1.2mm · 0.78mm/px · z∈[-62,+110]mm · 5 of 144 slices shown (1 of 4)]
[im 1/144]
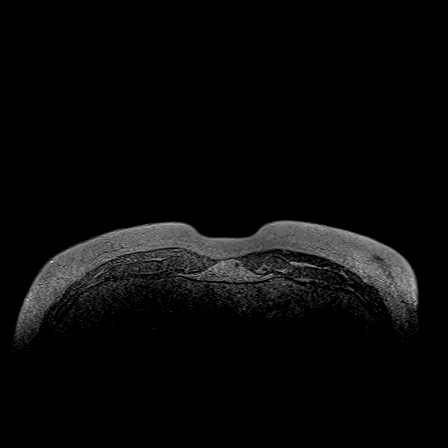
[im 36/144]
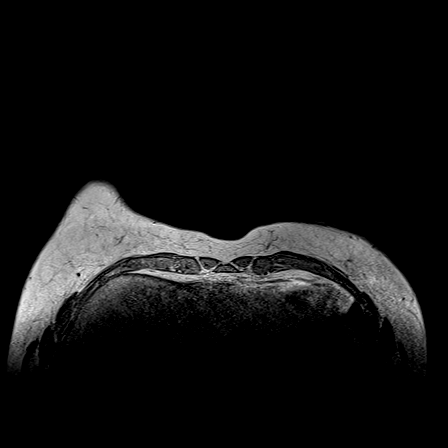
[im 72/144]
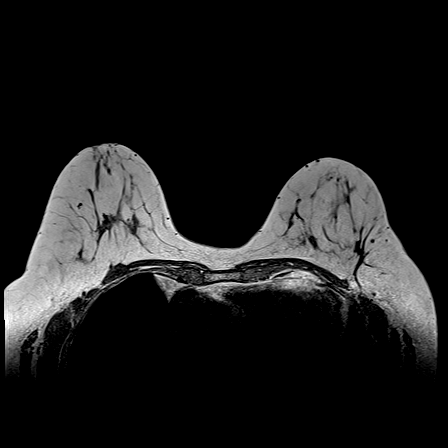
[im 108/144]
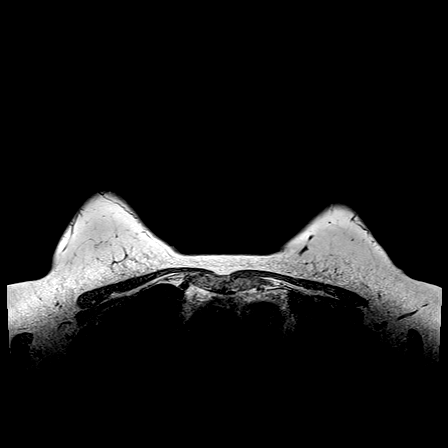
[im 144/144]
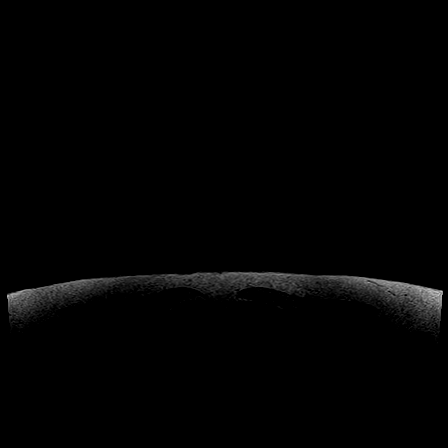

[Series 4: T1 fat-sat · axial · 1.2mm · 0.84mm/px · z∈[-67,+67]mm · 5 of 112 slices shown (2 of 4)]
[im 1/112]
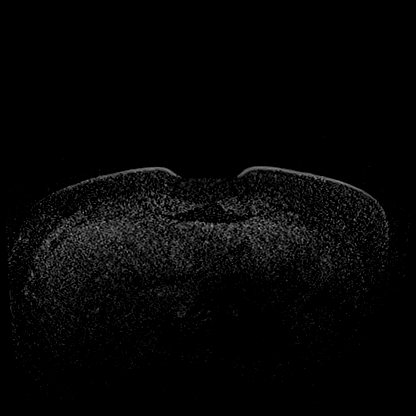
[im 28/112]
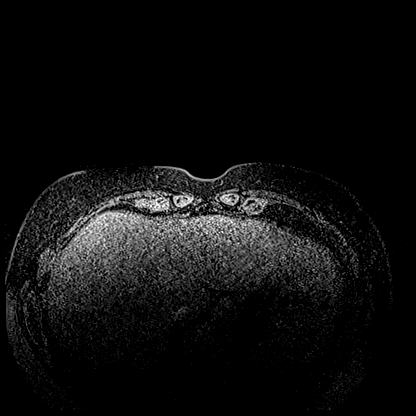
[im 56/112]
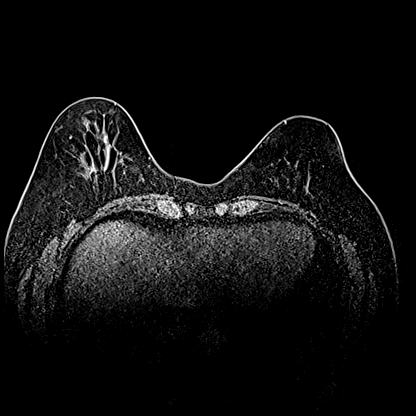
[im 84/112]
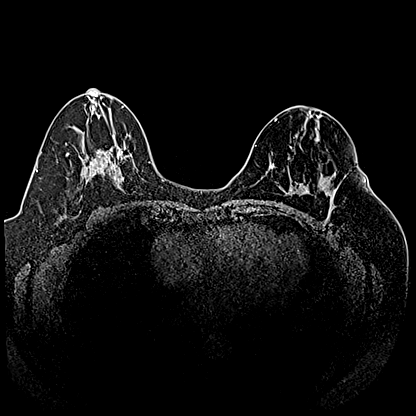
[im 112/112]
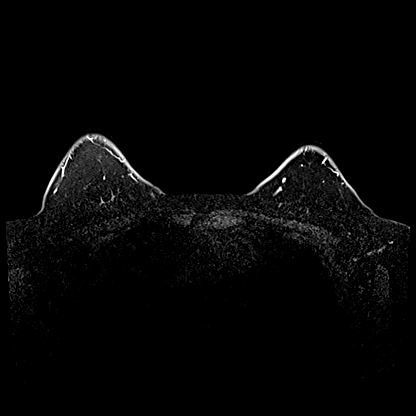

[Series 5: T1 fat-sat · axial · 1.2mm · 0.84mm/px · z∈[-67,+67]mm · 5 of 112 slices shown (3 of 4)]
[im 1/112]
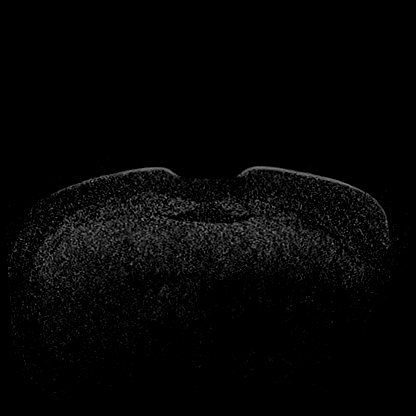
[im 28/112]
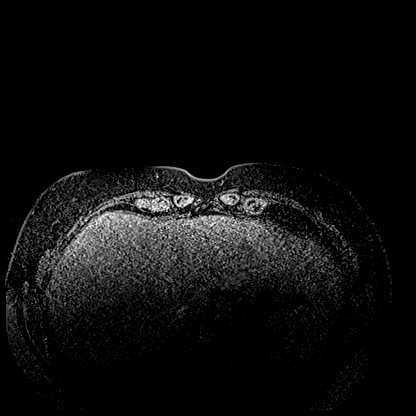
[im 56/112]
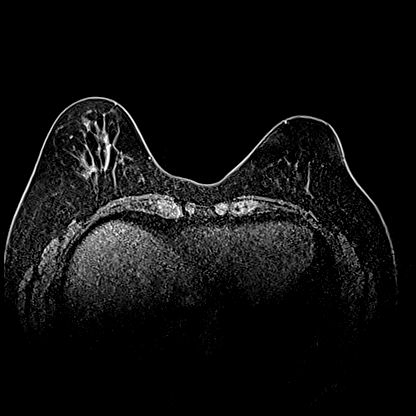
[im 84/112]
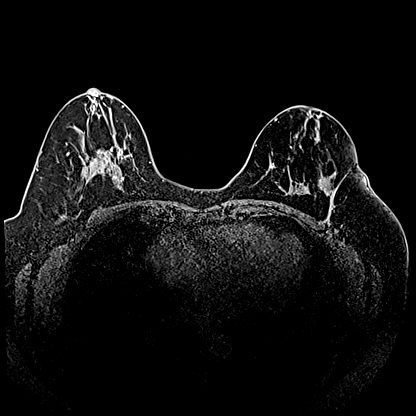
[im 112/112]
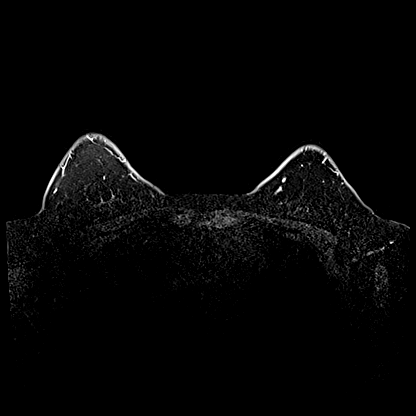

[Series 6: T1 fat-sat · axial · 1.2mm · 0.84mm/px · z∈[-67,+67]mm · 5 of 112 slices shown (4 of 4)]
[im 1/112]
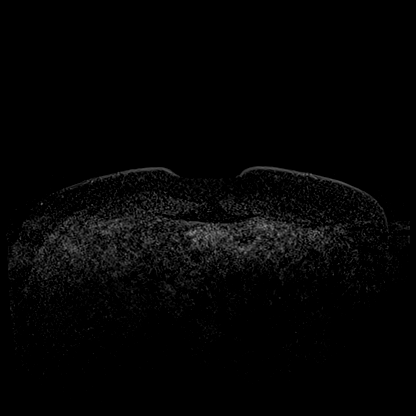
[im 28/112]
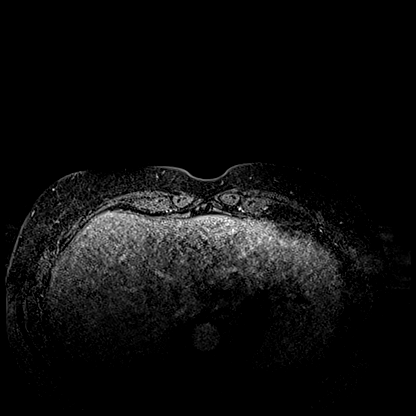
[im 56/112]
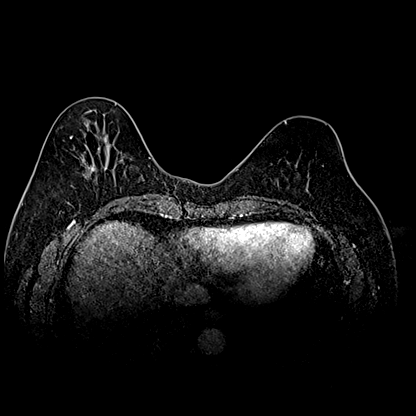
[im 84/112]
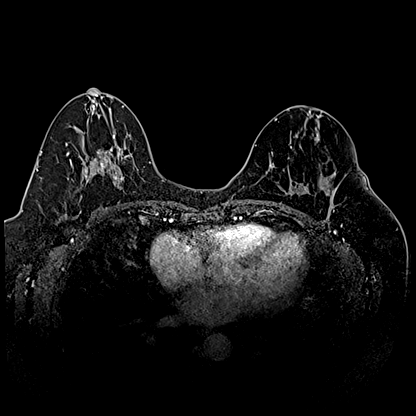
[im 112/112]
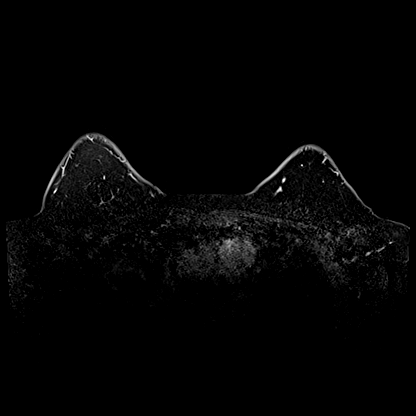

[Series 7: T1 · axial · 1.2mm · 0.84mm/px · z∈[-67,+67]mm · 5 of 112 slices shown (1 of 3)]
[im 1/112]
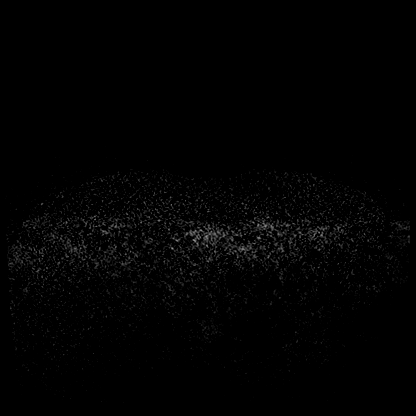
[im 28/112]
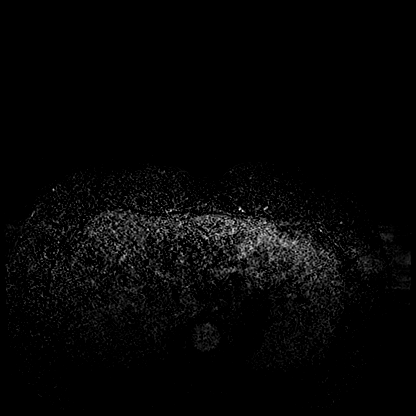
[im 56/112]
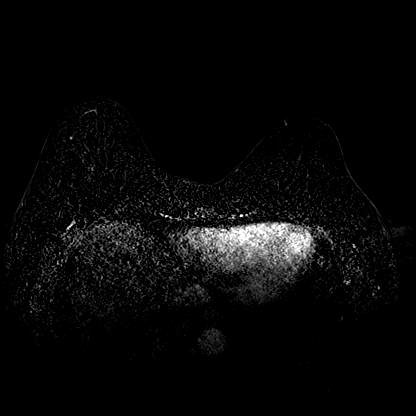
[im 84/112]
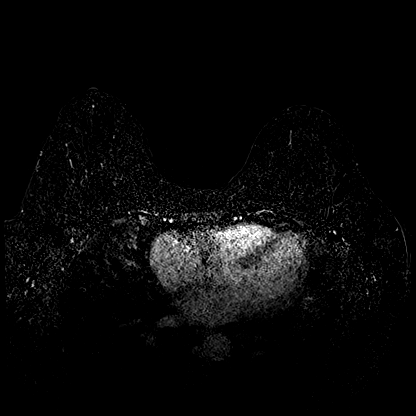
[im 112/112]
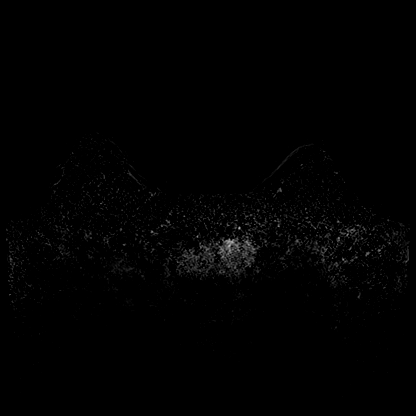

[Series 8: T1 · coronal · 350.0mm · 0.84mm/px · 1 of 3 slices shown (2 of 3)]
[im 1/3]
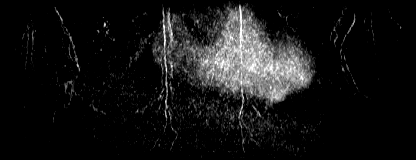

[Series 9: T1 · axial · 134.4mm · 0.84mm/px · 1 of 3 slices shown (3 of 3)]
[im 1/3]
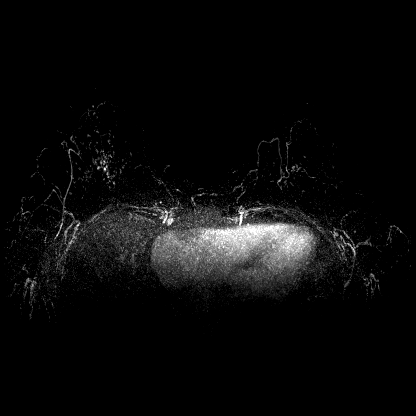

[28 of 48 positions shown; findings below may reference images not displayed]

59-year-old with a personal history of malignantlumpectomy of the
UPPER OUTER QUADRANT of the LEFT breast andexcisional biopsy of a
papilloma in the outer LEFT breast in[REDACTED], [E0]. No adjuvant
therapy. The patient had multipleBILATERAL breast core needle
biopsies prior to her lumpectomy. Thisis the patient's initial post
lumpectomy annual evaluation.Pathology related to the prior biopsies
is:-RIGHT breast, slight UPPER INNER QUADRANT at middle depth,
barbellclip: Papillary apically metaplasia, fibrocystic
changes.-RIGHT breast, retroareolar at anterior depth, cylinder
clip:Intraductal papilloma.-LEFT breast, upper breast near 12
o'clock location at middle depth,cylinder clip: Usual ductal
hyperplasia, fibrocystic changes,apocrine metaplasia, PASH.-LEFT
breast, UPPER INNER QUADRANT at posterior depth, Q clip:Fibrocystic
changes.

EXAM:
BILATERAL BREAST MRI WITH AND WITHOUT CONTRAST
Three-dimensional MR images were rendered by post-processing of the
original MR data on an independent workstation. The
three-dimensional MR images were interpreted, and findings are
reported in the following complete MRI report for this study. Three
dimensional images were evaluated at the independent interpreting
workstation using the DynaCAD thin client.
FINDINGS: Breast composition: b. Scattered fibroglandular tissue.

Background parenchymal enhancement: Moderate.

Right breast: No mass or suspicious enhancement. Susceptibility
artifact from prior post biopsy marker clips, more anterior
reflecting the previous papilloma in the more posterior and inferior
representing previous papillary apical metaplasia and fibrocystic
changes.

Left breast: No mass or suspicious enhancement. Multiple
susceptibility artifacts from post lumpectomy vascular clips and
prior biopsy marker clips, assault under clip in the central breast,
usual ductal hyperplasia, fibrocystic changes, Pash and apocrine
metaplasia and the Q shaped clip more inferiorly and posterior
medially, representing fibrocystic changes.

Lymph nodes: No abnormal appearing lymph nodes.

Ancillary findings:  None.
IMPRESSION: 1. No evidence of breast malignancy.
2. Benign changes from prior left lumpectomy.

RECOMMENDATION:
1. Diagnostic mammography in [DATE] per standard post lumpectomy
protocol.
2. Consider supplemental annual screening breast MRI given the
patient's personal history of breast carcinoma and prior high risk
lesion biopsies.

BI-RADS CATEGORY  2: Benign.

## 2021-04-24 MED ORDER — GADOBUTROL 1 MMOL/ML IV SOLN
7.0000 mL | Freq: Once | INTRAVENOUS | Status: AC | PRN
  Administered 2021-04-24: 13:00:00 7 mL via INTRAVENOUS

## 2021-04-26 ENCOUNTER — Telehealth: Payer: Self-pay

## 2021-04-26 NOTE — Telephone Encounter (Signed)
Called pt, per Mendel Ryder, MRI shows no cancer.  Pt verbalized understanding and thanks.

## 2021-07-05 ENCOUNTER — Encounter (HOSPITAL_COMMUNITY): Payer: Self-pay

## 2021-09-09 ENCOUNTER — Encounter: Payer: Self-pay | Admitting: Gynecologic Oncology

## 2021-09-12 ENCOUNTER — Other Ambulatory Visit: Payer: Self-pay | Admitting: Chiropractor

## 2021-09-13 ENCOUNTER — Other Ambulatory Visit: Payer: Self-pay

## 2021-09-13 ENCOUNTER — Other Ambulatory Visit: Payer: Self-pay | Admitting: Chiropractor

## 2021-09-13 ENCOUNTER — Other Ambulatory Visit: Payer: Self-pay | Admitting: *Deleted

## 2021-09-13 ENCOUNTER — Inpatient Hospital Stay: Payer: 59 | Attending: Gynecologic Oncology | Admitting: Gynecologic Oncology

## 2021-09-13 ENCOUNTER — Inpatient Hospital Stay: Payer: 59

## 2021-09-13 VITALS — BP 111/60 | HR 72 | Temp 97.9°F | Resp 16 | Ht 62.5 in | Wt 152.0 lb

## 2021-09-13 DIAGNOSIS — Z170 Estrogen receptor positive status [ER+]: Secondary | ICD-10-CM | POA: Insufficient documentation

## 2021-09-13 DIAGNOSIS — K5900 Constipation, unspecified: Secondary | ICD-10-CM | POA: Insufficient documentation

## 2021-09-13 DIAGNOSIS — Z1501 Genetic susceptibility to malignant neoplasm of breast: Secondary | ICD-10-CM

## 2021-09-13 DIAGNOSIS — N393 Stress incontinence (female) (male): Secondary | ICD-10-CM | POA: Diagnosis not present

## 2021-09-13 DIAGNOSIS — N951 Menopausal and female climacteric states: Secondary | ICD-10-CM | POA: Diagnosis not present

## 2021-09-13 DIAGNOSIS — C50412 Malignant neoplasm of upper-outer quadrant of left female breast: Secondary | ICD-10-CM

## 2021-09-13 DIAGNOSIS — R140 Abdominal distension (gaseous): Secondary | ICD-10-CM | POA: Insufficient documentation

## 2021-09-13 DIAGNOSIS — M5030 Other cervical disc degeneration, unspecified cervical region: Secondary | ICD-10-CM

## 2021-09-13 DIAGNOSIS — Z1589 Genetic susceptibility to other disease: Secondary | ICD-10-CM | POA: Diagnosis not present

## 2021-09-13 DIAGNOSIS — Z1509 Genetic susceptibility to other malignant neoplasm: Secondary | ICD-10-CM | POA: Insufficient documentation

## 2021-09-13 DIAGNOSIS — R42 Dizziness and giddiness: Secondary | ICD-10-CM | POA: Diagnosis not present

## 2021-09-13 DIAGNOSIS — Z148 Genetic carrier of other disease: Secondary | ICD-10-CM | POA: Insufficient documentation

## 2021-09-13 DIAGNOSIS — Z1502 Genetic susceptibility to malignant neoplasm of ovary: Secondary | ICD-10-CM | POA: Diagnosis not present

## 2021-09-13 DIAGNOSIS — N898 Other specified noninflammatory disorders of vagina: Secondary | ICD-10-CM | POA: Insufficient documentation

## 2021-09-13 LAB — CBC WITH DIFFERENTIAL (CANCER CENTER ONLY)
Abs Immature Granulocytes: 0.02 10*3/uL (ref 0.00–0.07)
Basophils Absolute: 0 10*3/uL (ref 0.0–0.1)
Basophils Relative: 0 %
Eosinophils Absolute: 0.1 10*3/uL (ref 0.0–0.5)
Eosinophils Relative: 2 %
HCT: 40.5 % (ref 36.0–46.0)
Hemoglobin: 13.6 g/dL (ref 12.0–15.0)
Immature Granulocytes: 0 %
Lymphocytes Relative: 28 %
Lymphs Abs: 2.1 10*3/uL (ref 0.7–4.0)
MCH: 30.6 pg (ref 26.0–34.0)
MCHC: 33.6 g/dL (ref 30.0–36.0)
MCV: 91.2 fL (ref 80.0–100.0)
Monocytes Absolute: 0.5 10*3/uL (ref 0.1–1.0)
Monocytes Relative: 6 %
Neutro Abs: 4.9 10*3/uL (ref 1.7–7.7)
Neutrophils Relative %: 64 %
Platelet Count: 298 10*3/uL (ref 150–400)
RBC: 4.44 MIL/uL (ref 3.87–5.11)
RDW: 11.8 % (ref 11.5–15.5)
WBC Count: 7.6 10*3/uL (ref 4.0–10.5)
nRBC: 0 % (ref 0.0–0.2)

## 2021-09-13 LAB — CMP (CANCER CENTER ONLY)
ALT: 11 U/L (ref 0–44)
AST: 16 U/L (ref 15–41)
Albumin: 4.6 g/dL (ref 3.5–5.0)
Alkaline Phosphatase: 67 U/L (ref 38–126)
Anion gap: 7 (ref 5–15)
BUN: 16 mg/dL (ref 6–20)
CO2: 29 mmol/L (ref 22–32)
Calcium: 10.1 mg/dL (ref 8.9–10.3)
Chloride: 105 mmol/L (ref 98–111)
Creatinine: 0.86 mg/dL (ref 0.44–1.00)
GFR, Estimated: >60 mL/min (ref >60)
Glucose, Bld: 146 mg/dL — ABNORMAL HIGH (ref 70–99)
Potassium: 3.9 mmol/L (ref 3.5–5.1)
Sodium: 141 mmol/L (ref 135–145)
Total Bilirubin: 0.4 mg/dL (ref 0.3–1.2)
Total Protein: 7.3 g/dL (ref 6.5–8.1)

## 2021-09-13 NOTE — Patient Instructions (Signed)
It was good to see you today. ? ?I will be in touch once I have your blood work and ultrasound results. ? ?In the meantime, I will have my office send a referral to the urology office for you to have a discussion about your stress urinary incontinence.  Depending on what treatment option you desire to move forward with, if it happens to be surgery, it would be good to try to set up a joint surgery to have your tubes and ovaries removed at the same time.  Please keep me posted after you see the urologist. ?

## 2021-09-13 NOTE — Progress Notes (Signed)
Gynecologic Oncology Return Clinic Visit ? ?09/13/2021 ? 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 ?Treatment History: ?Oncology History  ?Malignant neoplasm of upper-outer quadrant of left breast in female, estrogen receptor positive ([place])  ?12/19/2019 Initial Diagnosis  ? Screening mammogram showed a possible mass and 2 asymmetries in the left breast. Diagnostic mammogram showed two masses 1.4cm apart in the left breast at the 2:30 position, 0.7cm and 1.1cm, and two likely cysts, a 0.5cm mass a the 3:00 position, and a 0.9cm mass at the 11:00 position. Biopsy showed IDC in both masses at the 2:30 position, grade 3, HER-2 equivocal by IHC (2+), negative by FISH, ER+ 80%, PR- 0%, KI67 90%. ?  ?12/19/2019 Oncotype testing  ? Oncotype DX score 43: Distant recurrence at 9 years 31% ?  ?12/24/2019 Cancer Staging  ? Staging form: Breast, AJCC 8th Edition ?- Clinical stage from 12/24/2019: Stage IB (cT1c, cN0, cM0, G3, ER+, PR-, HER2-) - Signed by Nicholas Lose, MD on 12/24/2019 ? ?  ?04/22/2020 Surgery  ? Left lumpectomy: Multifocal IDC 1.5 cm, grade 3, margins negative, 0/1 lymph node negative, ER 80% weak, PR 0%, HER-2 negative, Ki-67 90% ?  ?04/22/2020 Cancer Staging  ? Staging form: Breast, AJCC 8th Edition ?- Pathologic stage from 04/22/2020: Stage IA (pT1c, pN0, cM0, G3, ER+, PR-, HER2-) - Signed by Gardenia Phlegm, NP on 07/28/2020 ?Stage prefix: Initial diagnosis ?Histologic grading system: 3 grade system ? ?  ?04/26/2020 -  Anti-estrogen oral therapy  ? Anastrozole daily ?  ?05/20/2020 Genetic Testing  ? 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 Testing was completed through the CancerNext-Expanded + RNAinsight offered by [facility] laboratories. The report date is 05/20/2020. ? ?The CancerNext-Expanded + RNAinsight gene panel offered by [employer] and includes sequencing and rearrangement analysis for the  following 77 genes: AIP, ALK, APC, ATM, AXIN2, BAP1, BARD1, BLM, BMPR1A, BRCA1, BRCA2, BRIP1, CDC73, CDH1, CDK4, CDKN1B, CDKN2A, CHEK2, CTNNA1, DICER1, FANCC, FH, FLCN, GALNT12, KIF1B, LZTR1, MAX, MEN1, MET, MLH1, MSH2, MSH3, MSH6, MUTYH, NBN, NF1, NF2, NTHL1, PALB2, PHOX2B, PMS2, POT1, PRKAR1A, PTCH1, PTEN, RAD51C, RAD51D, RB1, RECQL, RET, SDHA, SDHAF2, SDHB, SDHC, SDHD, SMAD4, SMARCA4, SMARCB1, SMARCE1, STK11, SUFU, TMEM127, TP53, TSC1, TSC2, VHL and XRCC2 (sequencing and deletion/duplication); EGFR, EGLN1, HOXB13, KIT, MITF, PDGFRA, POLD1 and POLE (sequencing only); EPCAM and GREM1 (deletion/duplication only). RNA data is routinely analyzed for use in variant interpretation for all genes.  ?  ? ?08/13/20: CA-125 9.9 ?08/27/20: Pelvic ultrasound shows normal-appearing bilateral adnexa, no masses. ? ?Interval History: ?Patient reports overall doing well.  She had hip surgery since I last saw her. ? ?She continues to have intermittent constipation and bloating, which she has had for years.  Denies any recent change to her bowel function.  Continues to struggle with stress urinary incontinence, now has to wear a thicker liner.  Endorses a good appetite, denies any nausea or emesis.  Gained some weight over the holidays, has recently decreased her weight again.  Denies any vaginal bleeding.  Has intermittent vaginal discharge. ? ?Has been struggling more recently with some intermittent dizziness, which she thinks may be related to nerve issue in her neck or some dental work that she needs to have done. ? ?Past Medical/Surgical History: ?Past Medical History:  ?Diagnosis Date  ? Anxiety   ? Arthritis   ? neck and right hip  ? Cancer [facility])   ? breast  ? Carpal tunnel  syndrome   ? right arm  ? Constipation   ? Family history of colon cancer   ? Family history of leukemia   ? History of IBS   ? History of kidney stones   ? Hx of degenerative disc disease   ? Stage 2  ? 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 ? ?Past Surgical History:   ?Procedure Laterality Date  ? BREAST BIOPSY Left 12/15/2019  ? x2  ? BREAST BIOPSY Left 01/20/2020  ? x2  ? BREAST BIOPSY Right 01/30/2020  ? x2  ? BREAST BIOPSY Left 02/13/2020  ? BREAST LUMPECTOMY Left 04/22/2020  ? BREAST LUMPECTOMY WITH RADIOACTIVE SEED LOCALIZATION Left 04/22/2020  ? Procedure: LEFT BREAST LUMPECTOMY WITH RADIOACTIVE SEED LOCALIZATION TIMES TWO;  Surgeon: Alphonsa Overall, MD;  Location: [place];  Service: General;  Laterality: Left;  LEFT PEC BLOCK  ? CESAREAN SECTION    ? COLONOSCOPY    ? FRACTURE SURGERY    ? left hand 5th finger  ? LITHOTRIPSY  2000  ? polyp removal    ? TOTAL HIP ARTHROPLASTY  12/2020  ? TUBAL LIGATION    ? ? ?Family History  ?Problem Relation Age of Onset  ? Heart attack Paternal Grandmother   ? Colon cancer Paternal Grandfather   ?     dx unknown age  ? Leukemia Paternal Aunt 43  ? Cancer Paternal Uncle 6  ?     unknown cancer type  ? ? ?Social History  ? ?Socioeconomic History  ? Marital status: Married  ?  Spouse name: Not on file  ? Number of children: Not on file  ? Years of education: Not on file  ? Highest education level: Not on file  ?Occupational History  ? Occupation: cleaner  ?Tobacco Use  ? Smoking status: Former  ?  Types: Cigarettes  ?  Quit date: 12/23/1988  ?  Years since quitting: 32.7  ? Smokeless tobacco: Never  ?Vaping Use  ? Vaping Use: Never used  ?Substance and Sexual Activity  ? Alcohol use: Yes  ?  Comment: one a day  ? Drug use: Yes  ?  Types: Marijuana  ?  Comment: couple times a week  ? Sexual activity: Yes  ?  Birth control/protection: Post-menopausal  ?Other Topics Concern  ? Not on file  ?Social History Narrative  ? Not on file  ? ?Social Determinants of Health  ? ?Financial Resource Strain: Not on file  ?Food Insecurity: Not on file  ?Transportation Needs: Not on file  ?Physical Activity: Not on file  ?Stress: Not on file  ?Social Connections: Not on file  ? ? ?Current Medications: ? ?Current Outpatient Medications:  ?  acetaminophen (TYLENOL)  500 MG tablet, Take 1,000 mg by mouth every 6 (six) hours as needed for moderate pain. , Disp: , Rfl:  ?  ALPRAZolam (XANAX) 0.5 MG tablet, Take 0.25 mg by mouth daily as needed for anxiety., Disp: , Rfl:  ?  amphetamine-dextroamphetamine (ADDERALL) 30 MG tablet, TAKE 1/2 TABLET BY MOUTH EVERY DAY AT 7 AM, 12 NOON, AND 4 PM 30 DAYS, Disp: , Rfl:  ?  APPLE CIDER VINEGAR PO, Take 400 mg by mouth daily., Disp: , Rfl:  ?  ascorbic acid (VITAMIN C) 1000 MG tablet, Take 2,000 mg by mouth daily., Disp: , Rfl:  ?  b complex vitamins capsule, Take 1 capsule by mouth daily., Disp: , Rfl:  ?  diphenhydrAMINE (BENADRYL) 25 MG tablet, Take 25 mg by mouth daily  as needed for allergies., Disp: , Rfl:  ?  HYDROcodone-acetaminophen (NORCO/VICODIN) 5-325 MG tablet, Take 0.5 tablets by mouth 2 (two) times daily as needed for moderate pain., Disp: , Rfl:  ?  meloxicam (MOBIC) 15 MG tablet, Take 15 mg by mouth daily as needed for pain. , Disp: , Rfl:  ?  TURMERIC PO, Take 1 capsule by mouth daily., Disp: , Rfl:  ?  Vitamin D-Vitamin K (K2 PLUS D3 PO), Take by mouth., Disp: , Rfl:  ?  cholecalciferol (VITAMIN D3) 25 MCG (1000 UNIT) tablet, Take 1,000 Units by mouth daily. (Patient not taking: Reported on 09/09/2021), Disp: , Rfl:  ? ?Review of Systems: ?Pertinent positives include fatigue, ringing in ears, vision problems, bloating, constipation, urinary incontinence, hot flashes, vaginal discharge, joint pain, back pain, dizziness, numbness. ?Denies appetite changes, fevers, chills, unexplained weight changes. ?Denies hearing loss, neck lumps or masses, mouth sores or voice changes. ?Denies cough or wheezing.  Denies shortness of breath. ?Denies chest pain or palpitations. Denies leg swelling. ?Denies abdominal pain, blood in stools, diarrhea, nausea, vomiting, or early satiety. ?Denies pain with intercourse, dysuria, frequency, hematuria. ?Denies pelvic pain, vaginal bleeding.   ?Denies muscle pain/cramps. ?Denies itching, rash, or  wounds. ?Denies headaches or seizures. ?Denies swollen lymph nodes or glands, denies easy bruising or bleeding. ?Denies anxiety, depression, confusion, or decreased concentration. ? ?Physical Exam: ?BP 111/60 (B

## 2021-09-14 LAB — CA 125: Cancer Antigen (CA) 125: 9.5 U/mL (ref 0.0–38.1)

## 2021-09-14 NOTE — Assessment & Plan Note (Addendum)
12/19/2019:?Screening mammogram showed a possible mass and 2 asymmetries in the left breast. Diagnostic mammogram showed two masses 1.4cm apart in the left breast at the 2:30 position, 0.7cm and 1.1cm, ?Biopsy showed IDC in both masses at the 2:30 position, grade 3, HER-2 equivocal by IHC (2+), negative by FISH, ER+ 80%, PR- 0%, KI67 90%. ?T1?cN 0 stage Ib clinical stage ?Oncotype DX: 43, risk of distant recurrence at 9 years: 31%: High risk ?CT CAP and bone scan: No metastatic disease ?Breast MRI 01/02/2020: 4.5 cm right breast mass at 12:00, non-mass enhancement 3.7 cm 12:00, 2 previously known malignancies 1.2 cm, 6 mm enhancement, 8 mm?NME?left breast (recommended biopsies) ?? ?Treatment plan: ?1. 04/22/2020 Left lumpectomy: Multifocal IDC 1.5 cm, grade 3, margins negative, 0/1 lymph node negative, ER 80% weak, PR 0%, HER-2 negative, Ki-67 90% ?2.?adjuvant chemotherapy with Taxotere and Cytoxan every 3 weeks x4 cycles (patient refused) ?3. Adjuvant radiation therapy (patient refused) ?4. Adjuvant antiestrogen therapy (patient refused) ?-------------------------------------------------------------------------------------------------------------------------- ?Breast cancer surveillance: ?1.  Breast exam 09/15/2021: Benign ?2. mammogram 12/14/2020: Benign breast density category C ?3.  Breast MRI 04/25/2021: Benign breast density category B ? ?Shingles: I sent a prescription for Valtrex. ? ?Because of her genetic mutation BRI P-1, we recommend annual MRIs and she is also contemplating on oophorectomy. ? ?Return to clinic in 1 year for follow-up ?

## 2021-09-15 ENCOUNTER — Inpatient Hospital Stay (HOSPITAL_BASED_OUTPATIENT_CLINIC_OR_DEPARTMENT_OTHER): Payer: 59 | Admitting: Hematology and Oncology

## 2021-09-15 ENCOUNTER — Other Ambulatory Visit: Payer: Self-pay

## 2021-09-15 ENCOUNTER — Ambulatory Visit (HOSPITAL_COMMUNITY): Payer: 59

## 2021-09-15 ENCOUNTER — Ambulatory Visit (HOSPITAL_COMMUNITY)
Admission: RE | Admit: 2021-09-15 | Discharge: 2021-09-15 | Disposition: A | Discharge status: Home / Self Care | Payer: 59 | Source: Ambulatory Visit | Attending: Gynecologic Oncology | Admitting: Gynecologic Oncology

## 2021-09-15 ENCOUNTER — Inpatient Hospital Stay: Payer: 59

## 2021-09-15 VITALS — BP 111/66 | HR 88 | Temp 97.3°F | Resp 18 | Ht 62.5 in | Wt 156.1 lb

## 2021-09-15 DIAGNOSIS — Z1501 Genetic susceptibility to malignant neoplasm of breast: Secondary | ICD-10-CM | POA: Diagnosis not present

## 2021-09-15 DIAGNOSIS — Z170 Estrogen receptor positive status [ER+]: Secondary | ICD-10-CM

## 2021-09-15 DIAGNOSIS — C50412 Malignant neoplasm of upper-outer quadrant of left female breast: Secondary | ICD-10-CM

## 2021-09-15 DIAGNOSIS — Z1589 Genetic susceptibility to other disease: Secondary | ICD-10-CM | POA: Diagnosis not present

## 2021-09-15 IMAGING — US US PELVIS COMPLETE WITH TRANSVAGINAL
1 series · 15 of 25 positions shown · non-contrast
Comparison: Pelvic ultrasound [DATE]

CLINICAL DATA: Genetic mutation

EXAM:
TRANSABDOMINAL AND TRANSVAGINAL ULTRASOUND OF PELVIS
TECHNIQUE: Both transabdominal and transvaginal ultrasound examinations of the
pelvis were performed. Transabdominal technique was performed for
global imaging of the pelvis including uterus, ovaries, adnexal
regions, and pelvic cul-de-sac. It was necessary to proceed with
endovaginal exam following the transabdominal exam to visualize the
endometrium and adnexa.

[Series 1: us pelvis complete mc & wl · 15 of 64 slices shown]
[im 1/64]
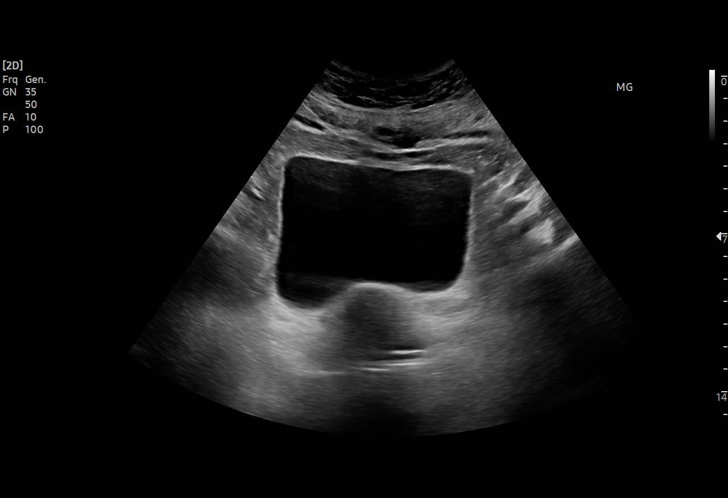
[im 6/64]
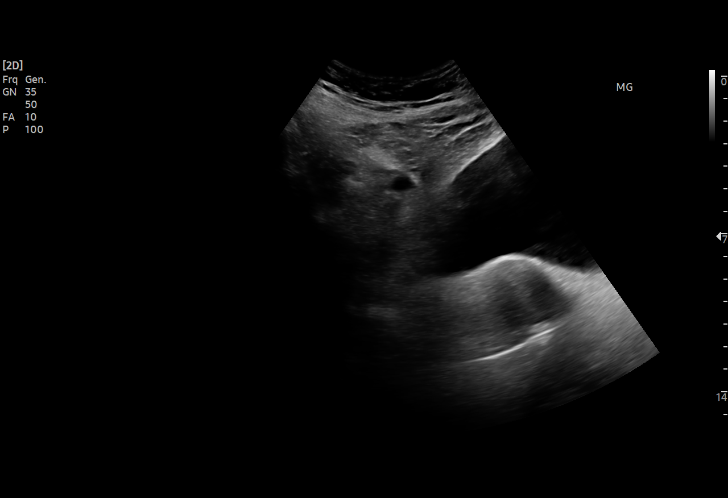
[im 11/64]
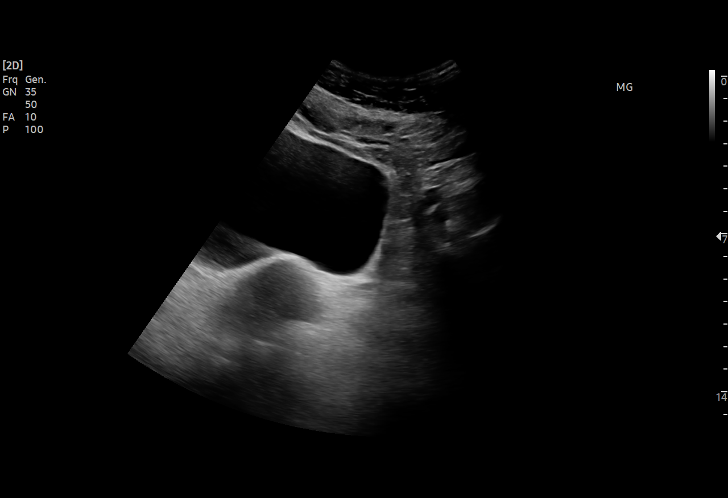
[im 14/64]
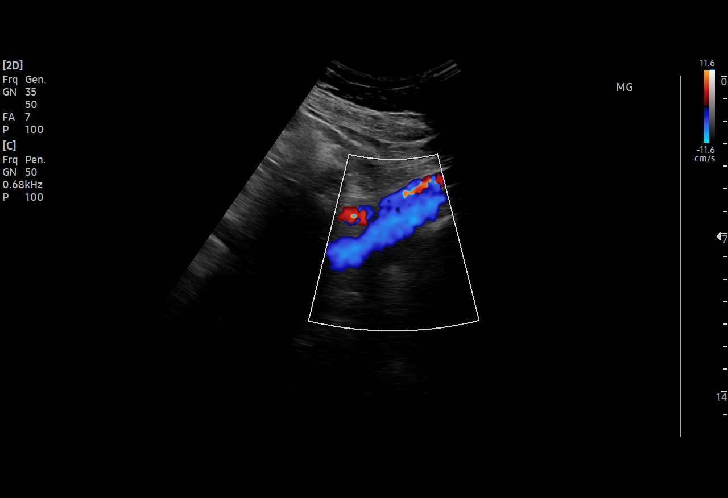
[im 19/64]
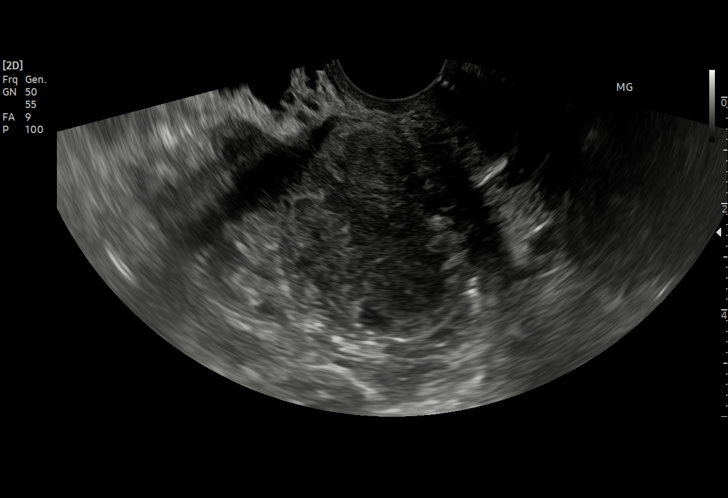
[im 24/64]
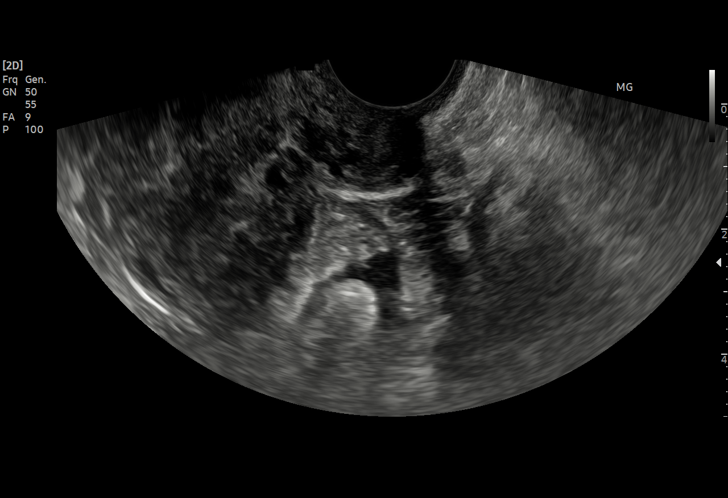
[im 27/64]
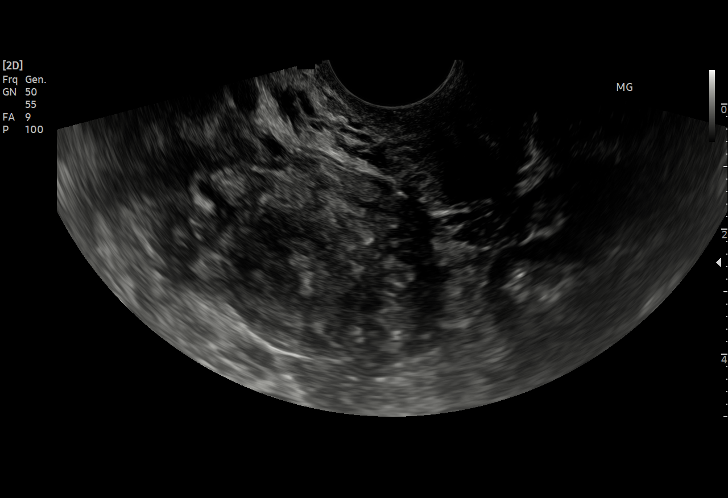
[im 32/64]
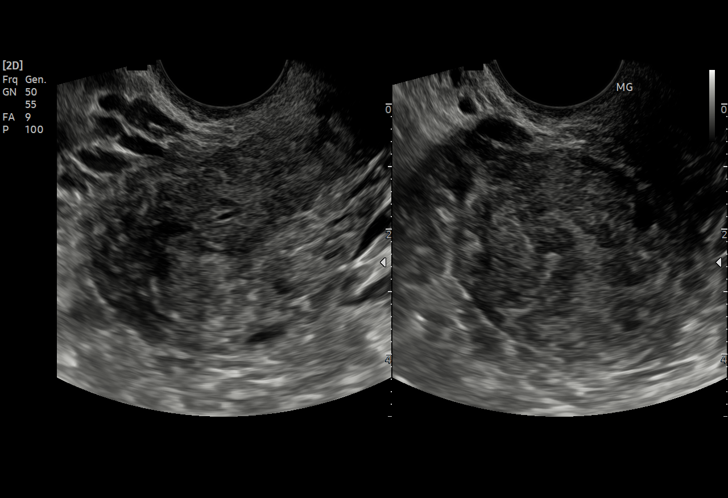
[im 37/64]
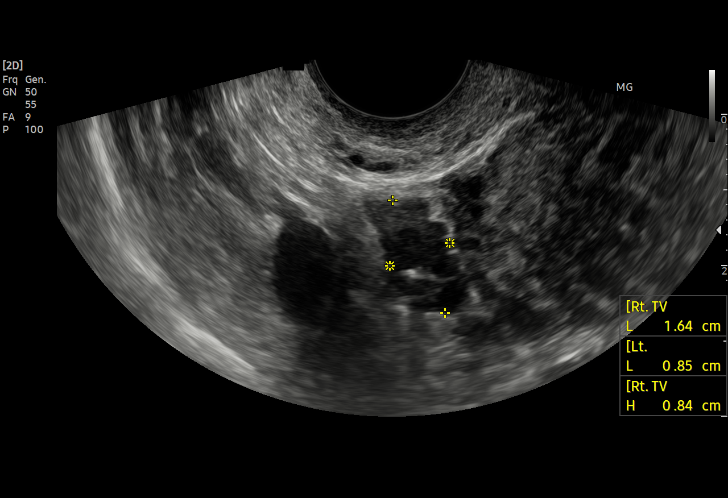
[im 40/64]
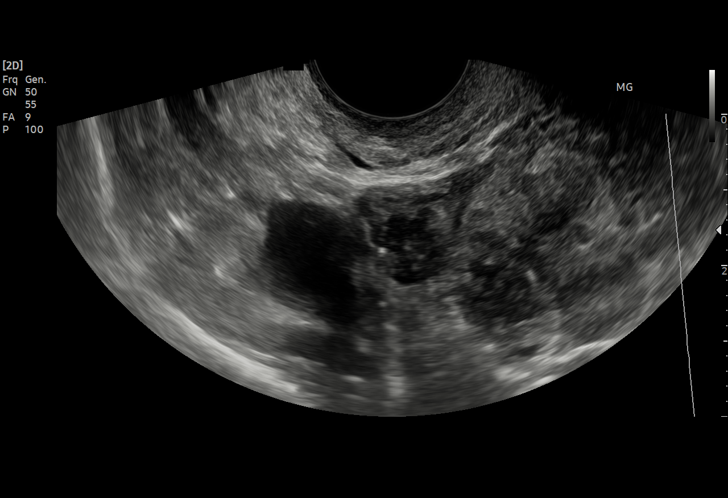
[im 45/64]
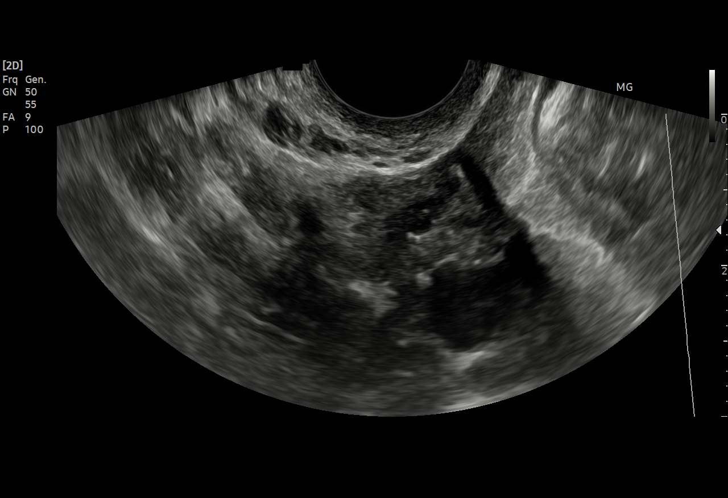
[im 50/64]
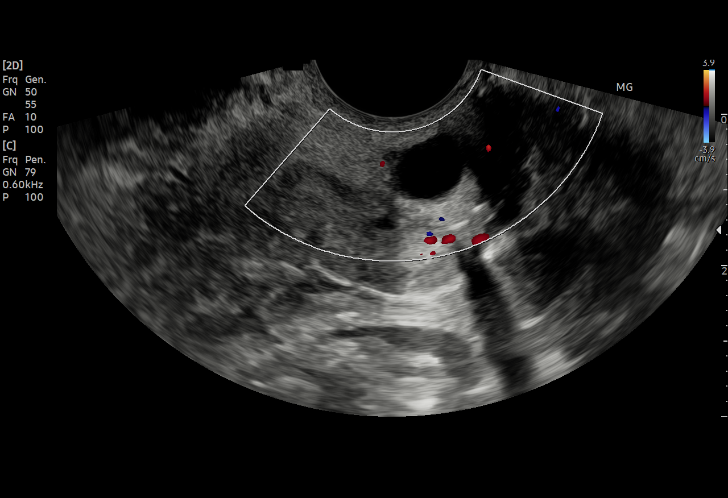
[im 53/64]
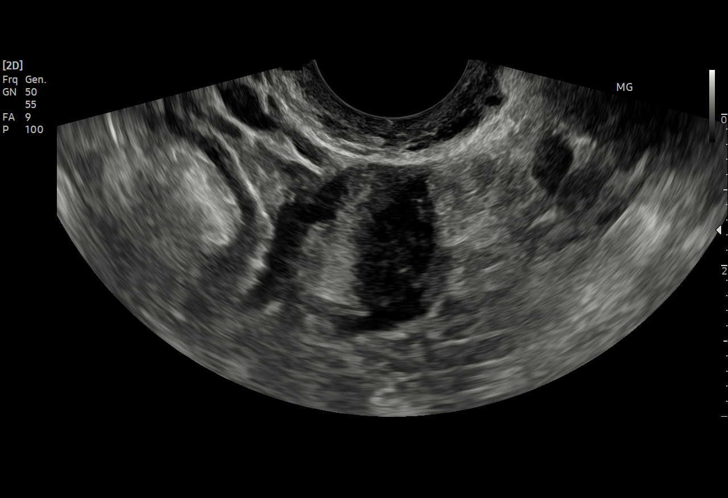
[im 58/64]
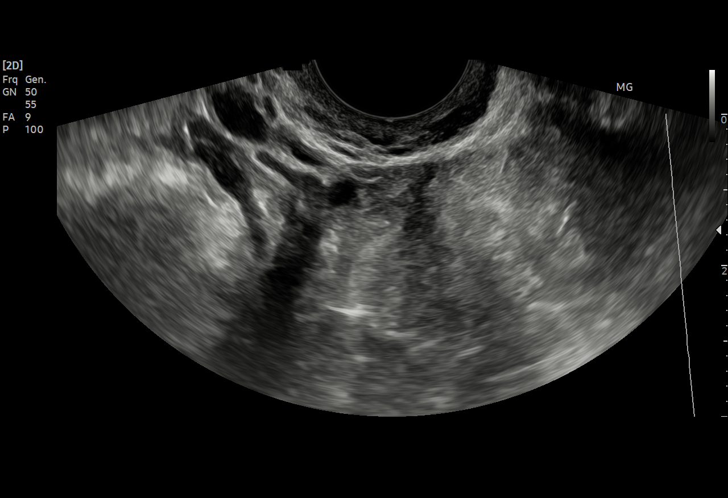
[im 64/64]
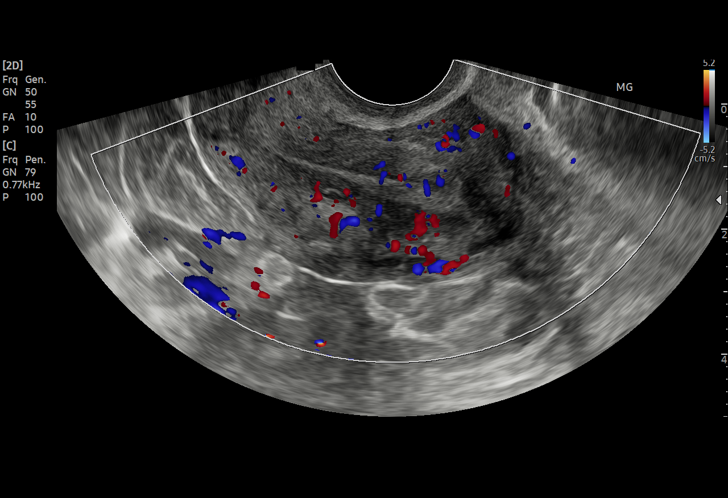

[15 of 25 positions shown; findings below may reference images not displayed]

FINDINGS: Uterus

Measurements: 4.1 x 3.3 x 4.1 cm = volume: 29 mL. Diffusely
heterogeneous including a 1.8 cm intramural fibroid near the fundus.

Endometrium

Thickness: 6 mm.  No focal abnormality visualized.

Right ovary

Measurements: 1.6 x 0.8 x 1.8 cm = volume: 2 mL. Grossly normal
appearance/no adnexal mass.

Left ovary

Measurements: 1.9 x 1.1 x 0.8 cm = volume: 1 mL. Grossly normal
appearance/no adnexal mass.

Other findings

No abnormal free fluid.
IMPRESSION: Heterogeneous myometrium including a small fibroid.

## 2021-09-15 MED ORDER — VALACYCLOVIR HCL 1 G PO TABS: 1000.0000 mg | ORAL_TABLET | Freq: Two times a day (BID) | ORAL | 0 refills | Status: DC

## 2021-09-15 NOTE — Progress Notes (Signed)
? ?Patient Care Team: ?Maury Dus, MD as PCP - General (Family Medicine) ?Nicholas Lose, MD as Consulting Physician (Hematology and Oncology) ?Kyung Rudd, MD as Consulting Physician (Radiation Oncology) ?Vania Rea, MD as Consulting Physician (Obstetrics and Gynecology) ?Lavonna Monarch, MD as Consulting Physician (Dermatology) ? ?DIAGNOSIS:  ?Encounter Diagnoses  ?Name Primary?  ? Malignant neoplasm of upper-outer quadrant of left breast in female, estrogen receptor positive ([place])   ? Monoallelic mutation of BRIP1 gene Yes  ? ? ?SUMMARY OF ONCOLOGIC HISTORY: ?Oncology History  ?Malignant neoplasm of upper-outer quadrant of left breast in female, estrogen receptor positive ([place])  ?12/19/2019 Initial Diagnosis  ? Screening mammogram showed a possible mass and 2 asymmetries in the left breast. Diagnostic mammogram showed two masses 1.4cm apart in the left breast at the 2:30 position, 0.7cm and 1.1cm, and two likely cysts, a 0.5cm mass a the 3:00 position, and a 0.9cm mass at the 11:00 position. Biopsy showed IDC in both masses at the 2:30 position, grade 3, HER-2 equivocal by IHC (2+), negative by FISH, ER+ 80%, PR- 0%, KI67 90%. ?  ?12/19/2019 Oncotype testing  ? Oncotype DX score 43: Distant recurrence at 9 years 31% ?  ?12/24/2019 Cancer Staging  ? Staging form: Breast, AJCC 8th Edition ?- Clinical stage from 12/24/2019: Stage IB (cT1c, cN0, cM0, G3, ER+, PR-, HER2-) - Signed by Nicholas Lose, MD on 12/24/2019 ? ?  ?04/22/2020 Surgery  ? Left lumpectomy: Multifocal IDC 1.5 cm, grade 3, margins negative, 0/1 lymph node negative, ER 80% weak, PR 0%, HER-2 negative, Ki-67 90% ?  ?04/22/2020 Cancer Staging  ? Staging form: Breast, AJCC 8th Edition ?- Pathologic stage from 04/22/2020: Stage IA (pT1c, pN0, cM0, G3, ER+, PR-, HER2-) - Signed by Gardenia Phlegm, NP on 07/28/2020 ?Stage prefix: Initial diagnosis ?Histologic grading system: 3 grade system ? ?  ?04/26/2020 -  Anti-estrogen oral therapy  ? Anastrozole daily ?   ?05/20/2020 Genetic Testing  ? 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 Testing was completed through the CancerNext-Expanded + RNAinsight offered by [facility] laboratories. The report date is 05/20/2020. ? ?The CancerNext-Expanded + RNAinsight gene panel offered by [employer] and includes sequencing and rearrangement analysis for the following 77 genes: AIP, ALK, APC, ATM, AXIN2, BAP1, BARD1, BLM, BMPR1A, BRCA1, BRCA2, BRIP1, CDC73, CDH1, CDK4, CDKN1B, CDKN2A, CHEK2, CTNNA1, DICER1, FANCC, FH, FLCN, GALNT12, KIF1B, LZTR1, MAX, MEN1, MET, MLH1, MSH2, MSH3, MSH6, MUTYH, NBN, NF1, NF2, NTHL1, PALB2, PHOX2B, PMS2, POT1, PRKAR1A, PTCH1, PTEN, RAD51C, RAD51D, RB1, RECQL, RET, SDHA, SDHAF2, SDHB, SDHC, SDHD, SMAD4, SMARCA4, SMARCB1, SMARCE1, STK11, SUFU, TMEM127, TP53, TSC1, TSC2, VHL and XRCC2 (sequencing and deletion/duplication); EGFR, EGLN1, HOXB13, KIT, MITF, PDGFRA, POLD1 and POLE (sequencing only); EPCAM and GREM1 (deletion/duplication only). RNA data is routinely analyzed for use in variant interpretation for all genes.  ?  ? ? ?CHIEF COMPLIANT: Follow-up after recent left lumpectomy on anastrozole ? ?INTERVAL HISTORY: Beverly Mcclain is a  61 year old with above-mentioned history of left breast cancer underwent lumpectomy on 04/22/2020.  She presents to the clinic today for a follow-up. She state that she had shingles and its on the worsening stage. She states today makes day 3.  She decided not to take anastrozole therapy because of concern for adverse effects. ? ? ?ALLERGIES:  is allergic to ibuprofen, naproxen sodium, and tuberculin tests. ? ?MEDICATIONS:  ?Current Outpatient Medications  ?Medication Sig Dispense Refill  ? valACYclovir (VALTREX) 1000 MG tablet Take 1  tablet (1,000 mg total) by mouth 2 (two) times daily. 14 tablet 0  ? acetaminophen (TYLENOL) 500 MG tablet Take 1,000 mg by mouth every 6 (six) hours as needed  for moderate pain.     ? ALPRAZolam (XANAX) 0.5 MG tablet Take 0.25 mg by mouth daily as needed for anxiety.    ? amphetamine-dextroamphetamine (ADDERALL) 30 MG tablet TAKE 1/2 TABLET BY MOUTH EVERY DAY AT 7 AM, 12 NOON, AND 4 PM 30 DAYS    ? APPLE CIDER VINEGAR PO Take 400 mg by mouth daily.    ? ascorbic acid (VITAMIN C) 1000 MG tablet Take 2,000 mg by mouth daily.    ? b complex vitamins capsule Take 1 capsule by mouth daily.    ? cholecalciferol (VITAMIN D3) 25 MCG (1000 UNIT) tablet Take 1,000 Units by mouth daily. (Patient not taking: Reported on 09/09/2021)    ? diphenhydrAMINE (BENADRYL) 25 MG tablet Take 25 mg by mouth daily as needed for allergies.    ? HYDROcodone-acetaminophen (NORCO/VICODIN) 5-325 MG tablet Take 0.5 tablets by mouth 2 (two) times daily as needed for moderate pain.    ? meloxicam (MOBIC) 15 MG tablet Take 15 mg by mouth daily as needed for pain.     ? TURMERIC PO Take 1 capsule by mouth daily.    ? Vitamin D-Vitamin K (K2 PLUS D3 PO) Take by mouth.    ? ?No current facility-administered medications for this visit.  ? ? ?PHYSICAL EXAMINATION: ?ECOG PERFORMANCE STATUS: 1 - Symptomatic but completely ambulatory ? ?Vitals:  ? 09/15/21 0834  ?BP: 111/66  ?Pulse: 88  ?Resp: 18  ?Temp: (!) 97.3 ?F (36.3 ?C)  ?SpO2: 98%  ? ?Filed Weights  ? 09/15/21 0834  ?Weight: 156 lb 1.6 oz (70.8 kg)  ? ? ?BREAST: No palpable masses or nodules in either right or left breasts. No palpable axillary supraclavicular or infraclavicular adenopathy no breast tenderness or nipple discharge. (exam performed in the presence of a chaperone) ? ?LABORATORY DATA:  ?I have reviewed the data as listed ? ?  Latest Ref Rng & Units 09/13/2021  ?  4:06 PM 04/07/2020  ?  3:58 PM 12/24/2019  ?  8:31 AM  ?CMP  ?Glucose 70 - 99 mg/dL 146   91   92    ?BUN 6 - 20 mg/dL 16   18   14     ?Creatinine 0.44 - 1.00 mg/dL 0.86   0.77   0.80    ?Sodium 135 - 145 mmol/L 141   140   140    ?Potassium 3.5 - 5.1 mmol/L 3.9   4.2   4.1    ?Chloride  98 - 111 mmol/L 105   106   107    ?CO2 22 - 32 mmol/L 29   27   26     ?Calcium 8.9 - 10.3 mg/dL 10.1   9.4   10.0    ?Total Protein 6.5 - 8.1 g/dL 7.3   7.0   7.1    ?Total Bilirubin 0.3 - 1.2 mg/dL 0.4   0.5   0.3    ?Alkaline Phos 38 - 126 U/L 67   65   82    ?AST 15 - 41 U/L 16   20   15     ?ALT 0 - 44 U/L 11   14   12     ? ? ?Lab Results  ?Component Value Date  ? WBC 7.6 09/13/2021  ? HGB 13.6 09/13/2021  ? HCT  40.5 09/13/2021  ? MCV 91.2 09/13/2021  ? PLT 298 09/13/2021  ? NEUTROABS 4.9 09/13/2021  ? ? ?ASSESSMENT & PLAN:  ?Malignant neoplasm of upper-outer quadrant of left breast in female, estrogen receptor positive ([place]) ?12/19/2019: Screening mammogram showed a possible mass and 2 asymmetries in the left breast. Diagnostic mammogram showed two masses 1.4cm apart in the left breast at the 2:30 position, 0.7cm and 1.1cm,  Biopsy showed IDC in both masses at the 2:30 position, grade 3, HER-2 equivocal by IHC (2+), negative by FISH, ER+ 80%, PR- 0%, KI67 90%. ?T1 cN 0 stage Ib clinical stage ?Oncotype DX: 43, risk of distant recurrence at 9 years: 31%: High risk ?CT CAP and bone scan: No metastatic disease ?Breast MRI 01/02/2020: 4.5 cm right breast mass at 12:00, non-mass enhancement 3.7 cm 12:00, 2 previously known malignancies 1.2 cm, 6 mm enhancement, 8 mm NME left breast (recommended biopsies) ?  ?Treatment plan: ?1. 04/22/2020 Left lumpectomy: Multifocal IDC 1.5 cm, grade 3, margins negative, 0/1 lymph node negative, ER 80% weak, PR 0%, HER-2 negative, Ki-67 90% ?2. adjuvant chemotherapy with Taxotere and Cytoxan every 3 weeks x4 cycles (patient refused) ?3. Adjuvant radiation therapy (patient refused) ?4. Adjuvant antiestrogen therapy (patient refused) ?-------------------------------------------------------------------------------------------------------------------------- ?Breast cancer surveillance: ?1.  Breast exam 09/15/2021: Benign ?2. mammogram 12/14/2020: Benign breast density category C ?3.  Breast  MRI 04/25/2021: Benign breast density category B ? ?Shingles: I sent a prescription for Valtrex. ? ?Because of her genetic mutation BRI P-1, we recommend annual MRIs and she is also contemplating on o

## 2021-09-16 ENCOUNTER — Encounter: Payer: Self-pay | Admitting: Gynecologic Oncology

## 2021-09-16 NOTE — Telephone Encounter (Signed)
Spoke with Beverly Mcclain this morning regarding her mychart message about lower back pain. Per NP advised that back pain is most likely not from her uterus. A small fibroid was seen on ultrasound but it should not be causing symptoms. Patient verbalized understanding.  ?" I'm wondering if it could be from an infection?" ?Patient denies difficulty urinating, pain with urination, odor, urgency, fever or chills. She reports having a history of stress incontinence for which she wears panty liners daily. She states "I think I have a little vaginal discharge. I see a yellowish discharge on the panty liner." She reports the discharge is thin in consistency and has been on-going and is not new. Denies vaginal odor. She has been having more hot flashes recently but no fever or chills.  ?" I do currently have shingles again and my symptoms are pretty bad this time. 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 Per NP instructed she needs to have supportive shoes and to try wearing compression socks as this may help. Provided patient with resources where she can get fitted for supportive shoes. Instructed patient to monitor her low back pain. If after the shingles clears up and she has tried the supportive shoes her back pain remains to contact our office. Patient verbalized understanding.  ?

## 2021-10-31 ENCOUNTER — Other Ambulatory Visit: Payer: Self-pay | Admitting: Hematology and Oncology

## 2021-10-31 DIAGNOSIS — Z9889 Other specified postprocedural states: Secondary | ICD-10-CM

## 2021-12-12 ENCOUNTER — Ambulatory Visit: Payer: 59 | Admitting: Dermatology

## 2021-12-16 ENCOUNTER — Ambulatory Visit
Admission: RE | Admit: 2021-12-16 | Discharge: 2021-12-16 | Disposition: A | Discharge status: Home / Self Care | Payer: 59 | Source: Ambulatory Visit | Attending: Hematology and Oncology | Admitting: Hematology and Oncology

## 2021-12-16 DIAGNOSIS — Z9889 Other specified postprocedural states: Secondary | ICD-10-CM

## 2022-05-18 ENCOUNTER — Telehealth: Payer: Self-pay | Admitting: Hematology and Oncology

## 2022-05-18 NOTE — Telephone Encounter (Signed)
Patient called to schedule mri, transferred patient to scheduling line.

## 2022-05-21 ENCOUNTER — Ambulatory Visit (HOSPITAL_COMMUNITY)
Admission: RE | Admit: 2022-05-21 | Discharge: 2022-05-21 | Disposition: A | Discharge status: Home / Self Care | Payer: 59 | Source: Ambulatory Visit | Attending: Hematology and Oncology | Admitting: Hematology and Oncology

## 2022-05-21 DIAGNOSIS — Z1589 Genetic susceptibility to other disease: Secondary | ICD-10-CM | POA: Insufficient documentation

## 2022-05-21 DIAGNOSIS — Z170 Estrogen receptor positive status [ER+]: Secondary | ICD-10-CM | POA: Insufficient documentation

## 2022-05-21 DIAGNOSIS — C50412 Malignant neoplasm of upper-outer quadrant of left female breast: Secondary | ICD-10-CM | POA: Insufficient documentation

## 2022-05-21 DIAGNOSIS — Z1501 Genetic susceptibility to malignant neoplasm of breast: Secondary | ICD-10-CM | POA: Insufficient documentation

## 2022-05-21 MED ORDER — GADOBUTROL 1 MMOL/ML IV SOLN
7.0000 mL | Freq: Once | INTRAVENOUS | Status: AC | PRN
  Administered 2022-05-21: 7 mL via INTRAVENOUS

## 2022-09-17 NOTE — Assessment & Plan Note (Signed)
12/19/2019: Screening mammogram showed a possible mass and 2 asymmetries in the left breast. Diagnostic mammogram showed two masses 1.4cm apart in the left breast at the 2:30 position, 0.7cm and 1.1cm,  Biopsy showed IDC in both masses at the 2:30 position, grade 3, HER-2 equivocal by IHC (2+), negative by FISH, ER+ 80%, PR- 0%, KI67 90%. T1 cN 0 stage Ib clinical stage Oncotype DX: 43, risk of distant recurrence at 9 years: 31%: High risk CT CAP and bone scan: No metastatic disease Breast MRI 01/02/2020: 4.5 cm right breast mass at 12:00, non-mass enhancement 3.7 cm 12:00, 2 previously known malignancies 1.2 cm, 6 mm enhancement, 8 mm NME left breast (recommended biopsies)   Treatment plan: 1. 04/22/2020 Left lumpectomy: Multifocal IDC 1.5 cm, grade 3, margins negative, 0/1 lymph node negative, ER 80% weak, PR 0%, HER-2 negative, Ki-67 90% 2. adjuvant chemotherapy with Taxotere and Cytoxan every 3 weeks x4 cycles (patient refused) 3. Adjuvant radiation therapy (patient refused) 4. Adjuvant antiestrogen therapy (patient refused) -------------------------------------------------------------------------------------------------------------------------- Breast cancer surveillance: 1.  Breast exam 09/18/2022: Benign 2. mammogram 12/16/2021: Benign breast density category C 3.  Breast MRI 05/24/22: Benign breast density category B   Because of her genetic mutation BRI P-1, we recommend annual MRIs and she is also contemplating on oophorectomy.   Return to clinic in 1 year for follow-up

## 2022-09-18 ENCOUNTER — Other Ambulatory Visit: Payer: Self-pay

## 2022-09-18 ENCOUNTER — Inpatient Hospital Stay: Payer: 59 | Attending: Hematology and Oncology | Admitting: Hematology and Oncology

## 2022-09-18 VITALS — BP 107/74 | HR 64 | Temp 97.9°F | Resp 18 | Ht 62.5 in | Wt 147.0 lb

## 2022-09-18 DIAGNOSIS — Z170 Estrogen receptor positive status [ER+]: Secondary | ICD-10-CM | POA: Diagnosis not present

## 2022-09-18 DIAGNOSIS — C50412 Malignant neoplasm of upper-outer quadrant of left female breast: Secondary | ICD-10-CM | POA: Diagnosis not present

## 2022-09-18 DIAGNOSIS — Z79811 Long term (current) use of aromatase inhibitors: Secondary | ICD-10-CM | POA: Diagnosis not present

## 2022-09-18 NOTE — Progress Notes (Signed)
Patient Care Team: Elias Else, MD (Inactive) as PCP - General (Family Medicine) Serena Croissant, MD as Consulting Physician (Hematology and Oncology) Dorothy Puffer, MD as Consulting Physician (Radiation Oncology) Annamaria Helling, MD as Consulting Physician (Obstetrics and Gynecology) Janalyn Harder, MD (Inactive) as Consulting Physician (Dermatology)  DIAGNOSIS:  Encounter Diagnosis  Name Primary?   Malignant neoplasm of upper-outer quadrant of left breast in female, estrogen receptor positive (HCC) Yes    SUMMARY OF ONCOLOGIC HISTORY: Oncology History  Malignant neoplasm of upper-outer quadrant of left breast in female, estrogen receptor positive (HCC)  12/19/2019 Initial Diagnosis   Screening mammogram showed a possible mass and 2 asymmetries in the left breast. Diagnostic mammogram showed two masses 1.4cm apart in the left breast at the 2:30 position, 0.7cm and 1.1cm, and two likely cysts, a 0.5cm mass a the 3:00 position, and a 0.9cm mass at the 11:00 position. Biopsy showed IDC in both masses at the 2:30 position, grade 3, HER-2 equivocal by IHC (2+), negative by FISH, ER+ 80%, PR- 0%, KI67 90%.   12/19/2019 Oncotype testing   Oncotype DX score 43: Distant recurrence at 9 years 31%   12/24/2019 Cancer Staging   Staging form: Breast, AJCC 8th Edition - Clinical stage from 12/24/2019: Stage IB (cT1c, cN0, cM0, G3, ER+, PR-, HER2-) - Signed by Serena Croissant, MD on 12/24/2019   04/22/2020 Surgery   Left lumpectomy: Multifocal IDC 1.5 cm, grade 3, margins negative, 0/1 lymph node negative, ER 80% weak, PR 0%, HER-2 negative, Ki-67 90%   04/22/2020 Cancer Staging   Staging form: Breast, AJCC 8th Edition - Pathologic stage from 04/22/2020: Stage IA (pT1c, pN0, cM0, G3, ER+, PR-, HER2-) - Signed by Loa Socks, NP on 07/28/2020 Stage prefix: Initial diagnosis Histologic grading system: 3 grade system   04/26/2020 -  Anti-estrogen oral therapy   Anastrozole daily   05/20/2020 Genetic  Testing   Positive genetic testing:  A single, 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 Testing was completed through the CancerNext-Expanded + RNAinsight offered by [facility] laboratories. The report date is 05/20/2020.  The CancerNext-Expanded + RNAinsight gene panel offered by [employer] and includes sequencing and rearrangement analysis for the following 77 genes: AIP, ALK, APC, ATM, AXIN2, BAP1, BARD1, BLM, BMPR1A, BRCA1, BRCA2, BRIP1, CDC73, CDH1, CDK4, CDKN1B, CDKN2A, CHEK2, CTNNA1, DICER1, FANCC, FH, FLCN, GALNT12, KIF1B, LZTR1, MAX, MEN1, MET, MLH1, MSH2, MSH3, MSH6, MUTYH, NBN, NF1, NF2, NTHL1, PALB2, PHOX2B, PMS2, POT1, PRKAR1A, PTCH1, PTEN, RAD51C, RAD51D, RB1, RECQL, RET, SDHA, SDHAF2, SDHB, SDHC, SDHD, SMAD4, SMARCA4, SMARCB1, SMARCE1, STK11, SUFU, TMEM127, TP53, TSC1, TSC2, VHL and XRCC2 (sequencing and deletion/duplication); EGFR, EGLN1, HOXB13, KIT, MITF, PDGFRA, POLD1 and POLE (sequencing only); EPCAM and GREM1 (deletion/duplication only). RNA data is routinely analyzed for use in variant interpretation for all genes.      CHIEF COMPLIANT: Breast cancer surveillance  INTERVAL HISTORY: Beverly Mcclain is a 62 year old with above-mentioned history of left breast cancer. She reports that she has been doing well. She denies any side effects. She denies any pain or discomfort in breast. She does stay active.   ALLERGIES:  is allergic to ibuprofen, naproxen sodium, nsaids, valacyclovir, aspirin, and tuberculin tests.  MEDICATIONS:  Current Outpatient Medications  Medication Sig Dispense Refill   acetaminophen (TYLENOL) 500 MG tablet Take 1,000 mg by mouth every 6 (six) hours as needed for moderate pain.      ALPRAZolam (XANAX) 0.5 MG tablet Take 0.25 mg by mouth daily as needed for  anxiety. prn     amphetamine-dextroamphetamine (ADDERALL) 30 MG tablet TAKE 1/2 TABLET BY MOUTH EVERY DAY AT 7 AM, 12 NOON, AND 4 PM 30 DAYS     APPLE  CIDER VINEGAR PO Take 400 mg by mouth daily.     ascorbic acid (VITAMIN C) 1000 MG tablet Take 2,000 mg by mouth daily.     b complex vitamins capsule Take 1 capsule by mouth daily.     diphenhydrAMINE (BENADRYL) 25 MG tablet Take 25 mg by mouth daily as needed for allergies.     HYDROcodone-acetaminophen (NORCO/VICODIN) 5-325 MG tablet Take 0.5 tablets by mouth 2 (two) times daily as needed for moderate pain.     TURMERIC PO Take 1 capsule by mouth daily.     Vitamin D-Vitamin K (K2 PLUS D3 PO) Take by mouth.     No current facility-administered medications for this visit.    PHYSICAL EXAMINATION: ECOG PERFORMANCE STATUS: 1 - Symptomatic but completely ambulatory  Vitals:   09/18/22 0900  BP: 107/74  Pulse: 64  Resp: 18  Temp: 97.9 F (36.6 C)  SpO2: 99%   Filed Weights   09/18/22 0900  Weight: 147 lb (66.7 kg)    BREAST: No palpable masses or nodules in either right or left breasts. No palpable axillary supraclavicular or infraclavicular adenopathy no breast tenderness or nipple discharge. (exam performed in the presence of a chaperone)  LABORATORY DATA:  I have reviewed the data as listed    Latest Ref Rng & Units 09/13/2021    4:06 PM 04/07/2020    3:58 PM 12/24/2019    8:31 AM  CMP  Glucose 70 - 99 mg/dL 604  91  92   BUN 6 - 20 mg/dL 16  18  14    Creatinine 0.44 - 1.00 mg/dL 5.40  9.81  1.91   Sodium 135 - 145 mmol/L 141  140  140   Potassium 3.5 - 5.1 mmol/L 3.9  4.2  4.1   Chloride 98 - 111 mmol/L 105  106  107   CO2 22 - 32 mmol/L 29  27  26    Calcium 8.9 - 10.3 mg/dL 47.8  9.4  29.5   Total Protein 6.5 - 8.1 g/dL 7.3  7.0  7.1   Total Bilirubin 0.3 - 1.2 mg/dL 0.4  0.5  0.3   Alkaline Phos 38 - 126 U/L 67  65  82   AST 15 - 41 U/L 16  20  15    ALT 0 - 44 U/L 11  14  12      Lab Results  Component Value Date   WBC 7.6 09/13/2021   HGB 13.6 09/13/2021   HCT 40.5 09/13/2021   MCV 91.2 09/13/2021   PLT 298 09/13/2021   NEUTROABS 4.9 09/13/2021     ASSESSMENT & PLAN:  Malignant neoplasm of upper-outer quadrant of left breast in female, estrogen receptor positive (HCC) 12/19/2019: Screening mammogram showed a possible mass and 2 asymmetries in the left breast. Diagnostic mammogram showed two masses 1.4cm apart in the left breast at the 2:30 position, 0.7cm and 1.1cm,  Biopsy showed IDC in both masses at the 2:30 position, grade 3, HER-2 equivocal by IHC (2+), negative by FISH, ER+ 80%, PR- 0%, KI67 90%. T1 cN 0 stage Ib clinical stage Oncotype DX: 43, risk of distant recurrence at 9 years: 31%: High risk CT CAP and bone scan: No metastatic disease Breast MRI 01/02/2020: 4.5 cm right breast mass at 12:00, non-mass enhancement 3.7  cm 12:00, 2 previously known malignancies 1.2 cm, 6 mm enhancement, 8 mm NME left breast (recommended biopsies)   Treatment plan: 1. 04/22/2020 Left lumpectomy: Multifocal IDC 1.5 cm, grade 3, margins negative, 0/1 lymph node negative, ER 80% weak, PR 0%, HER-2 negative, Ki-67 90% 2. adjuvant chemotherapy with Taxotere and Cytoxan every 3 weeks x4 cycles (patient refused) 3. Adjuvant radiation therapy (patient refused) 4. Adjuvant antiestrogen therapy (patient refused) -------------------------------------------------------------------------------------------------------------------------- Breast cancer surveillance: 1.  Breast exam 09/18/2022: Benign 2. mammogram 12/16/2021: Benign breast density category C 3.  Breast MRI 05/24/22: Benign breast density category B   Because of her genetic mutation BRI P-1, we recommend MRIs for surveillance.  We will schedule for MRI of this December and after this we will do MRIs every other year.    Return to clinic in 1 year for follow-up    Orders Placed This Encounter  Procedures   MR BREAST BILATERAL W WO CONTRAST INC CAD    Standing Status:   Future    Standing Expiration Date:   09/18/2023    Order Specific Question:   If indicated for the ordered procedure, I  authorize the administration of contrast media per Radiology protocol    Answer:   Yes    Order Specific Question:   What is the patient's sedation requirement?    Answer:   No Sedation    Order Specific Question:   Does the patient have a pacemaker or implanted devices?    Answer:   No    Order Specific Question:   Preferred imaging location?    Answer:   [facility] (table limit-550lbs)   The patient has a good understanding of the overall plan. she agrees with it. she will call with any problems that may develop before the next visit here. Total time spent: 30 mins including face to face time and time spent for planning, charting and co-ordination of care   Beverly Meek, MD 09/18/22    I Beverly Mcclain am acting as a Neurosurgeon for [employer]  I have reviewed the above documentation for accuracy and completeness, and I agree with the above.

## 2022-11-17 ENCOUNTER — Other Ambulatory Visit: Payer: Self-pay | Admitting: Hematology and Oncology

## 2022-11-17 ENCOUNTER — Telehealth: Payer: Self-pay

## 2022-11-17 DIAGNOSIS — Z853 Personal history of malignant neoplasm of breast: Secondary | ICD-10-CM

## 2022-11-17 NOTE — Telephone Encounter (Signed)
Pt states [facility] to schedule [facility] and told them she has a new concern. She was advised to call us.  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 She is growing concerned of the status of her papillomas.   Offered appt with Lillard Anes, NP for 11/21/22 at 1045. She accepted and was scheduled for this.

## 2022-11-21 ENCOUNTER — Telehealth: Payer: Self-pay

## 2022-11-21 ENCOUNTER — Encounter: Payer: Self-pay | Admitting: Adult Health

## 2022-11-21 ENCOUNTER — Other Ambulatory Visit: Payer: Self-pay

## 2022-11-21 ENCOUNTER — Inpatient Hospital Stay: Payer: 59 | Attending: Hematology and Oncology | Admitting: Adult Health

## 2022-11-21 VITALS — BP 104/69 | HR 64 | Temp 97.9°F | Resp 16 | Wt 146.2 lb

## 2022-11-21 DIAGNOSIS — Z87891 Personal history of nicotine dependence: Secondary | ICD-10-CM | POA: Diagnosis not present

## 2022-11-21 DIAGNOSIS — Z853 Personal history of malignant neoplasm of breast: Secondary | ICD-10-CM | POA: Insufficient documentation

## 2022-11-21 DIAGNOSIS — N649 Disorder of breast, unspecified: Secondary | ICD-10-CM | POA: Insufficient documentation

## 2022-11-21 DIAGNOSIS — Z170 Estrogen receptor positive status [ER+]: Secondary | ICD-10-CM

## 2022-11-21 DIAGNOSIS — C50412 Malignant neoplasm of upper-outer quadrant of left female breast: Secondary | ICD-10-CM | POA: Diagnosis not present

## 2022-11-21 DIAGNOSIS — N63 Unspecified lump in unspecified breast: Secondary | ICD-10-CM | POA: Diagnosis not present

## 2022-11-21 NOTE — Progress Notes (Signed)
[facility] Cancer Follow up:    Beverly Else, MD (Inactive) [phone] [address] [place] [address]   DIAGNOSIS:  Cancer Staging  Malignant neoplasm of upper-outer quadrant of left breast in female, estrogen receptor positive (HCC) Staging form: Breast, AJCC 8th Edition - Clinical stage from 12/24/2019: Stage IB (cT1c, cN0, cM0, G3, ER+, PR-, HER2-) - Signed by Serena Croissant, MD on 12/24/2019 Stage prefix: Initial diagnosis Histologic grading system: 3 grade system - Pathologic stage from 04/22/2020: Stage IA (pT1c, pN0, cM0, G3, ER+, PR-, HER2-) - Signed by Loa Socks, NP on 07/28/2020 Stage prefix: Initial diagnosis Histologic grading system: 3 grade system   SUMMARY OF ONCOLOGIC HISTORY: Oncology History  Malignant neoplasm of upper-outer quadrant of left breast in female, estrogen receptor positive (HCC)  12/19/2019 Initial Diagnosis   Screening mammogram showed a possible mass and 2 asymmetries in the left breast. Diagnostic mammogram showed two masses 1.4cm apart in the left breast at the 2:30 position, 0.7cm and 1.1cm, and two likely cysts, a 0.5cm mass a the 3:00 position, and a 0.9cm mass at the 11:00 position. Biopsy showed IDC in both masses at the 2:30 position, grade 3, HER-2 equivocal by IHC (2+), negative by FISH, ER+ 80%, PR- 0%, KI67 90%.   12/19/2019 Oncotype testing   Oncotype DX score 43: Distant recurrence at 9 years 31%   12/24/2019 Cancer Staging   Staging form: Breast, AJCC 8th Edition - Clinical stage from 12/24/2019: Stage IB (cT1c, cN0, cM0, G3, ER+, PR-, HER2-) - Signed by Serena Croissant, MD on 12/24/2019   04/22/2020 Surgery   Left lumpectomy: Multifocal IDC 1.5 cm, grade 3, margins negative, 0/1 lymph node negative, ER 80% weak, PR 0%, HER-2 negative, Ki-67 90%   04/22/2020 Cancer Staging   Staging form: Breast, AJCC 8th Edition - Pathologic stage from 04/22/2020: Stage IA (pT1c, pN0, cM0, G3, ER+, PR-, HER2-) - Signed by Loa Socks, NP on 07/28/2020 Stage prefix: Initial diagnosis Histologic grading system: 3 grade system   04/26/2020 - 04/26/2020 Anti-estrogen oral therapy   Anastrozole daily recommended, patient declined   05/20/2020 Genetic Testing   Positive genetic testing:  A single, 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 Testing was completed through the CancerNext-Expanded + RNAinsight offered by [facility] laboratories. The report date is 05/20/2020.  The CancerNext-Expanded + RNAinsight gene panel offered by [employer] and includes sequencing and rearrangement analysis for the following 77 genes: AIP, ALK, APC, ATM, AXIN2, BAP1, BARD1, BLM, BMPR1A, BRCA1, BRCA2, BRIP1, CDC73, CDH1, CDK4, CDKN1B, CDKN2A, CHEK2, CTNNA1, DICER1, FANCC, FH, FLCN, GALNT12, KIF1B, LZTR1, MAX, MEN1, MET, MLH1, MSH2, MSH3, MSH6, MUTYH, NBN, NF1, NF2, NTHL1, PALB2, PHOX2B, PMS2, POT1, PRKAR1A, PTCH1, PTEN, RAD51C, RAD51D, RB1, RECQL, RET, SDHA, SDHAF2, SDHB, SDHC, SDHD, SMAD4, SMARCA4, SMARCB1, SMARCE1, STK11, SUFU, TMEM127, TP53, TSC1, TSC2, VHL and XRCC2 (sequencing and deletion/duplication); EGFR, EGLN1, HOXB13, KIT, MITF, PDGFRA, POLD1 and POLE (sequencing only); EPCAM and GREM1 (deletion/duplication only). RNA data is routinely analyzed for use in variant interpretation for all genes.      CURRENT THERAPY: Observation  INTERVAL HISTORY: Beverly Mcclain 62 y.o. female returns because she called to schedule her bilateral breast mammogram at the breast center and had noted changes in her breast therefore they wanted her to see [place] prior to scheduling her mammogram.  We reviewed that she does have a history of left breast cancer that was diagnosed in December 2021 that she underwent a lumpectomy  for as well as high risk breast lesions in her right breast.  She undergoes an annual mammogram every July and a breast MRI every December.   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  MEDICAL HISTORY: 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     SURGICAL HISTORY: Past Surgical History:  Procedure Laterality Date   BREAST BIOPSY Left 12/15/2019   x2   BREAST BIOPSY Left 01/20/2020   x2   BREAST BIOPSY Right 01/30/2020   x2   BREAST BIOPSY Left 02/13/2020   BREAST LUMPECTOMY Left 04/22/2020   BREAST LUMPECTOMY WITH RADIOACTIVE SEED LOCALIZATION Left 04/22/2020   Procedure: LEFT BREAST LUMPECTOMY WITH RADIOACTIVE SEED LOCALIZATION TIMES TWO;  Surgeon: Ovidio Kin, MD;  Location: [facility] OR;  Service: General;  Laterality: Left;  LEFT PEC BLOCK   CESAREAN SECTION     COLONOSCOPY     FRACTURE SURGERY     left hand 5th finger   LITHOTRIPSY  2000   polyp removal     TOTAL HIP ARTHROPLASTY  12/2020   TUBAL LIGATION      SOCIAL HISTORY: Social History   Socioeconomic History   Marital status: Married    Spouse name: Not on file   Number of children: Not on file   Years of education: Not on file   Highest education level: Not on file  Occupational History   Occupation: cleaner  Tobacco Use   Smoking status: Former    Types: Cigarettes    Quit date: 12/23/1988    Years since quitting: 33.9   Smokeless tobacco: Never  Vaping Use   Vaping Use:  Never used  Substance and Sexual Activity   Alcohol use: Yes    Comment: one a day   Drug use: Yes    Types: Marijuana    Comment: couple times a week   Sexual activity: Yes    Birth control/protection: Post-menopausal  Other Topics Concern   Not on file  Social History Narrative   Not on file   Social Determinants of Health   Financial Resource Strain: Medium Risk (12/24/2019)   Overall Financial Resource Strain (CARDIA)    Difficulty of Paying Living Expenses: Somewhat hard  Food Insecurity: No Food Insecurity (12/24/2019)   Hunger Vital Sign    Worried About Running Out of Food in the Last Year: Never true    Ran Out of Food in the Last Year: Never true  Transportation Needs: No Transportation Needs (12/24/2019)   PRAPARE - Administrator, Civil Service (Medical): No    Lack of Transportation (Non-Medical): No  Physical Activity: Not on  file  Stress: Not on file  Social Connections: Not on file  Intimate Partner Violence: Not on file    FAMILY HISTORY: Family History  Problem Relation Age of Onset   Heart attack Paternal Grandmother    Colon cancer Paternal Grandfather        dx unknown age   Leukemia Paternal Aunt 39   Cancer Paternal Uncle 77       unknown cancer type    Review of Systems  Constitutional:  Negative for appetite change, chills, fatigue, fever and unexpected weight change.  HENT:   Negative for hearing loss, lump/mass and trouble swallowing.   Eyes:  Negative for eye problems and icterus.  Respiratory:  Negative for chest tightness, cough and shortness of breath.   Cardiovascular:  Negative for chest pain, leg swelling and palpitations.  Gastrointestinal:  Negative for abdominal distention, abdominal pain, constipation, diarrhea, nausea and vomiting.  Endocrine: Negative for hot flashes.  Genitourinary:  Negative for difficulty urinating.   Musculoskeletal:  Negative for arthralgias.  Skin:  Negative for itching and rash.  Neurological:   Negative for dizziness, extremity weakness, headaches and numbness.  Hematological:  Negative for adenopathy. Does not bruise/bleed easily.  Psychiatric/Behavioral:  Negative for depression. The patient is not nervous/anxious.       PHYSICAL EXAMINATION   Onc Performance Status - 11/21/22 1000       KPS SCALE   KPS % SCORE Able to carry on normal activity, minor s/s of disease             Vitals:   11/21/22 1048  BP: 104/69  Pulse: 64  Resp: 16  Temp: 97.9 F (36.6 C)  SpO2: 99%    Physical Exam Constitutional:      General: She is not in acute distress.    Appearance: Normal appearance. She is not toxic-appearing.  HENT:     Head: Normocephalic and atraumatic.     Mouth/Throat:     Mouth: Mucous membranes are moist.     Pharynx: Oropharynx is clear. No oropharyngeal exudate or posterior oropharyngeal erythema.  Eyes:     General: No scleral icterus. Cardiovascular:     Rate and Rhythm: Normal rate and regular rhythm.     Pulses: Normal pulses.     Heart sounds: Normal heart sounds.  Pulmonary:     Effort: Pulmonary effort is normal.     Breath sounds: Normal breath sounds.  Chest:     Comments: Right breast with mobile soft nodule in right lower outer breast about 1cmfn left breast with mobile soft nodule in left upper outer breast.   Abdominal:     General: Abdomen is flat. Bowel sounds are normal. There is no distension.     Palpations: Abdomen is soft.     Tenderness: There is no abdominal tenderness.  Musculoskeletal:        General: No swelling.     Cervical back: Neck supple.  Lymphadenopathy:     Cervical: No cervical adenopathy.  Skin:    General: Skin is warm and dry.     Findings: No rash.  Neurological:     General: No focal deficit present.     Mental Status: She is alert.  Psychiatric:        Mood and Affect: Mood normal.        Behavior: Behavior normal.         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  She also has history of high risk breast lesions as well.  Due to her breast changes that she is noticed I placed orders for bilateral diagnostic mammogram and bilateral breast ultrasounds to further evaluate these changes.  I do not feel any overtly concerning breast lesion however she does have a couple areas where there is a noticeable skin change and it is prudent to get to the bottom of this considering her history.  She will proceed with mammogram and ultrasounds when scheduled.  She is in agreement with this.  She is also going to undergo breast MRI in December.  Her follow-up with Dr. Pamelia Hoit is in May 2025.  She told me that she is nervous about having oophorectomy due to the time she has to be out of work.  I let her know that at her age her ovaries are not really functioning and producing estrogen at this point and it would be a good time to remove them.  She is considering this since her husband recently had surgery.  RTC in 09/2023 for f/u with Dr. Pamelia Hoit.  She will call if she has any other concerns prior to her next appt with [place].      All questions were answered. The patient knows to call the clinic with any problems, questions or concerns. We can certainly see the patient much sooner if necessary.  Total encounter time:20 minutes*in face-to-face visit time, chart review, lab review, care coordination, order entry, and documentation of the encounter time.    Lillard Anes, NP 11/21/22 11:34 AM Medical Oncology and Hematology [facility] [address] [place], [place] [address] Tel. [phone]    Fax. [phone]  *Total Encounter Time as defined by the Centers for Medicare and Medicaid Services includes, in  addition to the face-to-face time of a patient visit (documented in the note above) non-face-to-face time: obtaining and reviewing outside history, ordering and reviewing medications, tests or procedures, care coordination (communications with other health care professionals or caregivers) and documentation in the medical record.

## 2022-11-21 NOTE — Telephone Encounter (Signed)
Open in error

## 2022-11-21 NOTE — Assessment & Plan Note (Signed)
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  She also has history of high risk breast lesions as well.  Due to her breast changes that she is noticed I placed orders for bilateral diagnostic mammogram and bilateral breast ultrasounds to further evaluate these changes.  I do not feel any overtly concerning breast lesion however she does have a couple areas where there is a noticeable skin change and it is prudent to get to the bottom of this considering her history.  She will proceed with mammogram and ultrasounds when scheduled.  She is in agreement with this.  She is also going to undergo breast MRI in December.  Her follow-up with Dr. Pamelia Hoit is in May 2025.  She told me that she is nervous about having oophorectomy due to the time she has to be out of work.  I let her know that at her age her ovaries are not really functioning and producing estrogen at this point and it would be a good time to remove them.  She is considering this since her husband recently had surgery.  RTC in 09/2023 for f/u with Dr. Pamelia Hoit.  She will call if she has any other concerns prior to her next appt with [place].

## 2022-12-01 ENCOUNTER — Ambulatory Visit
Admission: RE | Admit: 2022-12-01 | Discharge: 2022-12-01 | Disposition: A | Discharge status: Home / Self Care | Payer: 59 | Source: Ambulatory Visit | Attending: Adult Health | Admitting: Adult Health

## 2022-12-01 ENCOUNTER — Ambulatory Visit: Payer: 59

## 2022-12-01 DIAGNOSIS — N63 Unspecified lump in unspecified breast: Secondary | ICD-10-CM

## 2022-12-01 DIAGNOSIS — Z170 Estrogen receptor positive status [ER+]: Secondary | ICD-10-CM

## 2023-05-10 ENCOUNTER — Other Ambulatory Visit: Payer: 59

## 2023-06-26 ENCOUNTER — Ambulatory Visit (HOSPITAL_BASED_OUTPATIENT_CLINIC_OR_DEPARTMENT_OTHER): Payer: 59 | Admitting: Cardiology

## 2023-09-11 ENCOUNTER — Telehealth: Payer: Self-pay | Admitting: *Deleted

## 2023-09-11 NOTE — Telephone Encounter (Signed)
 Received call from pt requesting to cancel upcoming MD appt.  Pt states she does not wish to f/u at this time and will request PCP to order mammograms.  Appt canceled and pt notified to reach out if she would like to f/u in the future.

## 2023-09-24 ENCOUNTER — Ambulatory Visit: Payer: 59 | Admitting: Hematology and Oncology

## 2024-05-18 ENCOUNTER — Emergency Department (HOSPITAL_BASED_OUTPATIENT_CLINIC_OR_DEPARTMENT_OTHER)

## 2024-05-18 ENCOUNTER — Other Ambulatory Visit: Payer: Self-pay

## 2024-05-18 ENCOUNTER — Inpatient Hospital Stay (HOSPITAL_BASED_OUTPATIENT_CLINIC_OR_DEPARTMENT_OTHER)
Admission: EM | Admit: 2024-05-18 | Discharge: 2024-05-21 | DRG: 378 | Disposition: A | Discharge status: Home / Self Care | Attending: Internal Medicine | Admitting: Internal Medicine

## 2024-05-18 ENCOUNTER — Encounter (HOSPITAL_BASED_OUTPATIENT_CLINIC_OR_DEPARTMENT_OTHER): Payer: Self-pay | Admitting: Emergency Medicine

## 2024-05-18 DIAGNOSIS — F419 Anxiety disorder, unspecified: Secondary | ICD-10-CM | POA: Diagnosis present

## 2024-05-18 DIAGNOSIS — K589 Irritable bowel syndrome without diarrhea: Secondary | ICD-10-CM | POA: Diagnosis present

## 2024-05-18 DIAGNOSIS — M1990 Unspecified osteoarthritis, unspecified site: Secondary | ICD-10-CM | POA: Diagnosis present

## 2024-05-18 DIAGNOSIS — R1084 Generalized abdominal pain: Secondary | ICD-10-CM | POA: Diagnosis present

## 2024-05-18 DIAGNOSIS — Z96649 Presence of unspecified artificial hip joint: Secondary | ICD-10-CM | POA: Diagnosis present

## 2024-05-18 DIAGNOSIS — E871 Hypo-osmolality and hyponatremia: Secondary | ICD-10-CM | POA: Diagnosis present

## 2024-05-18 DIAGNOSIS — Z800 Family history of malignant neoplasm of digestive organs: Secondary | ICD-10-CM

## 2024-05-18 DIAGNOSIS — M545 Low back pain, unspecified: Secondary | ICD-10-CM | POA: Diagnosis present

## 2024-05-18 DIAGNOSIS — Z853 Personal history of malignant neoplasm of breast: Secondary | ICD-10-CM

## 2024-05-18 DIAGNOSIS — Z886 Allergy status to analgesic agent status: Secondary | ICD-10-CM

## 2024-05-18 DIAGNOSIS — K5721 Diverticulitis of large intestine with perforation and abscess with bleeding: Principal | ICD-10-CM | POA: Diagnosis present

## 2024-05-18 DIAGNOSIS — K8500 Idiopathic acute pancreatitis without necrosis or infection: Principal | ICD-10-CM

## 2024-05-18 DIAGNOSIS — G894 Chronic pain syndrome: Secondary | ICD-10-CM | POA: Diagnosis present

## 2024-05-18 DIAGNOSIS — R748 Abnormal levels of other serum enzymes: Secondary | ICD-10-CM | POA: Diagnosis present

## 2024-05-18 DIAGNOSIS — Z87442 Personal history of urinary calculi: Secondary | ICD-10-CM | POA: Diagnosis not present

## 2024-05-18 DIAGNOSIS — Z79899 Other long term (current) drug therapy: Secondary | ICD-10-CM | POA: Diagnosis not present

## 2024-05-18 DIAGNOSIS — F909 Attention-deficit hyperactivity disorder, unspecified type: Secondary | ICD-10-CM | POA: Diagnosis present

## 2024-05-18 DIAGNOSIS — Z87891 Personal history of nicotine dependence: Secondary | ICD-10-CM

## 2024-05-18 DIAGNOSIS — K5732 Diverticulitis of large intestine without perforation or abscess without bleeding: Secondary | ICD-10-CM | POA: Diagnosis not present

## 2024-05-18 DIAGNOSIS — Z860100 Personal history of colon polyps, unspecified: Secondary | ICD-10-CM | POA: Diagnosis not present

## 2024-05-18 DIAGNOSIS — Z806 Family history of leukemia: Secondary | ICD-10-CM | POA: Diagnosis not present

## 2024-05-18 DIAGNOSIS — Z8249 Family history of ischemic heart disease and other diseases of the circulatory system: Secondary | ICD-10-CM | POA: Diagnosis not present

## 2024-05-18 LAB — CBC
HCT: 36.4 % (ref 36.0–46.0)
Hemoglobin: 12.4 g/dL (ref 12.0–15.0)
MCH: 30.2 pg (ref 26.0–34.0)
MCHC: 34.1 g/dL (ref 30.0–36.0)
MCV: 88.6 fL (ref 80.0–100.0)
Platelets: 324 K/uL (ref 150–400)
RBC: 4.11 MIL/uL (ref 3.87–5.11)
RDW: 12.2 % (ref 11.5–15.5)
WBC: 10.6 K/uL — ABNORMAL HIGH (ref 4.0–10.5)
nRBC: 0 % (ref 0.0–0.2)

## 2024-05-18 LAB — COMPREHENSIVE METABOLIC PANEL WITH GFR
ALT: 19 U/L (ref 0–44)
AST: 15 U/L (ref 15–41)
Albumin: 4.4 g/dL (ref 3.5–5.0)
Alkaline Phosphatase: 101 U/L (ref 38–126)
Anion gap: 11 (ref 5–15)
BUN: 8 mg/dL (ref 8–23)
CO2: 24 mmol/L (ref 22–32)
Calcium: 10.2 mg/dL (ref 8.9–10.3)
Chloride: 99 mmol/L (ref 98–111)
Creatinine, Ser: 0.64 mg/dL (ref 0.44–1.00)
GFR, Estimated: >60 mL/min
Glucose, Bld: 115 mg/dL — ABNORMAL HIGH (ref 70–99)
Potassium: 4 mmol/L (ref 3.5–5.1)
Sodium: 134 mmol/L — ABNORMAL LOW (ref 135–145)
Total Bilirubin: 0.7 mg/dL (ref 0.0–1.2)
Total Protein: 7.6 g/dL (ref 6.5–8.1)

## 2024-05-18 LAB — LIPASE, BLOOD: Lipase: 160 U/L — ABNORMAL HIGH (ref 11–51)

## 2024-05-18 LAB — URINALYSIS, ROUTINE W REFLEX MICROSCOPIC
Bilirubin Urine: NEGATIVE
Glucose, UA: NEGATIVE mg/dL
Hgb urine dipstick: NEGATIVE
Ketones, ur: NEGATIVE mg/dL
Leukocytes,Ua: NEGATIVE
Nitrite: NEGATIVE
Protein, ur: NEGATIVE mg/dL
Specific Gravity, Urine: 1.008 (ref 1.005–1.030)
pH: 5.5 (ref 5.0–8.0)

## 2024-05-18 MED ORDER — ACETAMINOPHEN 325 MG PO TABS: 650.0000 mg | ORAL_TABLET | Freq: Four times a day (QID) | ORAL | Status: DC | PRN

## 2024-05-18 MED ORDER — ONDANSETRON HCL 4 MG/2ML IJ SOLN
4.0000 mg | Freq: Once | INTRAMUSCULAR | Status: AC
  Administered 2024-05-18: 4 mg via INTRAVENOUS
  Filled 2024-05-18: qty 2

## 2024-05-18 MED ORDER — IOHEXOL 300 MG/ML  SOLN
100.0000 mL | Freq: Once | INTRAMUSCULAR | Status: AC | PRN
  Administered 2024-05-18: 100 mL via INTRAVENOUS

## 2024-05-18 MED ORDER — SENNOSIDES-DOCUSATE SODIUM 8.6-50 MG PO TABS: 1.0000 | ORAL_TABLET | Freq: Every evening | ORAL | Status: DC | PRN

## 2024-05-18 MED ORDER — LACTATED RINGERS IV SOLN: INTRAVENOUS | Status: AC

## 2024-05-18 MED ORDER — ENOXAPARIN SODIUM 40 MG/0.4ML IJ SOSY: 40.0000 mg | PREFILLED_SYRINGE | INTRAMUSCULAR | Status: DC

## 2024-05-18 MED ORDER — PIPERACILLIN-TAZOBACTAM 3.375 G IVPB 30 MIN
3.3750 g | Freq: Once | INTRAVENOUS | Status: AC
  Administered 2024-05-18: 3.375 g via INTRAVENOUS
  Filled 2024-05-18: qty 50

## 2024-05-18 MED ORDER — HYDROMORPHONE HCL 1 MG/ML IJ SOLN
0.5000 mg | Freq: Four times a day (QID) | INTRAMUSCULAR | Status: DC | PRN
  Administered 2024-05-19 – 2024-05-20 (×5): 0.5 mg via INTRAVENOUS

## 2024-05-18 MED ORDER — LACTATED RINGERS IV BOLUS
1000.0000 mL | Freq: Once | INTRAVENOUS | Status: AC
  Administered 2024-05-18: 1000 mL via INTRAVENOUS

## 2024-05-18 MED ORDER — OXYCODONE-ACETAMINOPHEN 5-325 MG PO TABS
1.0000 | ORAL_TABLET | Freq: Four times a day (QID) | ORAL | Status: DC | PRN
  Administered 2024-05-19 (×2): 1 via ORAL
  Filled 2024-05-18: qty 1

## 2024-05-18 MED ORDER — ONDANSETRON HCL 4 MG PO TABS
4.0000 mg | ORAL_TABLET | Freq: Four times a day (QID) | ORAL | Status: DC | PRN
  Administered 2024-05-19: 4 mg via ORAL

## 2024-05-18 MED ORDER — FENTANYL CITRATE (PF) 50 MCG/ML IJ SOSY
50.0000 ug | PREFILLED_SYRINGE | Freq: Once | INTRAMUSCULAR | Status: AC
  Administered 2024-05-18: 50 ug via INTRAVENOUS
  Filled 2024-05-18: qty 1

## 2024-05-18 MED ORDER — PIPERACILLIN-TAZOBACTAM 3.375 G IVPB
3.3750 g | Freq: Three times a day (TID) | INTRAVENOUS | Status: DC
  Administered 2024-05-19 – 2024-05-20 (×6): 3.375 g via INTRAVENOUS

## 2024-05-18 MED ORDER — ORAL CARE MOUTH RINSE: 15.0000 mL | OROMUCOSAL | Status: DC | PRN

## 2024-05-18 MED ORDER — ONDANSETRON HCL 4 MG/2ML IJ SOLN
4.0000 mg | Freq: Four times a day (QID) | INTRAMUSCULAR | Status: DC | PRN
  Administered 2024-05-19: 4 mg via INTRAVENOUS

## 2024-05-18 MED ORDER — BISACODYL 5 MG PO TBEC: 5.0000 mg | DELAYED_RELEASE_TABLET | Freq: Every day | ORAL | Status: DC | PRN

## 2024-05-18 MED ORDER — ACETAMINOPHEN 650 MG RE SUPP: 650.0000 mg | Freq: Four times a day (QID) | RECTAL | Status: DC | PRN

## 2024-05-18 NOTE — ED Triage Notes (Signed)
 Abdo pain Left side Worse with walking Started Tuesday Not eating much Denies n/v/d

## 2024-05-18 NOTE — ED Provider Notes (Signed)
 " [place] [facility] AT [facility] Provider Note   CSN: [id number] Arrival date & time: 05/18/24  1219     Patient presents with: Abdominal Pain   Beverly Mcclain is a 63 y.o. female with history of breast cancer, constipation, IBS, presents with concern for generalized abdominal pain that started 6 days ago.  Reports pain is fairly constant.  No associated nausea, vomiting, or diarrhea.  She is still having normal bowel movements with the last bowel movement being this morning.  No fever or chills at home.  Reports that the pain is mostly in the upper abdomen and does wrap around to the back.  Denies any dysuria or, hematuria, increased frequency.  No chest pain or shortness of breath.    Abdominal Pain      Prior to Admission medications  Medication Sig Start Date End Date Taking? Authorizing Provider  acetaminophen  (TYLENOL ) 500 MG tablet Take 1,000 mg by mouth every 6 (six) hours as needed for moderate pain.     [provider]  ALPRAZolam (XANAX) 0.5 MG tablet Take 0.25 mg by mouth daily as needed for anxiety. prn    [provider]  amphetamine-dextroamphetamine (ADDERALL) 30 MG tablet TAKE 1/2 TABLET BY MOUTH EVERY DAY AT 7 AM, 12 NOON, AND 4 PM 30 DAYS 07/16/20   [provider]  APPLE CIDER VINEGAR PO Take 400 mg by mouth daily.    [provider]  ascorbic acid (VITAMIN C) 1000 MG tablet Take 2,000 mg by mouth daily.    [provider]  b complex vitamins capsule Take 1 capsule by mouth daily.    [provider]  diphenhydrAMINE (BENADRYL) 25 MG tablet Take 25 mg by mouth daily as needed for allergies.    [provider]  HYDROcodone -acetaminophen  (NORCO/VICODIN) 5-325 MG tablet Take 0.5 tablets by mouth 2 (two) times daily as needed for moderate pain.    [provider]  Magnesium Citrate 125 MG CAPS Take by mouth daily.    [provider]  TURMERIC PO Take 1 capsule by  mouth daily.    [provider]  Vitamin D-Vitamin K (K2 PLUS D3 PO) Take by mouth.    [provider]    Allergies: Ibuprofen, Naproxen sodium, Nsaids, Valacyclovir , Aspirin, and Tuberculin tests    Review of Systems  Gastrointestinal:  Positive for abdominal pain.    Updated Vital Signs BP 111/67   Pulse 76   Temp 98.4 F (36.9 C)   Resp 15   SpO2 99%   Physical Exam Vitals and nursing note reviewed.  Constitutional:      General: She is not in acute distress.    Appearance: She is well-developed.  HENT:     Head: Normocephalic and atraumatic.  Eyes:     Conjunctiva/sclera: Conjunctivae normal.  Cardiovascular:     Rate and Rhythm: Normal rate and regular rhythm.     Heart sounds: No murmur heard. Pulmonary:     Effort: Pulmonary effort is normal. No respiratory distress.     Breath sounds: Normal breath sounds.  Abdominal:     Palpations: Abdomen is soft.     Tenderness: There is no abdominal tenderness.     Comments: No significant abdominal tenderness noted on exam, but patient reports that she feels tender when abdomen is palpated on.  Musculoskeletal:        General: No swelling.     Cervical back: Neck supple.  Skin:    General:  Skin is warm and dry.     Capillary Refill: Capillary refill takes less than 2 seconds.  Neurological:     Mental Status: She is alert.  Psychiatric:        Mood and Affect: Mood normal.     (all labs ordered are listed, but only abnormal results are displayed) Labs Reviewed  LIPASE, BLOOD - Abnormal; Notable for the following components:      Result Value   Lipase 160 (*)    All other components within normal limits  COMPREHENSIVE METABOLIC PANEL WITH GFR - Abnormal; Notable for the following components:   Sodium 134 (*)    Glucose, Bld 115 (*)    All other components within normal limits  CBC - Abnormal; Notable for the following components:   WBC 10.6 (*)    All other components within normal limits   URINALYSIS, ROUTINE W REFLEX MICROSCOPIC    EKG: None  Radiology: No results found.   Procedures   Medications Ordered in the ED  lactated ringers  bolus 1,000 mL (1,000 mLs Intravenous New Bag/Given 05/18/24 1759)  fentaNYL  (SUBLIMAZE ) injection 50 mcg (50 mcg Intravenous Given 05/18/24 1824)  iohexol  (OMNIPAQUE ) 300 MG/ML solution 100 mL (100 mLs Intravenous Contrast Given 05/18/24 1804)  ondansetron  (ZOFRAN ) injection 4 mg (4 mg Intravenous Given 05/18/24 1824)                                    Medical Decision Making Amount and/or Complexity of Data Reviewed Labs: ordered. Radiology: ordered.  Risk Prescription drug management.     Differential diagnosis includes but is not limited to acute cholecystitis, cholelithiasis, cholangitis, choledocholithiasis, peptic ulcer, gastritis, gastroenteritis, appendicitis, IBS, IBD, DKA, nephrolithiasis, UTI, pyelonephritis, pancreatitis, diverticulitis, mesenteric ischemia, abdominal aortic aneurysm, small bowel obstruction, volvulus   ED Course:  Upon initial evaluation, patient is well-appearing, no acute distress.  Normal vital signs.  Abdomen is soft and nontender on my initial exam.  No active vomiting.  Labs Ordered: I Ordered, and personally interpreted labs.  The pertinent results include:   CBC with leukocytosis of 10.6 CMP with slight hyponatremia 134.  Otherwise within normal limits Lipase elevated at 160 Urinalysis without signs of infection  Imaging Studies ordered: I ordered imaging studies including CT abdomen and pelvis, awaiting results   Medications Given: LR bolus Fentanyl   Upon re-evaluation, patient reports some improvement in pain with the medications given.  She has an elevated lipase at 160, this is concerning for pancreatitis especially with the upper abdominal pain she is reporting and pain that radiates to the back.  She does report drinking some moonshine over Christmas, but this was after  her pain had originally started.  Will obtain CT abdomen pelvis to rule out any complications from potential pancreatitis as well as to rule out other acute intra-abdominal pathology such as diverticulitis, appendicitis, given her diffuse pain.    Impression: Abdominal pain  Disposition:  Care of this patient signed out to oncoming ED provider Darice Showers, PA-C to follow up on CT scan. Disposition and treatment plan pending imaging results and clinical judgment of oncoming ED team.     This chart was dictated using voice recognition software, Dragon. Despite the best efforts of this provider to proofread and correct errors, errors may still occur which can change documentation meaning.       Final diagnoses:  Idiopathic acute pancreatitis, unspecified complication status  Generalized abdominal  pain    ED Discharge Orders     None          Veta Palma, [place] 05/18/24 1913    Bari Roxie HERO, DO 05/18/24 2035  "

## 2024-05-18 NOTE — H&P (Signed)
 " History and Physical  Beverly Mcclain FMW:[id number] DOB: 02/24/1961 DOA: 05/18/2024  PCP: Gib Charleston, MD   Chief Complaint: Abdominal pain  HPI: Beverly Mcclain is a 63 y.o. female with medical history significant for anxiety, arthritis, DDD, breast cancer s/p left breast lumpectomy, IBS, constipation, diverticulosis and nephrolithiasis who presents today [facility] for evaluation of abdominal pain.  Patient reports that on Tuesday, she started having abdominal pain worse in her left lower quadrant.  The pain has been constant and has progressed over the last few days with decreased p.o. intake due to concern about making the pain worse.  She reports associated left flank pain and a low-grade fever but denies any nausea, vomiting, diarrhea, hematuria, dysuria, chest pain or shortness of breath.  [facility] ED Course: She is afebrile and hemodynamically stable. CT A/P shows Acute sigmoid diverticulitis with microperforation. No abscess. Dr. Signe of general surgery was consulted and the patient was given IVF, Zosyn , fentanyl , and Zofran .  Patient admitted to [facility] service and transferred to [facility].  Review of Systems: Please see HPI for pertinent positives and negatives. A complete 10 system review of systems are otherwise negative.  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    Past Surgical History:  Procedure Laterality Date   BREAST BIOPSY Left 12/15/2019   x2   BREAST BIOPSY Left 01/20/2020   x2   BREAST BIOPSY Right 01/30/2020   x2   BREAST BIOPSY Left 02/13/2020   BREAST LUMPECTOMY Left 04/22/2020   BREAST LUMPECTOMY WITH RADIOACTIVE SEED LOCALIZATION Left  04/22/2020   Procedure: LEFT BREAST LUMPECTOMY WITH RADIOACTIVE SEED LOCALIZATION TIMES TWO;  Surgeon: Ethyl Lenis, MD;  Location: [facility] OR;  Service: General;  Laterality: Left;  LEFT PEC BLOCK   CESAREAN SECTION     COLONOSCOPY     FRACTURE SURGERY     left hand 5th finger   LITHOTRIPSY  2000   polyp removal     TOTAL HIP ARTHROPLASTY  12/2020   TUBAL LIGATION     Social History:  reports that she quit smoking about 35 years ago. Her smoking use included cigarettes. She has never used smokeless tobacco. She reports current alcohol use. She reports current drug use. Drug: Marijuana.  Allergies[1]  Family History  Problem Relation Age of Onset   Heart attack Paternal Grandmother    Colon cancer Paternal Grandfather        dx unknown age   Leukemia Paternal Aunt 15   Cancer Paternal Uncle 66       unknown cancer type     Prior to Admission medications  Medication Sig Start Date End Date Taking? Authorizing Provider  acetaminophen  (TYLENOL ) 500 MG tablet Take 1,000 mg by mouth every 6 (six) hours as needed for moderate pain.     [provider]  ALPRAZolam (XANAX) 0.5 MG tablet Take 0.25 mg by mouth daily as needed for anxiety. prn    [provider]  amphetamine-dextroamphetamine (ADDERALL) 30 MG tablet TAKE 1/2 TABLET BY MOUTH EVERY  DAY AT 7 AM, 12 NOON, AND 4 PM 30 DAYS 07/16/20   [provider]  APPLE CIDER VINEGAR PO Take 400 mg by mouth daily.    [provider]  ascorbic acid (VITAMIN C) 1000 MG tablet Take 2,000 mg by mouth daily.    [provider]  b complex vitamins capsule Take 1 capsule by mouth daily.    [provider]  diphenhydrAMINE (BENADRYL) 25 MG tablet Take 25 mg by mouth daily as needed for allergies.    [provider]  HYDROcodone -acetaminophen  (NORCO/VICODIN) 5-325 MG tablet Take 0.5 tablets by mouth 2 (two) times daily as needed for moderate pain.    [provider]  Magnesium Citrate  125 MG CAPS Take by mouth daily.    [provider]  TURMERIC PO Take 1 capsule by mouth daily.    [provider]  Vitamin D-Vitamin K (K2 PLUS D3 PO) Take by mouth.    [provider]    Physical Exam: BP 114/64 (BP Location: Left Arm)   Pulse 69   Temp 98.2 F (36.8 C) (Oral)   Resp 19   Ht 5' 2 (1.575 m)   Wt 65.8 kg   SpO2 98%   BMI 26.52 kg/m  General: Pleasant, well-appearing woman laying in bed. No acute distress. HEENT: [place]/AT. Anicteric sclera CV: RRR. No murmurs, rubs, or gallops. No LE edema Pulmonary: Lungs CTAB. Normal effort. No wheezing or rales. Abdominal: Soft, nondistended. Mild generalized tenderness to palpation worse in the LLQ.  Mild left flank tenderness. Normal bowel sounds. Extremities: Palpable radial and DP pulses. Normal ROM. Skin: Warm and dry. No obvious rash or lesions. Neuro: A&Ox3. Moves all extremities. Normal sensation to light touch. No focal deficit. Psych: Normal mood and affect          Labs on Admission:  Basic Metabolic Panel: Recent Labs  Lab 05/18/24 1250  NA 134*  K 4.0  CL 99  CO2 24  GLUCOSE 115*  BUN 8  CREATININE 0.64  CALCIUM 10.2   Liver Function Tests: Recent Labs  Lab 05/18/24 1250  AST 15  ALT 19  ALKPHOS 101  BILITOT 0.7  PROT 7.6  ALBUMIN 4.4   Recent Labs  Lab 05/18/24 1250  LIPASE 160*   No results for input(s): AMMONIA in the last 168 hours. CBC: Recent Labs  Lab 05/18/24 1250  WBC 10.6*  HGB 12.4  HCT 36.4  MCV 88.6  PLT 324   Cardiac Enzymes: No results for input(s): CKTOTAL, CKMB, CKMBINDEX, TROPONINI in the last 168 hours. BNP (last 3 results) No results for input(s): BNP in the last 8760 hours.  ProBNP (last 3 results) No results for input(s): PROBNP in the last 8760 hours.  CBG: No results for input(s): GLUCAP in the last 168 hours.  Radiological Exams on Admission: CT ABDOMEN PELVIS W CONTRAST Result Date: 05/18/2024 EXAM: CT  ABDOMEN AND PELVIS WITH CONTRAST 05/18/2024 06:13:28 PM TECHNIQUE: CT of the abdomen and pelvis was performed with the administration of 100 mL of iohexol  (OMNIPAQUE ) 300 MG/ML solution. Multiplanar reformatted images are provided for review. Automated exposure control, iterative reconstruction, and/or weight-based adjustment of the mA/kV was utilized to reduce the radiation dose to as low as reasonably achievable. COMPARISON: CT abdomen and pelvis 04/26/2021. CLINICAL HISTORY: Pancreatitis, acute, severe. FINDINGS: LOWER CHEST: No acute abnormality. LIVER: The liver is unremarkable. GALLBLADDER AND BILE DUCTS: Gallbladder is unremarkable. No biliary ductal dilatation. SPLEEN: No acute abnormality. PANCREAS: No acute abnormality.  ADRENAL GLANDS: No acute abnormality. KIDNEYS, URETERS AND BLADDER: Subcentimeter rounded hypodense areas are seen in the left kidney, unchanged, likely angiomyolipomas. Fat containing lesion in the inferior pole of the right kidney measuring 12 mm is also unchanged, likely angiomyolipoma. No stones in the kidneys or ureters. No hydronephrosis. No perinephric or periureteral stranding. Limited evaluation the bladder is within normal limits. GI AND BOWEL: Stomach demonstrates no acute abnormality. There is sigmoid colon diverticulosis. There is wall thickening and inflammation of the sigmoid colon compatible with acute diverticulitis. Microperforation is present. No evidence for abscess. The appendix is likely surgically absent. There is no bowel obstruction. PERITONEUM AND RETROPERITONEUM: No ascites. No free air. VASCULATURE: Aorta is normal in caliber. LYMPH NODES: No lymphadenopathy. REPRODUCTIVE ORGANS: No acute abnormality. BONES AND SOFT TISSUES: Right hip arthroplasty present. No acute osseous abnormality. No focal soft tissue abnormality. IMPRESSION: 1. Acute sigmoid diverticulitis with microperforation. No abscess. 2. Bilateral renal angiomyolipomas, unchanged. No follow-up imaging  is recommended. Electronically signed by: Greig Pique MD 05/18/2024 07:12 PM EST RP Workstation: [id number]   Assessment/Plan Beverly Mcclain is a 63 y.o. female with medical history significant for anxiety, arthritis, DDD, breast cancer s/p left breast lumpectomy, IBS, constipation, diverticulosis and nephrolithiasis who presents today [facility] for evaluation of abdominal pain and admitted for acute sigmoid diverticulitis  # Acute sigmoid diverticulitis - Patient with history of constipation and diverticulosis presented with 3 to 4 days of worsening abdominal pain with associated low-grade fever and anorexia - CT A/P shows acute  sigmoid diverticulitis with microperforation but no abscess - General Surgery following, will see in a.m. - Admit for pain control and IV antibiotics - Continue Zosyn  and start IV LR 125 cc/h infusion - Pain control with as needed Percocet and IV Dilaudid  - Keep NPO and advance diet as tolerated in the morning - Trend CBC, fever curve  # Elevated lipase - Lipase elevated to 160 on admission - No acute pancreatic abnormality on CT, likely elevated due to acute diverticulitis - CTM  # Anxiety - Reports she is currently not on any medications  # Arthritis # DDD - As needed Tylenol  for pain  # Hx of breast cancer - Status post left breast lumpectomy with radioactive seed in 2021   DVT prophylaxis: Lovenox      Code Status: Full Code  Consults called: General Surgery  Family Communication: No family at bedside  Severity of Illness: The appropriate patient status for this patient is INPATIENT. Inpatient status is judged to be reasonable and necessary in order to provide the required intensity of service to ensure the patient's safety. The patient's presenting symptoms, physical exam findings, and initial radiographic and laboratory data in the context of their chronic comorbidities is felt to place them at high risk for further clinical  deterioration. Furthermore, it is not anticipated that the patient will be medically stable for discharge from the hospital within 2 midnights of admission.   * I certify that at the point of admission it is my clinical judgment that the patient will require inpatient hospital care spanning beyond 2 midnights from the point of admission due to high intensity of service, high risk for further deterioration and high frequency of surveillance required.*  Level of care: Med-Surg    Lou Claretta HERO, MD 05/19/2024, 12:02 AM [facility] Pager: [phone] Isaiah 41:10   If 7PM-7AM, please contact night-coverage [email or web address] Password [email or web address]     [1]  Allergies Allergen Reactions   Ibuprofen Palpitations   Naproxen  Sodium     Other reaction(s): leg cramps   Nsaids Swelling   Valacyclovir      Other Reaction(s): Headaches / worsening of vertigo   Aspirin Palpitations   Tuberculin Tests Itching, Rash and Swelling   "

## 2024-05-18 NOTE — Progress Notes (Signed)
 Plan of Care Note for accepted transfer   Patient: Beverly Mcclain MRN: [id number]   DOA: 05/18/2024  Facility requesting transfer: [facility]   Requesting Provider: Sonny Showers, PA  Reason for transfer: Acute sigmoid diverticulitis with microperforation   Facility course: 63 yr old female with hx of breast cancer, ADHD, anxiety, and chronic pain presents with several days of left sided abdominal pain and is found to have acute sigmoid diverticulitis with microperforation.   She is afebrile and hemodynamically stable. Dr. Signe of general surgery was consulted and the patient was given IVF, Zosyn , fentanyl , and Zofran .   Plan of care: The patient is accepted for admission to Med-surg  unit, at [facility].   Author: Evalene GORMAN Sprinkles, MD 05/18/2024  Check [email or web address] for on-call coverage.  Nursing staff, Please call [facility] Admits & Consults System-Wide number on Amion as soon as patient's arrival, so appropriate admitting provider can evaluate the pt.

## 2024-05-18 NOTE — ED Provider Notes (Signed)
 Patient's care assumed at 7 PM.   Ct shows perforated sigmoid diverticulitis with microperforation.  Patient is advised of results.  IV antibiotics started.  General Surgery consulted.  Dr. Cameron advised she will see in consult.  Hospitalist consulted.  Dr. Charlton will admit.  Patient is given Zosyn  IV.  She is given a dose of fentanyl  for pain.    [place] Sonny POUR, PA-C 05/18/24 1958    Bari Roxie HERO, DO 05/18/24 2035

## 2024-05-19 DIAGNOSIS — F419 Anxiety disorder, unspecified: Secondary | ICD-10-CM

## 2024-05-19 DIAGNOSIS — M1990 Unspecified osteoarthritis, unspecified site: Secondary | ICD-10-CM

## 2024-05-19 DIAGNOSIS — R1084 Generalized abdominal pain: Secondary | ICD-10-CM

## 2024-05-19 DIAGNOSIS — K5732 Diverticulitis of large intestine without perforation or abscess without bleeding: Secondary | ICD-10-CM | POA: Diagnosis not present

## 2024-05-19 DIAGNOSIS — R748 Abnormal levels of other serum enzymes: Secondary | ICD-10-CM

## 2024-05-19 DIAGNOSIS — Z853 Personal history of malignant neoplasm of breast: Secondary | ICD-10-CM

## 2024-05-19 LAB — COMPREHENSIVE METABOLIC PANEL WITH GFR
ALT: 14 U/L (ref 0–44)
AST: 12 U/L — ABNORMAL LOW (ref 15–41)
Albumin: 3.6 g/dL (ref 3.5–5.0)
Alkaline Phosphatase: 88 U/L (ref 38–126)
Anion gap: 14 (ref 5–15)
BUN: 8 mg/dL (ref 8–23)
CO2: 23 mmol/L (ref 22–32)
Calcium: 9.4 mg/dL (ref 8.9–10.3)
Chloride: 103 mmol/L (ref 98–111)
Creatinine, Ser: 0.64 mg/dL (ref 0.44–1.00)
GFR, Estimated: >60 mL/min
Glucose, Bld: 103 mg/dL — ABNORMAL HIGH (ref 70–99)
Potassium: 3.9 mmol/L (ref 3.5–5.1)
Sodium: 140 mmol/L (ref 135–145)
Total Bilirubin: 0.6 mg/dL (ref 0.0–1.2)
Total Protein: 6.4 g/dL — ABNORMAL LOW (ref 6.5–8.1)

## 2024-05-19 LAB — CBC
HCT: 33.8 % — ABNORMAL LOW (ref 36.0–46.0)
Hemoglobin: 11.2 g/dL — ABNORMAL LOW (ref 12.0–15.0)
MCH: 30.1 pg (ref 26.0–34.0)
MCHC: 33.1 g/dL (ref 30.0–36.0)
MCV: 90.9 fL (ref 80.0–100.0)
Platelets: 296 K/uL (ref 150–400)
RBC: 3.72 MIL/uL — ABNORMAL LOW (ref 3.87–5.11)
RDW: 12 % (ref 11.5–15.5)
WBC: 9.4 K/uL (ref 4.0–10.5)
nRBC: 0 % (ref 0.0–0.2)

## 2024-05-19 LAB — HIV ANTIBODY (ROUTINE TESTING W REFLEX): HIV Screen 4th Generation wRfx: NONREACTIVE

## 2024-05-19 MED ORDER — PROCHLORPERAZINE EDISYLATE 10 MG/2ML IJ SOLN
5.0000 mg | Freq: Once | INTRAMUSCULAR | Status: AC | PRN
  Administered 2024-05-19: 5 mg via INTRAVENOUS
  Filled 2024-05-19: qty 2

## 2024-05-19 MED ORDER — FENTANYL CITRATE (PF) 50 MCG/ML IJ SOSY
50.0000 ug | PREFILLED_SYRINGE | INTRAMUSCULAR | Status: DC | PRN
  Administered 2024-05-19 (×2): 50 ug via INTRAVENOUS
  Filled 2024-05-19 (×2): qty 1

## 2024-05-19 MED ORDER — CYCLOBENZAPRINE HCL 5 MG PO TABS
7.5000 mg | ORAL_TABLET | Freq: Three times a day (TID) | ORAL | Status: DC
  Administered 2024-05-20 (×2): 7.5 mg via ORAL
  Filled 2024-05-19 (×3): qty 2

## 2024-05-19 NOTE — Plan of Care (Signed)

## 2024-05-19 NOTE — TOC Initial Note (Signed)
 Transition of Care [facility]) - Initial/Assessment Note    Patient Details  Name: Beverly Mcclain MRN: [id number] Date of Birth: 02/01/1961  Transition of [facility]) CM/SW Contact:    Beverly Glenys DASEN, RN Phone Number: 05/19/2024, 3:06 PM  Clinical Narrative:                 Home; No IP CM needs identified.  Expected Discharge Plan: Home/Self Care Barriers to Discharge: Continued Medical Work up   Patient Goals and CMS Choice Patient states their goals for this hospitalization and ongoing recovery are:: Home CMS Medicare.gov Compare Post Acute Care list provided to:: Patient Choice offered to / list presented to : [name] ownership interest in [facility].provided to:: Patient    Expected Discharge Plan and Services In-house Referral: NA Discharge Planning Services: CM Consult   Living arrangements for the past 2 months: Single Family Home                 DME Arranged: N/A DME Agency: NA       HH Arranged: NA HH Agency: NA        Prior Living Arrangements/Services Living arrangements for the past 2 months: Single Family Home Lives with:: Spouse Patient language and need for interpreter reviewed:: Yes Do you feel safe going back to the place where you live?: Yes      Need for Family Participation in Patient Care: No (Comment) Care giver support system in place?: Yes (comment) Current home services:  (NA) Criminal Activity/Legal Involvement Pertinent to Current Situation/Hospitalization: No - Comment as needed  Activities of Daily Living   ADL Screening (condition at time of admission) Independently performs ADLs?: Yes (appropriate for developmental age) Is the patient deaf or have difficulty hearing?: No Does the patient have difficulty seeing, even when wearing glasses/contacts?: No Does the patient have difficulty concentrating, remembering, or making decisions?: No  Permission Sought/Granted Permission sought to share information with :  Case Manager Permission granted to share information with : Yes, Verbal Permission Granted  Share Information with NAME: Beverly Mcclain  Daughter, Emergency Contact  [phone] (Mobile)           Emotional Assessment Appearance:: Appears stated age Attitude/Demeanor/Rapport: Engaged Affect (typically observed): Appropriate Orientation: : Oriented to Self, Oriented to Place, Oriented to  Time, Oriented to Situation Alcohol / Substance Use: Not Applicable Psych Involvement: No (comment)  Admission diagnosis:  Generalized abdominal pain [R10.84] Sigmoid diverticulitis [K57.32] Idiopathic acute pancreatitis, unspecified complication status [K85.00] Patient Active Problem List   Diagnosis Date Noted   Generalized abdominal pain 05/19/2024   Anxiety 05/19/2024   Arthritis 05/19/2024   History of breast cancer 05/19/2024   Elevated lipase 05/19/2024   Sigmoid diverticulitis 05/18/2024   Stress incontinence of urine 09/13/2021   Genetic testing 05/20/2020   Monoallelic mutation of BRIP1 gene 05/20/2020   Family history of leukemia    Family history of colon cancer    Malignant neoplasm of upper-outer quadrant of left breast in female, estrogen receptor positive (HCC) 12/19/2019   PCP:  Beverly Charleston, MD Pharmacy:   [id number] - [place], [place] - [address] [place] [address] [place] [address] [place] [address] Phone: [phone] Fax: [phone]     Social Drivers of Health (SDOH) Social History: SDOH Screenings   Food Insecurity: No Food Insecurity (05/18/2024)  Housing: Low Risk (05/18/2024)  Transportation Needs: No Transportation Needs (05/18/2024)  Utilities: Not At Risk (05/18/2024)  Tobacco Use:  Medium Risk (05/18/2024)   SDOH Interventions:     Readmission Risk Interventions     No data to display

## 2024-05-19 NOTE — Progress Notes (Signed)
 Consultation Progress Note   Patient: Beverly Mcclain FMW:[id number] DOB: 1960/08/19 DOA: 05/18/2024 DOS: the patient was seen and examined on 05/19/2024 Primary service: DibiaLandon BRAVO, MD  Brief hospital course: 63 yr old female with hx of breast cancer, ADHD, anxiety, and chronic pain presents with several days of left sided abdominal pain and is found to have acute sigmoid diverticulitis with microperforation.  Surgery recommends n.p.o. and continued antibiotics. Patient could possibly need surgery if pain continues and does not resolve. Surgery still following.    Assessment and Plan: Acute sigmoid diverticulitis - Patient with history of constipation and diverticulosis presented with 3 to 4 days of worsening abdominal pain with associated low-grade fever and anorexia - CT A/P shows acute  sigmoid diverticulitis with microperforation but no abscess - General Surgery recommends n.p.o. and continued antibiotics. -Patient could possibly need surgery if pain continues and does not resolve. -Continue IV antibiotics and pain management. -Continue IV fluids    # Elevated lipase - Lipase elevated to 160 on admission - No acute pancreatic abnormality on CT, likely elevated due to acute diverticulitis   # Anxiety - Reports she is currently not on any medications   # Arthritis # DDD - As needed Tylenol  for pain   # Hx of breast cancer - Status post left breast lumpectomy with radioactive seed in 2021         [facility] will continue to follow the patient.  Subjective: Patient complains of right lower back pain which appears to be musculoskeletal in nature.  She denies lower abdominal discomfort.  She tells me that Dilaudid /oxycodone  made her vomit and would prefer staying away from them.  Physical Exam: Vitals:   05/18/24 2246 05/19/24 0235 05/19/24 0345 05/19/24 0631  BP:  (!) 84/57 90/60 (!) 90/57  Pulse:  68 65 61  Resp:  16 16 16   Temp:  98.2 F (36.8 C) 98.6 F (37  C) 97.8 F (36.6 C)  TempSrc:  Oral Oral Oral  SpO2:  97% 97% 95%  Weight: 65.8 kg     Height: 5' 2 (1.575 m)      General  No acute Distress Eyes: PERRL, lids and conjunctivae normal ENMT: Mucous membranes are moist.   Neck: normal, supple, no masses, no thyromegaly Respiratory: clear to auscultation bilaterally, no wheezing, no crackles. Normal respiratory effort. No accessory muscle use.  Cardiovascular: Regular rate and rhythm, no murmurs / rubs / gallops Abdomen: Soft, nontender nondistended. Musculoskeletal: no clubbing / cyanosis. No joint deformity upper and lower extremities.  Skin: no rashes, lesions, ulcers. No induration Neurologic: Facial asymmetry, moving extremity spontaneously, speech fluent. Psychiatric: Normal judgment and insight. Alert and oriented x 3. Normal mood.   Data Reviewed:    CBC    Component Value Date/Time   WBC 9.4 05/19/2024 0421   RBC 3.72 (L) 05/19/2024 0421   HGB 11.2 (L) 05/19/2024 0421   HGB 13.6 09/13/2021 1606   HCT 33.8 (L) 05/19/2024 0421   PLT 296 05/19/2024 0421   PLT 298 09/13/2021 1606   MCV 90.9 05/19/2024 0421   MCH 30.1 05/19/2024 0421   MCHC 33.1 05/19/2024 0421   RDW 12.0 05/19/2024 0421   LYMPHSABS 2.1 09/13/2021 1606   MONOABS 0.5 09/13/2021 1606   EOSABS 0.1 09/13/2021 1606   BASOSABS 0.0 09/13/2021 1606   CMP     Component Value Date/Time   NA 140 05/19/2024 0421   K 3.9 05/19/2024 0421   CL 103 05/19/2024 0421   CO2  23 05/19/2024 0421   GLUCOSE 103 (H) 05/19/2024 0421   BUN 8 05/19/2024 0421   CREATININE 0.64 05/19/2024 0421   CREATININE 0.86 09/13/2021 1606   CALCIUM 9.4 05/19/2024 0421   PROT 6.4 (L) 05/19/2024 0421   ALBUMIN 3.6 05/19/2024 0421   AST 12 (L) 05/19/2024 0421   AST 16 09/13/2021 1606   ALT 14 05/19/2024 0421   ALT 11 09/13/2021 1606   ALKPHOS 88 05/19/2024 0421   BILITOT 0.6 05/19/2024 0421   BILITOT 0.4 09/13/2021 1606   GFRNONAA >60 05/19/2024 0421   GFRNONAA >60 09/13/2021 1606     Family Communication: Patient was updated at bedside.  Time spent: 35 minutes.  Author: Landon FORBES Baller, MD 05/19/2024 9:05 AM  For on call review [email or web address].

## 2024-05-19 NOTE — Consult Note (Signed)
 Reason for Consult: Diverticutitis Referring Physician: Dr. Carlota Margit VEAR Beverly Mcclain is an 63 y.o. female.  HPI: Patient presents to the outside ER secondary to abdominal pain.  States that it began Tuesday.  States that the pain was ongoing and unrelenting.  States that she was having solid bowel movements with some blood in the stool.  States this was bright red.  States that she has had multiple episodes in the past of diverticulitis.  She has treated this on her own without antibiotics. Patient previously had a colonoscopy 6 years ago that found polyps and diverticulosis.  She did not have a repeat colonoscopy thereafter. Patient went CT scan workup per EDP.  Patient was found to have diverticulitis with likely microperforation.  Patient with leukocytosis.  Patient was transferred to [facility] for further evaluation management. She was started antibiotics.  Patient states she has had previous C-section and laparoscopic fibroid removal.    Past Surgical History:  Procedure Laterality Date   BREAST BIOPSY Left 12/15/2019   x2   BREAST BIOPSY Left 01/20/2020   x2   BREAST BIOPSY Right 01/30/2020   x2   BREAST BIOPSY Left 02/13/2020   BREAST LUMPECTOMY Left 04/22/2020   BREAST LUMPECTOMY WITH RADIOACTIVE SEED LOCALIZATION Left 04/22/2020   Procedure: LEFT BREAST LUMPECTOMY WITH RADIOACTIVE SEED LOCALIZATION TIMES TWO;  Surgeon: Ethyl Lenis, MD;  Location: [facility] OR;  Service: General;  Laterality: Left;  LEFT PEC BLOCK   CESAREAN SECTION     COLONOSCOPY     FRACTURE SURGERY     left hand 5th finger   LITHOTRIPSY  2000   polyp removal     TOTAL HIP ARTHROPLASTY  12/2020   TUBAL LIGATION      Family History  Problem Relation Age of Onset   Heart attack Paternal Grandmother    Colon cancer Paternal Grandfather        dx unknown age   Leukemia Paternal Aunt 93   Cancer Paternal Uncle 50       unknown cancer type    Social History:  reports that she quit smoking  about 35 years ago. Her smoking use included cigarettes. She has never used smokeless tobacco. She reports current alcohol use. She reports current drug use. Drug: Marijuana.  Allergies: Allergies[1]  Medications: I have reviewed the patient's current medications.  Results for orders placed or performed during the hospital encounter of 05/18/24 (from the past 48 hours)  Lipase, blood     Status: Abnormal   Collection Time: 05/18/24 12:50 PM  Result Value Ref Range   Lipase 160 (H) 11 - 51 U/L    Comment: Performed at [facility], [address] [place], [place] [address]  Comprehensive metabolic panel     Status: Abnormal   Collection Time: 05/18/24 12:50 PM  Result Value Ref Range   Sodium 134 (L) 135 - 145 mmol/L   Potassium 4.0 3.5 - 5.1 mmol/L   Chloride 99 98 - 111 mmol/L   CO2 24 22 - 32 mmol/L   Glucose, Bld 115 (H) 70 - 99 mg/dL    Comment: Glucose reference range applies only to samples taken after fasting for at least 8 hours.   BUN 8 8 - 23 mg/dL   Creatinine, Ser 9.35 0.44 - 1.00 mg/dL   Calcium 89.7 8.9 - 89.6 mg/dL   Total Protein 7.6 6.5 - 8.1 g/dL   Albumin 4.4 3.5 - 5.0 g/dL   AST 15 15 - 41 U/L  ALT 19 0 - 44 U/L   Alkaline Phosphatase 101 38 - 126 U/L   Total Bilirubin 0.7 0.0 - 1.2 mg/dL   GFR, Estimated >39 >39 mL/min    Comment: (NOTE) Calculated using the CKD-EPI Creatinine Equation (2021)    Anion gap 11 5 - 15    Comment: Performed at [facility], [address], [place], [place] [address]  CBC     Status: Abnormal   Collection Time: 05/18/24 12:50 PM  Result Value Ref Range   WBC 10.6 (H) 4.0 - 10.5 K/uL   RBC 4.11 3.87 - 5.11 MIL/uL   Hemoglobin 12.4 12.0 - 15.0 g/dL   HCT 63.5 63.9 - 53.9 %   MCV 88.6 80.0 - 100.0 fL   MCH 30.2 26.0 - 34.0 pg   MCHC 34.1 30.0 - 36.0 g/dL   RDW 87.7 88.4 - 84.4 %   Platelets 324 150 - 400 K/uL   nRBC 0.0 0.0 - 0.2 %    Comment: Performed at [facility], [address] [place], [place] [address]  Urinalysis, Routine w reflex microscopic -Urine, Clean Catch     Status: None   Collection Time: 05/18/24 12:50 PM  Result Value Ref Range   Color, Urine YELLOW YELLOW   APPearance CLEAR CLEAR   Specific Gravity, Urine 1.008 1.005 - 1.030   pH 5.5 5.0 - 8.0   Glucose, UA NEGATIVE NEGATIVE mg/dL   Hgb urine dipstick NEGATIVE NEGATIVE   Bilirubin Urine NEGATIVE NEGATIVE   Ketones, ur NEGATIVE NEGATIVE mg/dL   Protein, ur NEGATIVE NEGATIVE mg/dL   Nitrite NEGATIVE NEGATIVE   Leukocytes,Ua NEGATIVE NEGATIVE    Comment: Performed at [facility], [address] [place], [place], [place] [address]  CBC     Status: Abnormal   Collection Time: 05/19/24  4:21 AM  Result Value Ref Range   WBC 9.4 4.0 - 10.5 K/uL   RBC 3.72 (L) 3.87 - 5.11 MIL/uL   Hemoglobin 11.2 (L) 12.0 - 15.0 g/dL   HCT 66.1 (L) 63.9 - 53.9 %   MCV 90.9 80.0 - 100.0 fL   MCH 30.1 26.0 - 34.0 pg   MCHC 33.1 30.0 - 36.0 g/dL   RDW 87.9 88.4 - 84.4 %   Platelets 296 150 - 400 K/uL   nRBC 0.0 0.0 - 0.2 %    Comment: Performed at [facility], [address], [place], [place] [address]  Comprehensive metabolic panel with GFR     Status: Abnormal   Collection Time: 05/19/24  4:21 AM  Result Value Ref Range   Sodium 140 135 - 145 mmol/L   Potassium 3.9 3.5 - 5.1 mmol/L   Chloride 103 98 - 111 mmol/L   CO2 23 22 - 32 mmol/L   Glucose, Bld 103 (H) 70 - 99 mg/dL    Comment: Glucose reference range applies only to samples taken after fasting for at least 8 hours.   BUN 8 8 - 23 mg/dL   Creatinine, Ser 9.35 0.44 - 1.00 mg/dL   Calcium 9.4 8.9 - 89.6 mg/dL   Total Protein 6.4 (L) 6.5 - 8.1 g/dL   Albumin 3.6 3.5 - 5.0 g/dL   AST 12 (L) 15 - 41 U/L   ALT 14 0 - 44 U/L   Alkaline Phosphatase 88 38 - 126 U/L   Total Bilirubin 0.6 0.0 - 1.2 mg/dL   GFR, Estimated >39 >39 mL/min    Comment: (NOTE) Calculated using the  CKD-EPI Creatinine  Equation (2021)    Anion gap 14 5 - 15    Comment: Performed at [facility], [address], [place], [place] [address]    CT ABDOMEN PELVIS W CONTRAST Result Date: 05/18/2024 EXAM: CT ABDOMEN AND PELVIS WITH CONTRAST 05/18/2024 06:13:28 PM TECHNIQUE: CT of the abdomen and pelvis was performed with the administration of 100 mL of iohexol  (OMNIPAQUE ) 300 MG/ML solution. Multiplanar reformatted images are provided for review. Automated exposure control, iterative reconstruction, and/or weight-based adjustment of the mA/kV was utilized to reduce the radiation dose to as low as reasonably achievable. COMPARISON: CT abdomen and pelvis 04/26/2021. CLINICAL HISTORY: Pancreatitis, acute, severe. FINDINGS: LOWER CHEST: No acute abnormality. LIVER: The liver is unremarkable. GALLBLADDER AND BILE DUCTS: Gallbladder is unremarkable. No biliary ductal dilatation. SPLEEN: No acute abnormality. PANCREAS: No acute abnormality. ADRENAL GLANDS: No acute abnormality. KIDNEYS, URETERS AND BLADDER: Subcentimeter rounded hypodense areas are seen in the left kidney, unchanged, likely angiomyolipomas. Fat containing lesion in the inferior pole of the right kidney measuring 12 mm is also unchanged, likely angiomyolipoma. No stones in the kidneys or ureters. No hydronephrosis. No perinephric or periureteral stranding. Limited evaluation the bladder is within normal limits. GI AND BOWEL: Stomach demonstrates no acute abnormality. There is sigmoid colon diverticulosis. There is wall thickening and inflammation of the sigmoid colon compatible with acute diverticulitis. Microperforation is present. No evidence for abscess. The appendix is likely surgically absent. There is no bowel obstruction. PERITONEUM AND RETROPERITONEUM: No ascites. No free air. VASCULATURE: Aorta is normal in caliber. LYMPH NODES: No lymphadenopathy. REPRODUCTIVE ORGANS: No acute abnormality. BONES AND SOFT TISSUES: Right hip arthroplasty  present. No acute osseous abnormality. No focal soft tissue abnormality. IMPRESSION: 1. Acute sigmoid diverticulitis with microperforation. No abscess. 2. Bilateral renal angiomyolipomas, unchanged. No follow-up imaging is recommended. Electronically signed by: Greig Pique MD 05/18/2024 07:12 PM EST RP Workstation: [id number]    Review of Systems  Constitutional:  Negative for chills and fever.  HENT:  Negative for ear discharge, hearing loss and sore throat.   Eyes:  Negative for discharge.  Respiratory:  Negative for cough and shortness of breath.   Cardiovascular:  Negative for chest pain and leg swelling.  Gastrointestinal:  Positive for abdominal pain and constipation. Negative for diarrhea, nausea and vomiting.  Musculoskeletal:  Negative for myalgias and neck pain.  Skin:  Negative for rash.  Allergic/Immunologic: Negative for environmental allergies.  Neurological:  Negative for dizziness and seizures.  Hematological:  Does not bruise/bleed easily.  Psychiatric/Behavioral:  Negative for suicidal ideas.   All other systems reviewed and are negative.  Blood pressure (!) 90/57, pulse 61, temperature 97.8 F (36.6 C), temperature source Oral, resp. rate 16, height 5' 2 (1.575 m), weight 65.8 kg, SpO2 95%. Physical Exam Constitutional:      Appearance: She is well-developed.     Comments: Conversant No acute distress  HENT:     Head: Normocephalic and atraumatic.  Eyes:     General: Lids are normal. No scleral icterus.    Pupils: Pupils are equal, round, and reactive to light.     Comments: Pupils are equal round and reactive No lid lag Moist conjunctiva  Neck:     Thyroid: No thyromegaly.     Trachea: No tracheal tenderness.     Comments: No cervical lymphadenopathy Cardiovascular:     Rate and Rhythm: Normal rate and regular rhythm.     Heart sounds: No murmur heard. Pulmonary:     Effort:  Pulmonary effort is normal.     Breath sounds: Normal breath sounds. No  wheezing or rales.  Abdominal:     Tenderness: There is abdominal tenderness in the left upper quadrant.     Hernia: No hernia is present.  Musculoskeletal:     Cervical back: Normal range of motion and neck supple.  Skin:    General: Skin is warm.     Findings: No rash.     Nails: There is no clubbing.     Comments: Normal skin turgor  Neurological:     Mental Status: She is alert and oriented to person, place, and time.     Comments: Normal gait and station  Psychiatric:        Mood and Affect: Mood normal.        Thought Content: Thought content normal.        Judgment: Judgment normal.     Comments: Appropriate affect     Assessment/Plan: 63 year old female with microperforation, diverticulitis   1.  Recommend continued n.p.o. for now.  Patient has continued abdominal pain. 2.  Agree with antibiotics. 3.  Discussed with her the possibilities of need for further surgery if pain continues and does not resolve.  We discussed Hartman's procedure with colostomy that be temporary. 4.  Will follow along.  Beverly Mcclain 05/19/2024, 7:34 AM         [1]  Allergies Allergen Reactions   Ibuprofen Palpitations   Naproxen Sodium     Other reaction(s): leg cramps   Nsaids Swelling   Valacyclovir      Other Reaction(s): Headaches / worsening of vertigo   Aspirin Palpitations   Tuberculin Tests Itching, Rash and Swelling

## 2024-05-19 NOTE — Plan of Care (Signed)

## 2024-05-20 DIAGNOSIS — K5732 Diverticulitis of large intestine without perforation or abscess without bleeding: Secondary | ICD-10-CM | POA: Diagnosis not present

## 2024-05-20 MED ORDER — SIMETHICONE 80 MG PO CHEW
80.0000 mg | CHEWABLE_TABLET | Freq: Four times a day (QID) | ORAL | Status: DC | PRN
  Administered 2024-05-20: 80 mg via ORAL
  Filled 2024-05-20: qty 1

## 2024-05-20 NOTE — Plan of Care (Signed)

## 2024-05-20 NOTE — Progress Notes (Signed)
 Consultation Progress Note   Patient: Beverly Mcclain FMW:[id number] DOB: 07-09-1960 DOA: 05/18/2024 DOS: the patient was seen and examined on 05/20/2024 Primary service: DibiaLandon BRAVO, MD  Brief hospital course: 63 yr old female with hx of breast cancer, ADHD, anxiety, and chronic pain presents with several days of left sided abdominal pain and is found to have acute sigmoid diverticulitis with microperforation.  Surgery recommends continued antibiotics, started on clears 12/30 Patient could possibly need surgery if pain continues and does not resolve. Surgery still following.    Assessment and Plan: Acute sigmoid diverticulitis - Patient with history of constipation and diverticulosis presented with 3 to 4 days of worsening abdominal pain with associated low-grade fever and anorexia - CT A/P shows acute  sigmoid diverticulitis with microperforation but no abscess - General Surgery advanced diet to clears, monitor for response. -Patient could possibly need surgery if pain continues and does not resolve. -Continue IV antibiotics and pain management. -Discontinued IV fluids per patient request, she states that her hands were puffy.    # Elevated lipase - Lipase elevated to 160 on admission - No acute pancreatic abnormality on CT, likely elevated due to acute diverticulitis   # Anxiety - Reports she is currently not on any medications   # Arthritis # DDD - As needed Tylenol  for pain   # Hx of breast cancer - Status post left breast lumpectomy with radioactive seed in 2021         TRH will continue to follow the patient.  Subjective: Patient complains of right lower back pain which appears to be musculoskeletal in nature.  She denies lower abdominal discomfort.  She tells me that Dilaudid /oxycodone  made her vomit and would prefer staying away from them.  Physical Exam: Vitals:   05/19/24 1349 05/19/24 1905 05/19/24 1947 05/20/24 0439  BP: (!) 95/53 (!) 105/45 (!)  108/58 (!) 96/58  Pulse: 68 66 62 62  Resp: 14     Temp: 98 F (36.7 C) (!) 97.5 F (36.4 C)  98.5 F (36.9 C)  TempSrc: Oral Oral  Oral  SpO2: 97% 99%  100%  Weight:      Height:       General  No acute Distress Eyes: PERRL, lids and conjunctivae normal ENMT: Mucous membranes are moist.   Neck: normal, supple, no masses, no thyromegaly Respiratory: clear to auscultation bilaterally, no wheezing, no crackles. Normal respiratory effort. No accessory muscle use.  Cardiovascular: Regular rate and rhythm, no murmurs / rubs / gallops Abdomen: Soft, nontender nondistended. Musculoskeletal: no clubbing / cyanosis. No joint deformity upper and lower extremities.  Skin: no rashes, lesions, ulcers. No induration Neurologic: Facial asymmetry, moving extremity spontaneously, speech fluent. Psychiatric: Normal judgment and insight. Alert and oriented x 3. Normal mood.   Data Reviewed:    CBC    Component Value Date/Time   WBC 9.4 05/19/2024 0421   RBC 3.72 (L) 05/19/2024 0421   HGB 11.2 (L) 05/19/2024 0421   HGB 13.6 09/13/2021 1606   HCT 33.8 (L) 05/19/2024 0421   PLT 296 05/19/2024 0421   PLT 298 09/13/2021 1606   MCV 90.9 05/19/2024 0421   MCH 30.1 05/19/2024 0421   MCHC 33.1 05/19/2024 0421   RDW 12.0 05/19/2024 0421   LYMPHSABS 2.1 09/13/2021 1606   MONOABS 0.5 09/13/2021 1606   EOSABS 0.1 09/13/2021 1606   BASOSABS 0.0 09/13/2021 1606   CMP     Component Value Date/Time   NA 140 05/19/2024 0421  K 3.9 05/19/2024 0421   CL 103 05/19/2024 0421   CO2 23 05/19/2024 0421   GLUCOSE 103 (H) 05/19/2024 0421   BUN 8 05/19/2024 0421   CREATININE 0.64 05/19/2024 0421   CREATININE 0.86 09/13/2021 1606   CALCIUM 9.4 05/19/2024 0421   PROT 6.4 (L) 05/19/2024 0421   ALBUMIN 3.6 05/19/2024 0421   AST 12 (L) 05/19/2024 0421   AST 16 09/13/2021 1606   ALT 14 05/19/2024 0421   ALT 11 09/13/2021 1606   ALKPHOS 88 05/19/2024 0421   BILITOT 0.6 05/19/2024 0421   BILITOT 0.4  09/13/2021 1606   GFRNONAA >60 05/19/2024 0421   GFRNONAA >60 09/13/2021 1606    Family Communication: Patient was updated at bedside.  Time spent: 35 minutes.  Author: Landon FORBES Baller, MD 05/20/2024 12:57 PM  For on call review [email or web address].

## 2024-05-20 NOTE — Progress Notes (Signed)
 "    Subjective/Chief Complaint: PT doing better today No BMs   Objective: Vital signs in last 24 hours: Temp:  [97.5 F (36.4 C)-98.5 F (36.9 C)] 98.5 F (36.9 C) (12/30 0439) Pulse Rate:  [62-68] 62 (12/30 0439) Resp:  [14] 14 (12/29 1349) BP: (95-108)/(45-58) 96/58 (12/30 0439) SpO2:  [97 %-100 %] 100 % (12/30 0439) Last BM Date : 05/18/24 (per pt)  Intake/Output from previous day: 12/29 0701 - 12/30 0700 In: 1432.4 [I.V.:1307.5; IV Piggyback:124.9] Out: -  Intake/Output this shift: No intake/output data recorded.  PE:  Constitutional: No acute distress, conversant, appears states age. Eyes: Anicteric sclerae, moist conjunctiva, no lid lag Lungs: Clear to auscultation bilaterally, normal respiratory effort CV: regular rate and rhythm, no murmurs, no peripheral edema, pedal pulses 2+ GI: Soft, no masses or hepatosplenomegaly, non-tender to palpation Skin: No rashes, palpation reveals normal turgor Psychiatric: appropriate judgment and insight, oriented to person, place, and time   Lab Results:  Recent Labs    05/18/24 1250 05/19/24 0421  WBC 10.6* 9.4  HGB 12.4 11.2*  HCT 36.4 33.8*  PLT 324 296   BMET Recent Labs    05/18/24 1250 05/19/24 0421  NA 134* 140  K 4.0 3.9  CL 99 103  CO2 24 23  GLUCOSE 115* 103*  BUN 8 8  CREATININE 0.64 0.64  CALCIUM 10.2 9.4   PT/INR No results for input(s): LABPROT, INR in the last 72 hours. ABG No results for input(s): PHART, HCO3 in the last 72 hours.  Invalid input(s): PCO2, PO2  Studies/Results: CT ABDOMEN PELVIS W CONTRAST Result Date: 05/18/2024 EXAM: CT ABDOMEN AND PELVIS WITH CONTRAST 05/18/2024 06:13:28 PM TECHNIQUE: CT of the abdomen and pelvis was performed with the administration of 100 mL of iohexol  (OMNIPAQUE ) 300 MG/ML solution. Multiplanar reformatted images are provided for review. Automated exposure control, iterative reconstruction, and/or weight-based adjustment of the mA/kV was  utilized to reduce the radiation dose to as low as reasonably achievable. COMPARISON: CT abdomen and pelvis 04/26/2021. CLINICAL HISTORY: Pancreatitis, acute, severe. FINDINGS: LOWER CHEST: No acute abnormality. LIVER: The liver is unremarkable. GALLBLADDER AND BILE DUCTS: Gallbladder is unremarkable. No biliary ductal dilatation. SPLEEN: No acute abnormality. PANCREAS: No acute abnormality. ADRENAL GLANDS: No acute abnormality. KIDNEYS, URETERS AND BLADDER: Subcentimeter rounded hypodense areas are seen in the left kidney, unchanged, likely angiomyolipomas. Fat containing lesion in the inferior pole of the right kidney measuring 12 mm is also unchanged, likely angiomyolipoma. No stones in the kidneys or ureters. No hydronephrosis. No perinephric or periureteral stranding. Limited evaluation the bladder is within normal limits. GI AND BOWEL: Stomach demonstrates no acute abnormality. There is sigmoid colon diverticulosis. There is wall thickening and inflammation of the sigmoid colon compatible with acute diverticulitis. Microperforation is present. No evidence for abscess. The appendix is likely surgically absent. There is no bowel obstruction. PERITONEUM AND RETROPERITONEUM: No ascites. No free air. VASCULATURE: Aorta is normal in caliber. LYMPH NODES: No lymphadenopathy. REPRODUCTIVE ORGANS: No acute abnormality. BONES AND SOFT TISSUES: Right hip arthroplasty present. No acute osseous abnormality. No focal soft tissue abnormality. IMPRESSION: 1. Acute sigmoid diverticulitis with microperforation. No abscess. 2. Bilateral renal angiomyolipomas, unchanged. No follow-up imaging is recommended. Electronically signed by: Greig Pique MD 05/18/2024 07:12 PM EST RP Workstation: [id number]    Anti-infectives: Anti-infectives (From admission, onward)    Start     Dose/Rate Route Frequency Ordered Stop   05/19/24 0400  piperacillin -tazobactam (ZOSYN ) IVPB 3.375 g        3.375  g 12.5 mL/hr over 240 Minutes  Intravenous Every 8 hours 05/18/24 2357     05/18/24 1930  piperacillin -tazobactam (ZOSYN ) IVPB 3.375 g        3.375 g 100 mL/hr over 30 Minutes Intravenous  Once 05/18/24 1922 05/18/24 2007       Assessment/Plan: 63 year old female with microperforation, diverticulitis     1.  Resolving abd pain.  Will start on clears and go slow. 2.  Agree with antibiotics. 3.  Discussed with her the possibilities of need for further surgery if pain continues and does not resolve.  We discussed Hartman's procedure with colostomy that be temporary. 4.  Will follow along.  LOS: 2 days    Beverly Mcclain 05/20/2024  "

## 2024-05-20 NOTE — Plan of Care (Signed)

## 2024-05-21 ENCOUNTER — Other Ambulatory Visit (HOSPITAL_COMMUNITY): Payer: Self-pay

## 2024-05-21 LAB — CBC
HCT: 33.5 % — ABNORMAL LOW (ref 36.0–46.0)
Hemoglobin: 11.4 g/dL — ABNORMAL LOW (ref 12.0–15.0)
MCH: 30.1 pg (ref 26.0–34.0)
MCHC: 34 g/dL (ref 30.0–36.0)
MCV: 88.4 fL (ref 80.0–100.0)
Platelets: 373 K/uL (ref 150–400)
RBC: 3.79 MIL/uL — ABNORMAL LOW (ref 3.87–5.11)
RDW: 11.8 % (ref 11.5–15.5)
WBC: 6.7 K/uL (ref 4.0–10.5)
nRBC: 0 % (ref 0.0–0.2)

## 2024-05-21 MED ORDER — BISACODYL 5 MG PO TBEC
5.0000 mg | DELAYED_RELEASE_TABLET | Freq: Every day | ORAL | 0 refills | Status: AC | PRN
  Filled 2024-05-21: qty 30, 30d supply, fill #0

## 2024-05-21 MED ORDER — CIPROFLOXACIN HCL 500 MG PO TABS
500.0000 mg | ORAL_TABLET | Freq: Two times a day (BID) | ORAL | 0 refills | Status: AC
  Filled 2024-05-21: qty 20, 10d supply, fill #0

## 2024-05-21 MED ORDER — HYDROCODONE-ACETAMINOPHEN 5-325 MG PO TABS
0.5000 | ORAL_TABLET | Freq: Two times a day (BID) | ORAL | 0 refills | Status: AC | PRN
  Filled 2024-05-21: qty 20, 20d supply, fill #0

## 2024-05-21 NOTE — Assessment & Plan Note (Addendum)
 Reports she is currently not on any medications

## 2024-05-21 NOTE — Assessment & Plan Note (Addendum)
 Lipase elevated to 160 on admission No acute pancreatic abnormality on CT, likely elevated due to acute diverticulitis

## 2024-05-21 NOTE — Discharge Summary (Signed)
 " Physician Discharge Summary   Patient: Beverly Mcclain MRN: [id number] DOB: [birth year]  Admit date:     05/18/2024  Discharge date: 05/21/2024  Discharge Physician: Delon Herald   PCP: Gib Charleston, MD   Recommendations at discharge:   Advance diet as tolerated Complete antibiotics (Ciprofloxacin twice daily for 10 days) You are being prescribed a limited number of narcotic pain pills; take as directed and do not drive or make important decisions while taking this medication  Follow up with Dr. Gib in 1-2 weeks Follow up with surgery in 3 weeks; call for an appointment  Discharge Diagnoses: Principal Problem:   Sigmoid diverticulitis Active Problems:   Anxiety   History of breast cancer   Elevated lipase   Hospital Course: 63yo with h/o breast CA, ADHD/anxiety, and chronic pain syndrome who presented on 12/28 with abdominal pain.  CT with acute sigmoid diverticulitis with microperforation.  She was started on antibiotics and has successfully advanced diet.  Appears to be stable for dc today on PO antibiotics.  Assessment and Plan:  Assessment & Plan Sigmoid diverticulitis Patient with history of constipation and diverticulosis presented with 3 to 4 days of worsening abdominal pain with associated low-grade fever and anorexia CT A/P showed acute sigmoid diverticulitis with microperforation but no abscess General Surgery consulted Treated with antibiotics and pain management She has improved and feels safe for dc today Anxiety Reports she is currently not on any medications  History of breast cancer Status post left breast lumpectomy with radioactive seed in 2021  Elevated lipase Lipase elevated to 160 on admission No acute pancreatic abnormality on CT, likely elevated due to acute diverticulitis      Consultants: Surgery  Procedures: None  Antibiotics: Zosyn  12/28-    Pain control - [place]  Controlled Substance Reporting System database was  reviewed. and patient was instructed, not to drive, operate heavy machinery, perform activities at heights, swimming or participation in water activities or provide baby-sitting services while on Pain, Sleep and Anxiety Medications; until their outpatient Physician has advised to do so again. Also recommended to not to take more than prescribed Pain, Sleep and Anxiety Medications.   Disposition: Home Diet recommendation:  Clear liquid diet, advance as tolerated DISCHARGE MEDICATION: Allergies as of 05/21/2024       Reactions   Ibuprofen Palpitations   Naproxen Sodium    Other reaction(s): leg cramps   Nsaids Swelling   Percocet [oxycodone -acetaminophen ] Nausea And Vomiting   Valacyclovir     Other Reaction(s): Headaches / worsening of vertigo   Aspirin Palpitations   Cephalexin Rash   Penicillins Rash   Tuberculin Tests Itching, Rash, Swelling        Medication List     TAKE these medications    acetaminophen  500 MG tablet Commonly known as: TYLENOL  Take 1,000 mg by mouth every 6 (six) hours as needed for moderate pain.   ALPRAZolam 0.5 MG tablet Commonly known as: XANAX Take 0.25 mg by mouth daily as needed for anxiety.   APPLE CIDER VINEGAR PO Take 5 mLs by mouth daily.   ascorbic acid 1000 MG tablet Commonly known as: VITAMIN C Take 2,000 mg by mouth daily.   b complex vitamins capsule Take 1 capsule by mouth daily.   bisacodyl  5 MG EC tablet Commonly known as: DULCOLAX Take 1 tablet (5 mg total) by mouth daily as needed for moderate constipation.   ciprofloxacin 500 MG tablet Commonly known as: Cipro Take 1 tablet (500 mg total) by mouth 2 (  two) times daily for 10 days.   diphenhydrAMINE 25 MG tablet Commonly known as: BENADRYL Take 25 mg by mouth daily as needed for allergies.   HYDROcodone -acetaminophen  5-325 MG tablet Commonly known as: NORCO/VICODIN Take 0.5 tablets by mouth 2 (two) times daily as needed for moderate pain (pain score 4-6).   K2  PLUS D3 PO Take 1 tablet by mouth daily.   MAGNESIUM COMPLEX PO Take 1 tablet by mouth daily.   OMEGA-3 PO Take 1 capsule by mouth daily.   TURMERIC PO Take 1 capsule by mouth daily.        Follow-up Information     [facility], [place]. Schedule an appointment as soon as possible for a visit in 3 week(s).   Specialty: General Surgery Why: As needed Contact information: [address] [place]  [address] [phone]        Gib Charleston, MD. Schedule an appointment as soon as possible for a visit in 1 week(s).   Specialty: Family Medicine Contact information: [phone] [place] [address] [phone]                Discharge Exam:   Subjective: Feeling better.  Abdominal pain is mostly resolved, having mild L flank pain with ambulation.  Wants to go home.   Objective: Vitals:   05/21/24 0524 05/21/24 1132  BP: 103/70 102/72  Pulse: 63 74  Resp: 18 18  Temp: 98.2 F (36.8 C) 98.1 F (36.7 C)  SpO2: 99% 100%    Intake/Output Summary (Last 24 hours) at 05/21/2024 1403 Last data filed at 05/21/2024 1000 Gross per 24 hour  Intake 360 ml  Output --  Net 360 ml   Filed Weights   05/18/24 2246  Weight: 65.8 kg    Exam:  General:  Appears calm and comfortable and is in NAD Eyes:  normal lids, iris ENT:  grossly normal hearing, lips & tongue, mmm Cardiovascular:  RRR, no m/r/g. No LE edema.  Respiratory:   CTA bilaterally with no wheezes/rales/rhonchi.  Normal respiratory effort. Abdomen:  soft, NT, ND; no CVAT Skin:  no rash or induration seen on limited exam Musculoskeletal:  grossly normal tone BUE/BLE, good ROM, no bony abnormality Psychiatric:  grossly normal mood and affect, speech fluent and appropriate, AOx3 Neurologic:  CN 2-12 grossly intact, moves all extremities in coordinated fashion  Data Reviewed: I have reviewed the patient's lab results since admission.   Pertinent labs for today include:   WBC 6.7 Hgb 11.4    Condition at discharge: improving  The results of significant diagnostics from this hospitalization (including imaging, microbiology, ancillary and laboratory) are listed below for reference.   Imaging Studies: CT ABDOMEN PELVIS W CONTRAST Result Date: 05/18/2024 EXAM: CT ABDOMEN AND PELVIS WITH CONTRAST 05/18/2024 06:13:28 PM TECHNIQUE: CT of the abdomen and pelvis was performed with the administration of 100 mL of iohexol  (OMNIPAQUE ) 300 MG/ML solution. Multiplanar reformatted images are provided for review. Automated exposure control, iterative reconstruction, and/or weight-based adjustment of the mA/kV was utilized to reduce the radiation dose to as low as reasonably achievable. COMPARISON: CT abdomen and pelvis 04/26/2021. CLINICAL HISTORY: Pancreatitis, acute, severe. FINDINGS: LOWER CHEST: No acute abnormality. LIVER: The liver is unremarkable. GALLBLADDER AND BILE DUCTS: Gallbladder is unremarkable. No biliary ductal dilatation. SPLEEN: No acute abnormality. PANCREAS: No acute abnormality. ADRENAL GLANDS: No acute abnormality. KIDNEYS, URETERS AND BLADDER: Subcentimeter rounded hypodense areas are seen in the left kidney, unchanged, likely angiomyolipomas. Fat  containing lesion in the inferior pole of the right kidney measuring 12 mm is also unchanged, likely angiomyolipoma. No stones in the kidneys or ureters. No hydronephrosis. No perinephric or periureteral stranding. Limited evaluation the bladder is within normal limits. GI AND BOWEL: Stomach demonstrates no acute abnormality. There is sigmoid colon diverticulosis. There is wall thickening and inflammation of the sigmoid colon compatible with acute diverticulitis. Microperforation is present. No evidence for abscess. The appendix is likely surgically absent. There is no bowel obstruction. PERITONEUM AND RETROPERITONEUM: No ascites. No free air. VASCULATURE: Aorta is normal in caliber.  LYMPH NODES: No lymphadenopathy. REPRODUCTIVE ORGANS: No acute abnormality. BONES AND SOFT TISSUES: Right hip arthroplasty present. No acute osseous abnormality. No focal soft tissue abnormality. IMPRESSION: 1. Acute sigmoid diverticulitis with microperforation. No abscess. 2. Bilateral renal angiomyolipomas, unchanged. No follow-up imaging is recommended. Electronically signed by: Greig Pique MD 05/18/2024 07:12 PM EST RP Workstation: [id number]    Microbiology: Results for orders placed or performed during the hospital encounter of 04/19/20  SARS CORONAVIRUS 2 (TAT 6-24 HRS) Nasopharyngeal Nasopharyngeal Swab     Status: None   Collection Time: 04/19/20  3:05 PM   Specimen: Nasopharyngeal Swab  Result Value Ref Range Status   SARS Coronavirus 2 NEGATIVE NEGATIVE Final    Comment: (NOTE) SARS-CoV-2 target nucleic acids are NOT DETECTED.  The SARS-CoV-2 RNA is generally detectable in upper and lower respiratory specimens during the acute phase of infection. Negative results do not preclude SARS-CoV-2 infection, do not rule out co-infections with other pathogens, and should not be used as the sole basis for treatment or other patient management decisions. Negative results must be combined with clinical observations, patient history, and epidemiological information. The expected result is Negative.  Fact Sheet for Patients: hairslick.no  Fact Sheet for Healthcare Providers: quierodirigir.com  This test is not yet approved or cleared by the [place]  FDA and  has been authorized for detection and/or diagnosis of SARS-CoV-2 by FDA under an Emergency Use Authorization (EUA). This EUA will remain  in effect (meaning this test can be used) for the duration of the COVID-19 declaration under Se ction 564(b)(1) of the Act, 21 U.S.C. section 360bbb-3(b)(1), unless the authorization is terminated or revoked sooner.  Performed at [facility], [address] [place], [place] [address]     Labs: CBC: Recent Labs  Lab 05/18/24 1250 05/19/24 0421 05/21/24 0459  WBC 10.6* 9.4 6.7  HGB 12.4 11.2* 11.4*  HCT 36.4 33.8* 33.5*  MCV 88.6 90.9 88.4  PLT 324 296 373   Basic Metabolic Panel: Recent Labs  Lab 05/18/24 1250 05/19/24 0421  NA 134* 140  K 4.0 3.9  CL 99 103  CO2 24 23  GLUCOSE 115* 103*  BUN 8 8  CREATININE 0.64 0.64  CALCIUM 10.2 9.4   Liver Function Tests: Recent Labs  Lab 05/18/24 1250 05/19/24 0421  AST 15 12*  ALT 19 14  ALKPHOS 101 88  BILITOT 0.7 0.6  PROT 7.6 6.4*  ALBUMIN 4.4 3.6   CBG: No results for input(s): GLUCAP in the last 168 hours.  Discharge time spent: greater than 30 minutes.  Signed: Delon Herald, MD [facility] 05/21/2024 "

## 2024-05-21 NOTE — Assessment & Plan Note (Addendum)
 Patient with history of constipation and diverticulosis presented with 3 to 4 days of worsening abdominal pain with associated low-grade fever and anorexia CT A/P showed acute sigmoid diverticulitis with microperforation but no abscess General Surgery consulted Treated with antibiotics and pain management She has improved and feels safe for dc today

## 2024-05-21 NOTE — Assessment & Plan Note (Addendum)
 Status post left breast lumpectomy with radioactive seed in 2021

## 2024-05-21 NOTE — Hospital Course (Signed)
 63yo with h/o breast CA, ADHD/anxiety, and chronic pain syndrome who presented on 12/28 with abdominal pain.  CT with acute sigmoid diverticulitis with microperforation.  She was started on antibiotics and has successfully advanced diet.  Appears to be stable for dc today on PO antibiotics.

## 2024-05-21 NOTE — Progress Notes (Signed)
 Discharge instructions provided. All questions asked and answered. Medications delivered. Patient discharged off unit with SWAT nurse.

## 2024-05-21 NOTE — Progress Notes (Signed)
"   ° °  Subjective/Chief Complaint: Pt doing well this AM Tol CLD +BM   Objective: Vital signs in last 24 hours: Temp:  [98.2 F (36.8 C)-98.9 F (37.2 C)] 98.2 F (36.8 C) (12/31 0524) Pulse Rate:  [61-74] 63 (12/31 0524) Resp:  [16-18] 18 (12/31 0524) BP: (99-110)/(62-70) 103/70 (12/31 0524) SpO2:  [98 %-99 %] 99 % (12/31 0524) Last BM Date : 05/21/24  Intake/Output from previous day: No intake/output data recorded. Intake/Output this shift: No intake/output data recorded.  PE:  Constitutional: No acute distress, conversant, appears states age. Eyes: Anicteric sclerae, moist conjunctiva, no lid lag Lungs: Clear to auscultation bilaterally, normal respiratory effort CV: regular rate and rhythm, no murmurs, no peripheral edema, pedal pulses 2+ GI: Soft, no masses or hepatosplenomegaly, non-tender to palpation Skin: No rashes, palpation reveals normal turgor Psychiatric: appropriate judgment and insight, oriented to person, place, and time   Lab Results:  Recent Labs    05/19/24 0421 05/21/24 0459  WBC 9.4 6.7  HGB 11.2* 11.4*  HCT 33.8* 33.5*  PLT 296 373   BMET Recent Labs    05/18/24 1250 05/19/24 0421  NA 134* 140  K 4.0 3.9  CL 99 103  CO2 24 23  GLUCOSE 115* 103*  BUN 8 8  CREATININE 0.64 0.64  CALCIUM 10.2 9.4   PT/INR No results for input(s): LABPROT, INR in the last 72 hours. ABG No results for input(s): PHART, HCO3 in the last 72 hours.  Invalid input(s): PCO2, PO2  Studies/Results: No results found.  Anti-infectives: Anti-infectives (From admission, onward)    Start     Dose/Rate Route Frequency Ordered Stop   05/19/24 0400  piperacillin -tazobactam (ZOSYN ) IVPB 3.375 g        3.375 g 12.5 mL/hr over 240 Minutes Intravenous Every 8 hours 05/18/24 2357     05/18/24 1930  piperacillin -tazobactam (ZOSYN ) IVPB 3.375 g        3.375 g 100 mL/hr over 30 Minutes Intravenous  Once 05/18/24 1922 05/18/24 2007        Assessment/Plan: 63 year old female with microperforation, diverticulitis   1.  Resolving abd pain.  Will adv diet as tol 2.  Agree with antibiotics and will need DC with abx 3.  Will have f/u in chart to d/w out colorectal MDs possible outpt surgery  LOS: 3 days    Beverly Mcclain 05/21/2024  "

## 2024-05-21 NOTE — Progress Notes (Signed)
 Discharge meds in a secure bag delivered to patient by this RN

## 2024-05-21 NOTE — Plan of Care (Signed)
   Problem: Education: Goal: Knowledge of General Education information will improve Description Including pain rating scale, medication(s)/side effects and non-pharmacologic comfort measures Outcome: Progressing   Problem: Health Behavior/Discharge Planning: Goal: Ability to manage health-related needs will improve Outcome: Progressing
